# Patient Record
Sex: Female | Born: 1940 | Race: White | Hispanic: No | Smoking: Never smoker
Health system: Southern US, Community
[De-identification: ages and names within clinical notes are randomized; demographics above are authoritative.]

## PROBLEM LIST (undated history)

## (undated) DIAGNOSIS — H9192 Unspecified hearing loss, left ear: Secondary | ICD-10-CM

## (undated) DIAGNOSIS — I10 Essential (primary) hypertension: Secondary | ICD-10-CM

## (undated) DIAGNOSIS — Z87442 Personal history of urinary calculi: Secondary | ICD-10-CM

## (undated) DIAGNOSIS — D696 Thrombocytopenia, unspecified: Secondary | ICD-10-CM

## (undated) DIAGNOSIS — N9489 Other specified conditions associated with female genital organs and menstrual cycle: Secondary | ICD-10-CM

## (undated) DIAGNOSIS — M1990 Unspecified osteoarthritis, unspecified site: Secondary | ICD-10-CM

## (undated) DIAGNOSIS — Z853 Personal history of malignant neoplasm of breast: Secondary | ICD-10-CM

## (undated) DIAGNOSIS — C50919 Malignant neoplasm of unspecified site of unspecified female breast: Secondary | ICD-10-CM

## (undated) DIAGNOSIS — R42 Dizziness and giddiness: Secondary | ICD-10-CM

## (undated) DIAGNOSIS — C569 Malignant neoplasm of unspecified ovary: Secondary | ICD-10-CM

## (undated) DIAGNOSIS — R200 Anesthesia of skin: Secondary | ICD-10-CM

## (undated) DIAGNOSIS — C801 Malignant (primary) neoplasm, unspecified: Secondary | ICD-10-CM

## (undated) DIAGNOSIS — E785 Hyperlipidemia, unspecified: Secondary | ICD-10-CM

## (undated) DIAGNOSIS — C786 Secondary malignant neoplasm of retroperitoneum and peritoneum: Secondary | ICD-10-CM

## (undated) DIAGNOSIS — D649 Anemia, unspecified: Secondary | ICD-10-CM

## (undated) DIAGNOSIS — E079 Disorder of thyroid, unspecified: Secondary | ICD-10-CM

## (undated) DIAGNOSIS — IMO0002 Reserved for concepts with insufficient information to code with codable children: Secondary | ICD-10-CM

## (undated) DIAGNOSIS — I739 Peripheral vascular disease, unspecified: Secondary | ICD-10-CM

## (undated) HISTORY — DX: Reserved for concepts with insufficient information to code with codable children: IMO0002

## (undated) HISTORY — DX: Personal history of malignant neoplasm of breast: Z85.3

## (undated) HISTORY — PX: MASTECTOMY: SHX3

## (undated) HISTORY — PX: FOOT SURGERY: SHX648

## (undated) HISTORY — PX: VASCULAR SURGERY: SHX849

## (undated) HISTORY — PX: OTHER SURGICAL HISTORY: SHX169

## (undated) HISTORY — PX: VARICOSE VEIN SURGERY: SHX832

## (undated) HISTORY — PX: THYROID LOBECTOMY: SHX420

## (undated) HISTORY — PX: TONSILLECTOMY: SUR1361

---

## 1978-03-07 HISTORY — PX: ABDOMINAL HYSTERECTOMY: SHX81

## 2004-05-18 ENCOUNTER — Ambulatory Visit: Payer: Self-pay | Admitting: Occupational Therapy

## 2006-10-21 ENCOUNTER — Inpatient Hospital Stay (HOSPITAL_COMMUNITY): Admission: EM | Admit: 2006-10-21 | Discharge: 2006-10-22 | Payer: Self-pay | Admitting: Emergency Medicine

## 2007-10-23 ENCOUNTER — Ambulatory Visit (HOSPITAL_COMMUNITY): Admission: RE | Admit: 2007-10-23 | Discharge: 2007-10-23 | Payer: Self-pay | Admitting: Internal Medicine

## 2010-07-20 NOTE — H&P (Signed)
NAME:  Rita Humphrey, Rita Humphrey NO.:  [id number]   MEDICAL RECORD NO.:  [id number]          PATIENT TYPE:  INP   LOCATION:  A201                          FACILITY:  [facility]   PHYSICIAN:  Skeet Latch, DO    DATE OF BIRTH:  01/14/1941   DATE OF ADMISSION:  10/21/2006  DATE OF DISCHARGE:  LH                              HISTORY & PHYSICAL   PRIMARY CARE PHYSICIAN:  Dr. Ignacia Palma, [place], [place].   CHIEF COMPLAINT:  Chest pain.   HISTORY OF PRESENT ILLNESS:  This 70 year old occasion female presents  with the sudden onset of acute left sternal chest pain.  The patient  states that this morning while feeding her dog and giving him water, she  bent been down. After standing back up, she had acute onset of  substernal 10/10 chest pain.  The patient states she felt like the pain  radiated into her left shoulder blade.  The patient states that after  this episode, the patient went back in the house, sat down, and took 2  aspirin, her blood pressure medication as well as cholesterol  medications.  The patient states that the pain continued.  She called  her sister-in-law who came to her house.  She gave her 2 nitroglycerin  tablets and states that this helped relieve her pain.  Then EMS was  called.  The patient was brought to the emergency room for evaluation.  Upon my exam, the patient states the left sternal chest pain has  subsided but continues to have left-sided shoulder and scapular pain.  The patient does have a history of chronic back pain that she states has  been present for months has been treated with muscle relaxants.  The  patient's medical history consists of hypertension and hyperlipidemia  and breast cancer.  Patient is stable.  No acute distress at this time  and is lying in bed on my exam.   PAST MEDICAL HISTORY:  Includes:  1. Breast cancer with chemotherapy, last treatment was in 1994.  2. Hypertension.  3. Hyperlipidemia.   SURGICAL HISTORY:  1.  Hysterectomy.  2. Bilateral mastectomy.  3. Tonsillectomy.  4. Leg vein stripping.   SOCIAL HISTORY:  Denies any smoking tobacco, illicit drug use.   FAMILY HISTORY:  Positive for breast cancer, myocardial infarctions,  coronary artery disease, and CVA.   DRUG ALLERGIES:  MORPHINE, PENICILLIN.   HOME MEDICATIONS:  1. Lisinopril/hydrochlorothiazide 25/12.5 mg 1 tablet p.o. daily.  2. Simvastatin 40 mg 1 tablet p.o. nightly.   REVIEW OF SYSTEMS:  GENERAL:  Denies any weight loss, weakness,  decreased appetite, chills, fatigue.  HEENT AND NECK: Denies any eye  disorders, head, neck,  or ear complaints.  CARDIOVASCULAR:  Positive  for left-sided chest pain, no palpitations.  RESPIRATORY:  Denies any  cough, dyspnea, wheezing.  GI: No nausea, vomiting, abdominal pain,  diarrhea, black stools or rectal bleeding.  GENITOURINARY: No dysuria,  urgency, frequency.  MUSCULOSKELETAL:  Chronic back pain.  No neck pain.  SKIN:  Negative. Other systems are negative.   PHYSICAL EXAMINATION:  GENERAL:  Patient is well-developed, well-  nourished, well-hydrated, no acute distress, alert and oriented.  VITAL SIGNS: Temperature is 98.3, respiration rate 18, pulse 85, blood  pressure 144-69.  HEENT:  Head is atraumatic, normocephalic.  PERRLA.  EOMI.  NECK:  Supple.  Slight prominence of the left thyroid gland. No bruits  appreciated.  CARDIOVASCULAR:  Regular rate and rhythm.  No murmurs, gallops or rubs.  RESPIRATORY:  Lungs clear to auscultation bilaterally.  No rales,  rhonchi or wheezes.  ABDOMEN:  Soft, nontender, nondistended, obese, no masses, no rebound or  guarding.  Positive bowel sounds.  EXTREMITIES:  Full range of motion.  No clubbing, cyanosis or edema.  NEUROLOGIC:  Cranial nerves II-XII are grossly intact.  The patient is  alert and oriented x3.   LABORATORY DATA:  Sodium 140, potassium 3.6, chloride 108, CO2 29,  glucose 116, BUN 16, creatinine 0.86, troponin less than 0.05.   CK-MB is  less than 1. White count 8.5, hemoglobin 14.1, hematocrit 41.2,  platelets 334.   EKG:  Normal sinus rhythm, normal axis, no ST or T wave changes.   CT scan was negative for dissection, hematoma, or aneurysm.  Shows areas  of arthrosclerosis in the aorta.  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  Consider followup with ultrasound.  Also showed fatty liver.  A small  focus of ground glass in right lower lobe, probably atelectasis or could  be inflammation or infection.   ASSESSMENT:  This is a 66-year white female who presented with acute  onset of left sternal chest pain.  The patient states while being  outside she experienced left-sided chest pain that she states feels like  radiated to her left scapular region.  The patient states that the chest  pain has subsided, but she still complaints of left scapular type  discomfort.   PLAN:  1. For atypical chest pain, the patient will be placed on aspirin and      nitroglycerin as needed, oxygen and repeat EKG in the morning. The      patient will get a set of cardiac enzymes as well as cardiology      consult.  The patient be admitted to the telemetry unit for close      observation.  2. For hypertension, this seems to be stable.  Continue home      medications.  3. For hyperlipidemia, the patient is already on a statin.  This be      continued and will check a lipid panel.  4. For chronic back pain, the patient will be placed on p.o. pain      medications.  If severe, will be given IV pain medications.  5. For history of lightheadedness and dizziness, will get carotid      Doppler studies.      Skeet Latch, DO  Electronically Signed     SM/MEDQ  D:  10/21/2006  T:  10/21/2006  Job:  [id number]   cc:   Dr. Beckey Rutter, [place]

## 2010-07-20 NOTE — Discharge Summary (Signed)
NAME:  Rita Humphrey, Rita Humphrey                  ACCOUNT NO.:  [id number]   MEDICAL RECORD NO.:  [id number]          PATIENT TYPE:  INP   LOCATION:  A201                          FACILITY:  [facility]   PHYSICIAN:  Osvaldo Shipper, MD     DATE OF BIRTH:  October 25, 1940   DATE OF ADMISSION:  10/21/2006  DATE OF DISCHARGE:  08/17/2008LH                               DISCHARGE SUMMARY   PRIMARY CARE PHYSICIAN:  Carlota Raspberry, M.D.   DISCHARGE DIAGNOSES:  1. Chest pain, likely musculoskeletal.  2. History of hypertension, stable.  3. History of breast cancer, with bilateral mastectomies in the past.  4. History of dyslipidemia.   Please review  H&P dictated by Dr. Lewis Moccasin for details regarding the  patient's presenting illness.   BRIEF HOSPITAL COURSE:  Briefly, this is a 70 year old Caucasian female  who has a history of hypertension and dyslipidemia, who was at home  yesterday morning when she bent down to serve food to her dog, and when  she stood up from the bent position she experienced sudden, sharp chest  pain in the left side.  The patient has a chronic history of back pain,  and she felt back pain as well.  This prompted her to visit her urgent  care center, who referred her to the hospital.  She presented to the  hospital here, underwent a CT scan of the chest and abdomen, which did  not show any dissection or any other problems with her major arteries.  She had an EKG which did not show any acute changes.  Cardiac markers  were negative when she presented, and they were checked again this  morning and were again negative.  BN peptide was also negative.  The  rest of the labs were unremarkable.   Her pain actually resolved with 2 nitroglycerin, which she took from her  sister's supply.   So, essentially the etiology for her pain is not really clear.  She did  mention that she had a cardiac stress test about 8-9 months ago, which  was negative.  Considering that data, cardiac etiology is less  likely.  I would favor more musculoskeletal or acid reflux disease as being the  etiology.  In any case, her pain resolved.  She has ruled out for acute  coronary syndrome.  The remainder of the workup can be done by her PMD  at this point.   Incidentally, on the chest CT she was found to have a 3.5 x 2 cm  enlarged left thyroid gland.  A thyroid ultrasound was recommended and I  am going to write the prescription for the same.  She also was found to  have a 2-3 mm nodular in the right lower lobe, for which we would  recommend a 90-month to 1-year follow-up.  Otherwise, she has done well.  Her vital signs are stable.  She is tolerating p.o. intake.  She is very  keen on going home, and so she will be discharged.   DISCHARGE MEDICATIONS:  1. Enteric-coated aspirin 81 mg daily.  2. Lisinopril/HCTZ 20/12.5 mg one  tablet daily.  3. Simvastatin 40 mg daily.   FOLLOWUP:  With her PMD within one week, to see if she needs another  referral to a cardiologist.  She will be asked to schedule the  ultrasound of the thyroid for goiter; she will be written a prescription  for the same.  Instructions written for her to inform the PMD to set her  up for a CAT scan in 6 months to one year of the lungs.   DIET:  Heart healthy diet.   PHYSICAL ACTIVITY:  No restrictions.   TOTAL TIME SPENT ON DISCHARGE:  35 minutes.      Osvaldo Shipper, MD  Electronically Signed     GK/MEDQ  D:  10/22/2006  T:  10/22/2006  Job:  [id number]   cc:   Carlota Raspberry  Fax: [phone]

## 2010-12-17 LAB — CBC
HCT: 41.2
Hemoglobin: 13.1
MCHC: 33.3
MCV: 85.2
RBC: 4.84
RDW: 13.7
WBC: 8.5

## 2010-12-17 LAB — DIFFERENTIAL
Basophils Absolute: 0
Basophils Relative: 0
Eosinophils Absolute: 0.3
Eosinophils Absolute: 0.4
Eosinophils Relative: 3
Lymphs Abs: 3.1
Monocytes Absolute: 0.5
Monocytes Absolute: 0.8 — ABNORMAL HIGH
Monocytes Relative: 6
Neutro Abs: 4.1
Neutrophils Relative %: 47

## 2010-12-17 LAB — BASIC METABOLIC PANEL
BUN: 16
CO2: 25
Calcium: 9.4
Chloride: 108
Creatinine, Ser: 0.74
GFR calc Af Amer: >60
Glucose, Bld: 96
Potassium: 3.6

## 2010-12-17 LAB — LIPID PANEL
HDL: 46
LDL Cholesterol: 87
Triglycerides: 71

## 2010-12-17 LAB — CARDIAC PANEL(CRET KIN+CKTOT+MB+TROPI): Troponin I: 0.02

## 2010-12-17 LAB — B-NATRIURETIC PEPTIDE (CONVERTED LAB): Pro B Natriuretic peptide (BNP): 33.4

## 2010-12-17 LAB — PROTIME-INR: Prothrombin Time: 12.7

## 2010-12-17 LAB — POCT CARDIAC MARKERS: Troponin i, poc: <0.05

## 2011-03-08 DIAGNOSIS — I739 Peripheral vascular disease, unspecified: Secondary | ICD-10-CM

## 2011-03-08 HISTORY — DX: Peripheral vascular disease, unspecified: I73.9

## 2011-07-10 ENCOUNTER — Emergency Department (HOSPITAL_COMMUNITY)
Admission: EM | Admit: 2011-07-10 | Discharge: 2011-07-10 | Disposition: A | Discharge status: Home / Self Care | Payer: Medicare Other | Attending: Emergency Medicine | Admitting: Emergency Medicine

## 2011-07-10 ENCOUNTER — Encounter (HOSPITAL_COMMUNITY): Payer: Self-pay | Admitting: Emergency Medicine

## 2011-07-10 DIAGNOSIS — Z853 Personal history of malignant neoplasm of breast: Secondary | ICD-10-CM | POA: Insufficient documentation

## 2011-07-10 DIAGNOSIS — E785 Hyperlipidemia, unspecified: Secondary | ICD-10-CM | POA: Insufficient documentation

## 2011-07-10 DIAGNOSIS — E079 Disorder of thyroid, unspecified: Secondary | ICD-10-CM | POA: Insufficient documentation

## 2011-07-10 DIAGNOSIS — J45909 Unspecified asthma, uncomplicated: Secondary | ICD-10-CM | POA: Insufficient documentation

## 2011-07-10 DIAGNOSIS — M79661 Pain in right lower leg: Secondary | ICD-10-CM

## 2011-07-10 DIAGNOSIS — M7989 Other specified soft tissue disorders: Secondary | ICD-10-CM | POA: Insufficient documentation

## 2011-07-10 DIAGNOSIS — I509 Heart failure, unspecified: Secondary | ICD-10-CM | POA: Insufficient documentation

## 2011-07-10 DIAGNOSIS — I10 Essential (primary) hypertension: Secondary | ICD-10-CM | POA: Insufficient documentation

## 2011-07-10 HISTORY — DX: Malignant (primary) neoplasm, unspecified: C80.1

## 2011-07-10 HISTORY — DX: Disorder of thyroid, unspecified: E07.9

## 2011-07-10 HISTORY — DX: Hyperlipidemia, unspecified: E78.5

## 2011-07-10 HISTORY — DX: Essential (primary) hypertension: I10

## 2011-07-10 MED ORDER — ENOXAPARIN SODIUM 30 MG/0.3ML ~~LOC~~ SOLN
SUBCUTANEOUS | Status: AC
  Administered 2011-07-10: 30 mg via SUBCUTANEOUS
  Filled 2011-07-10: qty 0.3

## 2011-07-10 MED ORDER — ENOXAPARIN SODIUM 60 MG/0.6ML ~~LOC~~ SOLN
1.0000 mg/kg | Freq: Once | SUBCUTANEOUS | Status: AC
  Administered 2011-07-10: 90 mg via SUBCUTANEOUS

## 2011-07-10 MED ORDER — ENOXAPARIN SODIUM 60 MG/0.6ML ~~LOC~~ SOLN
SUBCUTANEOUS | Status: AC
  Filled 2011-07-10: qty 0.6

## 2011-07-10 NOTE — ED Provider Notes (Signed)
History   This chart was scribed for Laray Anger, DO by Brooks Sailors. The patient was seen in room APA17/APA17.   CSN: [id number]  Arrival date & time 07/10/11  2048   First MD Initiated Contact with Patient 07/10/11 2106      Chief Complaint  Patient presents with  . Leg Swelling     HPI Pt seen at 2115.  Per pt, c/o gradual onset and persistence of constant right lower leg "swelling" and "pain" since yesterday.  States the pain started in her lower posterior leg, and "moved" to her lower medial leg.  Describes the pain as "throbbing."  Pt states she has a hx of "blood clots in my leg," one approx 40 years ago and another approx 25-30 years ago. Patient states she was not treated with "blood thinners" either episode.  Denies fever, no rash, no abd pain, no back pain, no CP/SOB, no focal motor weakness, no tingling/numbness in extremities.     Past Medical History  Diagnosis Date  . Cancer     [place]  . Hypertension   . Hyperlipemia   . CHF (congestive heart failure)   . Thyroid disease   . Asthma     Past Surgical History  Procedure Date  . Abdominal hysterectomy   . Mastectomy   . Vascular surgery   . Thyroid lobectomy     History  Substance Use Topics  . Smoking status: Never Smoker   . Smokeless tobacco: Not on file  . Alcohol Use: No    Review of Systems ROS: Statement: All systems negative except as marked or noted in the HPI; Constitutional: Negative for fever and chills. ; ; Eyes: Negative for eye pain, redness and discharge. ; ; ENMT: Negative for ear pain, hoarseness, nasal congestion, sinus pressure and sore throat. ; ; Cardiovascular: Negative for chest pain, palpitations, diaphoresis, dyspnea and peripheral edema. ; ; Respiratory: Negative for cough, wheezing and stridor. ; ; Gastrointestinal: Negative for nausea, vomiting, diarrhea, abdominal pain, blood in stool, hematemesis, jaundice and rectal bleeding. . ; ; Genitourinary: Negative for dysuria,  flank pain and hematuria. ; ; Musculoskeletal: Negative for back pain and neck pain. Negative for trauma. +RLE pain ; Skin: Negative for pruritus, rash, abrasions, blisters, bruising and skin lesion.; ; Neuro: Negative for headache, lightheadedness and neck stiffness. Negative for weakness, altered level of consciousness , altered mental status, extremity weakness, paresthesias, involuntary movement, seizure and syncope.     Allergies  Penicillins and Morphine and related  Home Medications  No current outpatient prescriptions on file.  BP 155/78  Pulse 103  Temp(Src) 98.2 F (36.8 C) (Oral)  Resp 20  Ht 5\' 3"  (1.6 m)  Wt 205 lb (92.987 kg)  BMI 36.31 kg/m2  SpO2 95%  Physical Exam 2120: Physical examination:  Nursing notes reviewed; Vital signs and O2 SAT reviewed;  Constitutional: Well developed, Well nourished, Well hydrated, In no acute distress; Head:  Normocephalic, atraumatic; Eyes: EOMI, PERRL, No scleral icterus; ENMT: Mouth and pharynx normal, Mucous membranes moist; Neck: Supple, Full range of motion, No lymphadenopathy; Cardiovascular: Regular rate and rhythm, No murmur, rub, or gallop; Respiratory: Breath sounds clear & equal bilaterally, No rales, rhonchi, wheezes, or rub, Normal respiratory effort/excursion; Chest: Nontender, Movement normal; Abdomen: Soft, Nontender, Nondistended, Normal bowel sounds; Extremities: Pulses normal, No tenderness, 1+ pedal edema bilat without calf edema. +multiple superficial varicose veins RLE without overlying erythema or edema.; Neuro: AA&Ox3, Major CN grossly intact. Gait steady. No gross focal  motor or sensory deficits in extremities.; Skin: Color normal, Warm, Dry, no rash.    ED Course  Procedures    MDM  MDM Reviewed: nursing note, vitals and previous chart      2145:  Pt does not describe taking any type of blood thinner after her reportedly last "2 blood clots in my legs" in the 1970's and 1980's.  States she "had my veins  stripped."  Appears to be superficial varicosity she is pointing to today.  No overlying erythema, ecchymosis or open wounds.  Will dose lovenox and obtain Vasc US in the morning to r/o DVT.  Long d/w family regarding potential dx, interim treatment, and dx testing.  Questions answered.  Verb understanding, agreeable to d/c home with outpt f/u tomorrow morning.      I personally performed the services described in this documentation, which was scribed in my presence. The recorded information has been reviewed and considered. Tipton Ballow Allison Quarry, DO 07/12/11 1604

## 2011-07-10 NOTE — ED Notes (Signed)
Pt with right leg swelling since last night.  Pt has had DVT's previously.  Pt with swelling and states it feels hot on her lower calf

## 2011-07-10 NOTE — ED Notes (Signed)
Radiology Tech in to discuss appointment time with patient for ultrasound tomorrow.

## 2011-07-11 ENCOUNTER — Ambulatory Visit (HOSPITAL_COMMUNITY)
Admit: 2011-07-11 | Discharge: 2011-07-11 | Disposition: A | Discharge status: Home / Self Care | Payer: Medicare Other | Source: Ambulatory Visit | Attending: Emergency Medicine | Admitting: Emergency Medicine

## 2011-07-11 DIAGNOSIS — I82819 Embolism and thrombosis of superficial veins of unspecified lower extremities: Secondary | ICD-10-CM | POA: Insufficient documentation

## 2011-07-11 DIAGNOSIS — M79609 Pain in unspecified limb: Secondary | ICD-10-CM | POA: Insufficient documentation

## 2011-07-11 DIAGNOSIS — M7989 Other specified soft tissue disorders: Secondary | ICD-10-CM | POA: Insufficient documentation

## 2012-05-14 ENCOUNTER — Encounter (HOSPITAL_COMMUNITY): Payer: Self-pay

## 2012-05-14 ENCOUNTER — Inpatient Hospital Stay (HOSPITAL_COMMUNITY)
Admission: EM | Admit: 2012-05-14 | Discharge: 2012-05-17 | DRG: 375 | Disposition: A | Discharge status: Home / Self Care | Payer: Medicare Other | Attending: Internal Medicine | Admitting: Internal Medicine

## 2012-05-14 ENCOUNTER — Emergency Department (HOSPITAL_COMMUNITY): Payer: Medicare Other

## 2012-05-14 ENCOUNTER — Inpatient Hospital Stay (HOSPITAL_COMMUNITY): Payer: Medicare Other

## 2012-05-14 DIAGNOSIS — R109 Unspecified abdominal pain: Secondary | ICD-10-CM | POA: Diagnosis present

## 2012-05-14 DIAGNOSIS — M545 Low back pain, unspecified: Secondary | ICD-10-CM | POA: Diagnosis present

## 2012-05-14 DIAGNOSIS — Z880 Allergy status to penicillin: Secondary | ICD-10-CM

## 2012-05-14 DIAGNOSIS — E785 Hyperlipidemia, unspecified: Secondary | ICD-10-CM | POA: Diagnosis present

## 2012-05-14 DIAGNOSIS — D75839 Thrombocytosis, unspecified: Secondary | ICD-10-CM | POA: Diagnosis present

## 2012-05-14 DIAGNOSIS — C482 Malignant neoplasm of peritoneum, unspecified: Principal | ICD-10-CM | POA: Diagnosis present

## 2012-05-14 DIAGNOSIS — N838 Other noninflammatory disorders of ovary, fallopian tube and broad ligament: Secondary | ICD-10-CM

## 2012-05-14 DIAGNOSIS — N9489 Other specified conditions associated with female genital organs and menstrual cycle: Secondary | ICD-10-CM | POA: Diagnosis present

## 2012-05-14 DIAGNOSIS — Z9010 Acquired absence of unspecified breast and nipple: Secondary | ICD-10-CM

## 2012-05-14 DIAGNOSIS — D473 Essential (hemorrhagic) thrombocythemia: Secondary | ICD-10-CM | POA: Diagnosis present

## 2012-05-14 DIAGNOSIS — J45909 Unspecified asthma, uncomplicated: Secondary | ICD-10-CM | POA: Diagnosis present

## 2012-05-14 DIAGNOSIS — M542 Cervicalgia: Secondary | ICD-10-CM | POA: Diagnosis present

## 2012-05-14 DIAGNOSIS — C786 Secondary malignant neoplasm of retroperitoneum and peritoneum: Secondary | ICD-10-CM | POA: Diagnosis present

## 2012-05-14 DIAGNOSIS — C7960 Secondary malignant neoplasm of unspecified ovary: Secondary | ICD-10-CM | POA: Diagnosis present

## 2012-05-14 DIAGNOSIS — E669 Obesity, unspecified: Secondary | ICD-10-CM | POA: Diagnosis present

## 2012-05-14 DIAGNOSIS — I10 Essential (primary) hypertension: Secondary | ICD-10-CM | POA: Diagnosis present

## 2012-05-14 DIAGNOSIS — Z853 Personal history of malignant neoplasm of breast: Secondary | ICD-10-CM

## 2012-05-14 DIAGNOSIS — Z6832 Body mass index (BMI) 32.0-32.9, adult: Secondary | ICD-10-CM

## 2012-05-14 DIAGNOSIS — Z79899 Other long term (current) drug therapy: Secondary | ICD-10-CM

## 2012-05-14 DIAGNOSIS — N839 Noninflammatory disorder of ovary, fallopian tube and broad ligament, unspecified: Secondary | ICD-10-CM

## 2012-05-14 DIAGNOSIS — Z809 Family history of malignant neoplasm, unspecified: Secondary | ICD-10-CM

## 2012-05-14 DIAGNOSIS — K5900 Constipation, unspecified: Secondary | ICD-10-CM | POA: Diagnosis present

## 2012-05-14 DIAGNOSIS — Z8249 Family history of ischemic heart disease and other diseases of the circulatory system: Secondary | ICD-10-CM

## 2012-05-14 DIAGNOSIS — Z9221 Personal history of antineoplastic chemotherapy: Secondary | ICD-10-CM

## 2012-05-14 DIAGNOSIS — R188 Other ascites: Secondary | ICD-10-CM | POA: Diagnosis present

## 2012-05-14 DIAGNOSIS — Z823 Family history of stroke: Secondary | ICD-10-CM

## 2012-05-14 DIAGNOSIS — C569 Malignant neoplasm of unspecified ovary: Secondary | ICD-10-CM | POA: Diagnosis present

## 2012-05-14 DIAGNOSIS — I509 Heart failure, unspecified: Secondary | ICD-10-CM | POA: Diagnosis present

## 2012-05-14 DIAGNOSIS — Z885 Allergy status to narcotic agent status: Secondary | ICD-10-CM

## 2012-05-14 DIAGNOSIS — R180 Malignant ascites: Secondary | ICD-10-CM | POA: Diagnosis present

## 2012-05-14 DIAGNOSIS — E876 Hypokalemia: Secondary | ICD-10-CM | POA: Diagnosis present

## 2012-05-14 DIAGNOSIS — G893 Neoplasm related pain (acute) (chronic): Secondary | ICD-10-CM | POA: Diagnosis present

## 2012-05-14 HISTORY — DX: Malignant neoplasm of unspecified site of unspecified female breast: C50.919

## 2012-05-14 HISTORY — DX: Malignant neoplasm of unspecified ovary: C56.9

## 2012-05-14 HISTORY — DX: Secondary malignant neoplasm of retroperitoneum and peritoneum: C78.6

## 2012-05-14 HISTORY — DX: Other specified conditions associated with female genital organs and menstrual cycle: N94.89

## 2012-05-14 LAB — COMPREHENSIVE METABOLIC PANEL
ALT: 16 U/L (ref 0–35)
AST: 15 U/L (ref 0–37)
Calcium: 10 mg/dL (ref 8.4–10.5)
Creatinine, Ser: 0.75 mg/dL (ref 0.50–1.10)
GFR calc non Af Amer: 83 mL/min — ABNORMAL LOW (ref >90)
Sodium: 137 mEq/L (ref 135–145)
Total Protein: 7.8 g/dL (ref 6.0–8.3)

## 2012-05-14 LAB — URINALYSIS, ROUTINE W REFLEX MICROSCOPIC
Glucose, UA: NEGATIVE mg/dL
Hgb urine dipstick: NEGATIVE
Leukocytes, UA: NEGATIVE
Protein, ur: NEGATIVE mg/dL
Specific Gravity, Urine: 1.01 (ref 1.005–1.030)
Urobilinogen, UA: 0.2 mg/dL (ref 0.0–1.0)

## 2012-05-14 LAB — CBC WITH DIFFERENTIAL/PLATELET
Basophils Absolute: 0 10*3/uL (ref 0.0–0.1)
Basophils Relative: 0 % (ref 0–1)
Eosinophils Absolute: 0.1 10*3/uL (ref 0.0–0.7)
Eosinophils Relative: 2 % (ref 0–5)
HCT: 42.2 % (ref 36.0–46.0)
MCH: 26.5 pg (ref 26.0–34.0)
MCHC: 32 g/dL (ref 30.0–36.0)
MCV: 82.9 fL (ref 78.0–100.0)
Monocytes Absolute: 0.7 10*3/uL (ref 0.1–1.0)
Platelets: 490 10*3/uL — ABNORMAL HIGH (ref 150–400)
RDW: 13.8 % (ref 11.5–15.5)
WBC: 9.3 10*3/uL (ref 4.0–10.5)

## 2012-05-14 MED ORDER — SODIUM CHLORIDE 0.9 % IV SOLN
INTRAVENOUS | Status: DC
  Administered 2012-05-14: 22:00:00 via INTRAVENOUS
  Administered 2012-05-14: 50 mL/h via INTRAVENOUS
  Administered 2012-05-15 – 2012-05-16 (×2): via INTRAVENOUS

## 2012-05-14 MED ORDER — ONDANSETRON HCL 4 MG/2ML IJ SOLN
4.0000 mg | Freq: Once | INTRAMUSCULAR | Status: AC
  Administered 2012-05-14: 4 mg via INTRAVENOUS
  Filled 2012-05-14: qty 2

## 2012-05-14 MED ORDER — FENTANYL CITRATE 0.05 MG/ML IJ SOLN
50.0000 ug | Freq: Once | INTRAMUSCULAR | Status: AC
  Administered 2012-05-14: 50 ug via INTRAVENOUS
  Filled 2012-05-14: qty 2

## 2012-05-14 MED ORDER — HYDROMORPHONE HCL PF 1 MG/ML IJ SOLN
0.5000 mg | INTRAMUSCULAR | Status: DC | PRN
  Administered 2012-05-15 – 2012-05-16 (×2): 0.5 mg via INTRAVENOUS
  Filled 2012-05-14 (×2): qty 1

## 2012-05-14 MED ORDER — SIMVASTATIN 10 MG PO TABS
5.0000 mg | ORAL_TABLET | Freq: Every day | ORAL | Status: DC
  Administered 2012-05-14 – 2012-05-16 (×3): 5 mg via ORAL
  Filled 2012-05-14: qty 2
  Filled 2012-05-14 (×3): qty 1

## 2012-05-14 MED ORDER — SODIUM CHLORIDE 0.9 % IV SOLN
1000.0000 mL | Freq: Once | INTRAVENOUS | Status: AC
  Administered 2012-05-14: 1000 mL via INTRAVENOUS

## 2012-05-14 MED ORDER — ONDANSETRON HCL 4 MG/2ML IJ SOLN: 4.0000 mg | Freq: Four times a day (QID) | INTRAMUSCULAR | Status: AC | PRN

## 2012-05-14 MED ORDER — SODIUM CHLORIDE 0.9 % IV SOLN: 250.0000 mL | INTRAVENOUS | Status: DC | PRN

## 2012-05-14 MED ORDER — SODIUM CHLORIDE 0.9 % IV SOLN: 1000.0000 mL | INTRAVENOUS | Status: DC

## 2012-05-14 MED ORDER — IOHEXOL 300 MG/ML  SOLN
100.0000 mL | Freq: Once | INTRAMUSCULAR | Status: AC | PRN
  Administered 2012-05-14: 100 mL via INTRAVENOUS

## 2012-05-14 MED ORDER — IOHEXOL 300 MG/ML  SOLN
50.0000 mL | Freq: Once | INTRAMUSCULAR | Status: AC | PRN
  Administered 2012-05-14: 50 mL via ORAL

## 2012-05-14 MED ORDER — ACETAMINOPHEN 500 MG PO TABS
1000.0000 mg | ORAL_TABLET | Freq: Four times a day (QID) | ORAL | Status: DC | PRN
  Administered 2012-05-14 – 2012-05-16 (×2): 1000 mg via ORAL
  Filled 2012-05-14 (×2): qty 2

## 2012-05-14 MED ORDER — ENOXAPARIN SODIUM 40 MG/0.4ML ~~LOC~~ SOLN
40.0000 mg | SUBCUTANEOUS | Status: DC
  Administered 2012-05-14 – 2012-05-16 (×3): 40 mg via SUBCUTANEOUS
  Filled 2012-05-14 (×3): qty 0.4

## 2012-05-14 MED ORDER — HYDROCHLOROTHIAZIDE 12.5 MG PO CAPS
12.5000 mg | ORAL_CAPSULE | Freq: Every day | ORAL | Status: DC
  Filled 2012-05-14: qty 1

## 2012-05-14 MED ORDER — SODIUM CHLORIDE 0.9 % IJ SOLN: 3.0000 mL | INTRAMUSCULAR | Status: DC | PRN

## 2012-05-14 MED ORDER — FENTANYL CITRATE 0.05 MG/ML IJ SOLN: 50.0000 ug | INTRAMUSCULAR | Status: DC | PRN

## 2012-05-14 MED ORDER — SODIUM CHLORIDE 0.9 % IJ SOLN
3.0000 mL | Freq: Two times a day (BID) | INTRAMUSCULAR | Status: DC
  Administered 2012-05-15 – 2012-05-17 (×3): 3 mL via INTRAVENOUS
  Filled 2012-05-14: qty 3

## 2012-05-14 MED ORDER — AMLODIPINE BESYLATE 5 MG PO TABS
10.0000 mg | ORAL_TABLET | Freq: Every day | ORAL | Status: DC
  Administered 2012-05-15 – 2012-05-17 (×3): 10 mg via ORAL
  Filled 2012-05-14 (×3): qty 2

## 2012-05-14 NOTE — ED Notes (Signed)
Complain of pain in lower back and stomach for two months. Denies n/v/d or fever

## 2012-05-14 NOTE — ED Provider Notes (Signed)
History    This chart was scribed for Imad Shostak Givens, MD by Charolett Bumpers, ED Scribe. The patient was seen in room APA07/APA07. Patient's care was started at 0947.   CSN: [id number]  Arrival date & time 05/14/12  0865   First MD Initiated Contact with Patient 05/14/12 [phone]      Chief Complaint  Patient presents with  . Back Pain    The history is provided by the patient. No language interpreter was used.   Rita Humphrey is a 72 y.o. female who presents to the Emergency Department complaining of intermittent, lower abdominal pain that started around November 2013. She states the episodes last 30 minutes at a time and occur multiple times daily. She reports her abdomen is sore with palpation. Her pain started after painting a room and stepped off a stool and felt something pull. She moved the furniture to paint by herself. She reports the pain is a sharp, stinging pain and is aggravated with laying on either of her sides. She states that after trying to get in with her PCP over the past 4 months, she has been seen by his NP in Feb and only had an UA that was normal. She denies any nausea, vomiting, diarrhea, fevers, dysuria, increased urinary frequency, urinary incontinence. She denies any decreased appetite and denies any changes in her pain with eating. She reports losing 10 lbs since November 2013. She also complains of lower back pain.   She has a h/o bilateral breast CA (1993, 2003), has not been followed by oncologist for the past 10 years. States they were two different types of breast cancer, the first she had chemotherapy, the second just surgery.   PCP: Dr. Ignacia Palma in [place]  Past Medical History  Diagnosis Date  . Cancer     [place]  . Hypertension   . Hyperlipemia   . CHF (congestive heart failure)   . Thyroid disease   . Asthma     Past Surgical History  Procedure Laterality Date  . Abdominal hysterectomy    . Mastectomy    . Vascular surgery    . Thyroid  lobectomy     bladder tack  No family history on file.  History  Substance Use Topics  . Smoking status: Never Smoker   . Smokeless tobacco: Not on file  . Alcohol Use: No  She smoking. She denies alcohol use.  Lives at home Lives alone  OB History   Grav Para Term Preterm Abortions TAB SAB Ect Mult Living                  Review of Systems  Constitutional: Negative for fever.  Gastrointestinal: Positive for abdominal pain. Negative for nausea, vomiting and diarrhea.  Genitourinary: Negative for dysuria and frequency.  Musculoskeletal: Positive for back pain.  All other systems reviewed and are negative.    Allergies  Penicillins and Morphine and related  Home Medications   Current Outpatient Rx  Name  Route  Sig  Dispense  Refill  . acetaminophen (TYLENOL) 500 MG tablet   Oral   Take 1,000 mg by mouth every 6 (six) hours as needed for pain.         Marland Kitchen amLODipine (NORVASC) 10 MG tablet   Oral   Take 10 mg by mouth daily.         . Cyanocobalamin (VITAMIN B 12 PO)   Oral   Take 1 tablet by mouth daily.         Marland Kitchen  hydrochlorothiazide (HYDRODIURIL) 25 MG tablet   Oral   Take 12.5 mg by mouth daily.         . Multiple Vitamins-Minerals (CENTRUM SILVER ADULT 50+ PO)   Oral   Take 1 tablet by mouth daily.         . pravastatin (PRAVACHOL) 20 MG tablet   Oral   Take 20 mg by mouth at bedtime.           Triage Vitals: BP 138/84  Pulse 105  Temp(Src) 98 F (36.7 C) (Oral)  Resp 18  Ht 5\' 3"  (1.6 m)  Wt 185 lb (83.915 kg)  BMI 32.78 kg/m2  SpO2 99%  Vital signs normal except for tachycardia   Physical Exam  Constitutional: She is oriented to person, place, and time. She appears well-developed and well-nourished.  HENT:  Head: Normocephalic and atraumatic.  Right Ear: External ear normal.  Left Ear: External ear normal.  Nose: Nose normal.  Mouth/Throat: Oropharynx is clear and moist. No oropharyngeal exudate.  Tongue dry.   Eyes:  Conjunctivae and EOM are normal. Pupils are equal, round, and reactive to light.  Neck: Normal range of motion. Neck supple. No tracheal deviation present.  Cardiovascular: Normal rate, regular rhythm and normal heart sounds.  Exam reveals no gallop and no friction rub.   No murmur heard. Pulmonary/Chest: Effort normal and breath sounds normal. No respiratory distress. She has no wheezes. She has no rales.  Abdominal: Soft. Bowel sounds are normal. She exhibits no distension. There is tenderness. There is no rebound and no guarding.  Tenderness over LUQ, RUQ and right mid abdomen. Lower abdomen and suprapubic abdomen is non-tender.   Musculoskeletal: Normal range of motion. She exhibits tenderness.  Lower lumbar tenderness.   Neurological: She is alert and oriented to person, place, and time.  Skin: Skin is warm and dry.  Psychiatric: She has a normal mood and affect. Her behavior is normal.    ED Course  Procedures (including critical care time)   Medications  0.9 %  sodium chloride infusion (not administered)    Followed by  0.9 %  sodium chloride infusion (not administered)  ondansetron (ZOFRAN) injection 4 mg (not administered)  fentaNYL (SUBLIMAZE) injection 50 mcg (not administered)     DIAGNOSTIC STUDIES: Oxygen Saturation is 99% on room air, normal by my interpretation.    COORDINATION OF CARE:  10:08-Discussed planned course of treatment with the patient including IV fluids, nausea and pain medication, CT of abdomen, blood work and UA, who is agreeable at this time.   12:38 Dr Gery Pray, Radiologist called CT results  Discussed her CT results. Pt wants to stay here, wants her doctors to be at this hospital and not in [place]. Does not need any more pain medication at this time.  States she had a PAP smear done last year. States she has had a bloated feeling and her abdomen has been getting bigger.   13:37 Dr Kerry Hough, admit to med-surg, team 2  Results for orders placed  during the hospital encounter of 05/14/12  CBC WITH DIFFERENTIAL      Result Value Range   WBC 9.3  4.0 - 10.5 K/uL   RBC 5.09  3.87 - 5.11 MIL/uL   Hemoglobin 13.5  12.0 - 15.0 g/dL   HCT 08.6  57.8 - 46.9 %   MCV 82.9  78.0 - 100.0 fL   MCH 26.5  26.0 - 34.0 pg   MCHC 32.0  30.0 - 36.0 g/dL  RDW 13.8  11.5 - 15.5 %   Platelets 490 (*) 150 - 400 K/uL   Neutrophils Relative 67  43 - 77 %   Neutro Abs 6.2  1.7 - 7.7 K/uL   Lymphocytes Relative 24  12 - 46 %   Lymphs Abs 2.2  0.7 - 4.0 K/uL   Monocytes Relative 7  3 - 12 %   Monocytes Absolute 0.7  0.1 - 1.0 K/uL   Eosinophils Relative 2  0 - 5 %   Eosinophils Absolute 0.1  0.0 - 0.7 K/uL   Basophils Relative 0  0 - 1 %   Basophils Absolute 0.0  0.0 - 0.1 K/uL  COMPREHENSIVE METABOLIC PANEL      Result Value Range   Sodium 137  135 - 145 mEq/L   Potassium 3.4 (*) 3.5 - 5.1 mEq/L   Chloride 99  96 - 112 mEq/L   CO2 28  19 - 32 mEq/L   Glucose, Bld 111 (*) 70 - 99 mg/dL   BUN 11  6 - 23 mg/dL   Creatinine, Ser 8.65  0.50 - 1.10 mg/dL   Calcium 78.4  8.4 - 69.6 mg/dL   Total Protein 7.8  6.0 - 8.3 g/dL   Albumin 3.3 (*) 3.5 - 5.2 g/dL   AST 15  0 - 37 U/L   ALT 16  0 - 35 U/L   Alkaline Phosphatase 96  39 - 117 U/L   Total Bilirubin 0.4  0.3 - 1.2 mg/dL   GFR calc non Af Amer 83 (*) >90 mL/min   GFR calc Af Amer >90  >90 mL/min  LIPASE, BLOOD      Result Value Range   Lipase 19  11 - 59 U/L  URINALYSIS, ROUTINE W REFLEX MICROSCOPIC      Result Value Range   Color, Urine YELLOW  YELLOW   APPearance CLEAR  CLEAR   Specific Gravity, Urine 1.010  1.005 - 1.030   pH 7.0  5.0 - 8.0   Glucose, UA NEGATIVE  NEGATIVE mg/dL   Hgb urine dipstick NEGATIVE  NEGATIVE   Bilirubin Urine NEGATIVE  NEGATIVE   Ketones, ur NEGATIVE  NEGATIVE mg/dL   Protein, ur NEGATIVE  NEGATIVE mg/dL   Urobilinogen, UA 0.2  0.0 - 1.0 mg/dL   Nitrite NEGATIVE  NEGATIVE   Leukocytes, UA NEGATIVE  NEGATIVE   Laboratory interpretation all normal  except mild hypokalemia   Ct Abdomen Pelvis W Contrast  05/14/2012  *RADIOLOGY REPORT*  Clinical Data: Lower abdomen pain, history right breast carcinoma many years ago  CT ABDOMEN AND PELVIS WITH CONTRAST  Technique:  Multidetector CT imaging of the abdomen and pelvis was performed following the standard protocol during bolus administration of intravenous contrast.  Contrast: 50mL OMNIPAQUE IOHEXOL 300 MG/ML  SOLN, OMNIPAQUE IOHEXOL 300 MG/ML  SOLN  Comparison: CT abdomen pelvis of 10/21/2006  Findings: There is left lower lobe opacity possibly due to atelectasis with small left pleural effusion present.  The liver enhances with no focal abnormality and no ductal dilatation is seen.  However, there is a moderate amount of ascites throughout the peritoneal cavity.  Also there are multiple soft tissue lesions noted throughout the peritoneal cavity consistent with peritoneal implants of tumor with omental caking present as well.  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  No calcified gallstones are seen.  The pancreas is normal in size and the pancreatic duct is not dilated.  The adrenal  glands and spleen are unremarkable.  The stomach is moderately distended with oral contrast.  The kidneys enhance with parapelvic cysts bilaterally.  No hydronephrosis or renal mass is seen. On delayed images the pelvocaliceal systems are unremarkable with some displacement by the parapelvic cyst, left larger than right.  The proximal ureters are normal in caliber.  The abdominal aorta is normal in caliber.  No adenopathy is seen.  Ascites is noted throughout the pelvis as well with multiple peritoneal implants of tumor.  There are prominent adnexal soft tissue masses bilaterally.  On the left the soft tissue mass in the left adnexa measures 7.0 x 4.0 cm with the right adnexal soft tissue mass measuring 3.8 x 2.4 cm.  These may represent drop metastases, versus  ovarian malignancy.   The urinary bladder is partially decompressed but no gross abnormality is seen.  Multiple rectosigmoid colonic diverticula present.  The uterus has been previously resected.  A sclerotic focus is noted in the posterior right sacrum is most likely benign in origin.  The lumbar vertebrae are in normal alignment.  IMPRESSION:  1.  Moderate amount of ascites throughout the peritoneal cavity with diffuse peritoneal implants of tumor. Omental caking is present as well. 2.  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  Multiple rectosigmoid colonic diverticula. 4.  Left lower lobe atelectasis and/or pneumonia with small left pleural effusion.   Original Report Authenticated By: Dwyane Dee, M.D.      1. Abdominal pain   2. Malignant ascites   3. Omental metastasis   4. Ovarian mass     Plan admission   Devoria Albe, MD, FACEP   MDM    I personally performed the services described in this documentation, which was scribed in my presence. The recorded information has been reviewed and considered.  Devoria Albe, MD, Armando Gang       Zaevion Parke Givens, MD 05/14/12 1352

## 2012-05-14 NOTE — H&P (Signed)
[facility] History and Physical  Rita PATERNOSTRO ION:[id number] DOB: [birth year] DOA: 05/14/2012  Referring physician:  PCP: Carlota Raspberry, MD  Specialists:   Chief Complaint:   HPI: Rita Humphrey is a very pleasant 72 y.o. female with past medical hx of breast cancer s/p bilateral mastectomy, chemotherapy 1993 and 2003, HTN, hyperlipidemia, asthma, thyroid disease who presents to [facility] ED with CC abdominal pain. Information obtained from patient. She states about 4 months ago she developed abdominal pain after moving furniture. She reports at that time it "felt like i pulled something". Pain located lower quadrants of abdomen, described as stinging and intermittent. She reports that this pain has persisted intermittently for 4 months. She reports trying to see PCP but saw NP who stated she did not have a UTI. Pt has appointment with PCP 05/26/12 for regular check up. Pt states pain is 10/10 at its worst. She states lying on her side worsens the pain. She has taken tylenol with only fair relief. She reports the pain worsened today to the point "i was doubled over" so she came to the ED. She denies vomiting but has intermittent nausea. Denies dysuria, hematuria melena. States she does suffer from occasional constipation and recently observed red blood on tissue after BM. Denies CP, palpitation,dizziness, HA. She has lost 10lbs in last 4 weeks unintentionally and believes she has less energy. Symptoms came on suddenly, have persisted and worsened. Work up in ED included CT abdomen that yielded Moderate amount of ascites throughout the peritoneal cavity with diffuse peritoneal implants of tumor. Omental caking is present as well.  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 Multiple rectosigmoid colonic diverticula. Left lower lobe atelectasis and/or pneumonia with small left pleural effusion. [facility] are asked to  admit.    Review of Systems: The patient denies anorexia, fever, vision loss, decreased hearing, hoarseness, chest pain, syncope, dyspnea on exertion, peripheral edema, balance deficits, hemoptysis, melena,  severe indigestion/heartburn, hematuria, incontinence, genital sores, muscle weakness, suspicious skin lesions, transient blindness, difficulty walking, depression,  enlarged lymph nodes, angioedema.    Past Medical History  Diagnosis Date  . Cancer     [place]rest  . Hypertension   . Hyperlipemia   . CHF (congestive heart failure)   . Thyroid disease   . Asthma   . Breast cancer     s/p bilateral mastectomy 2440,1027   Past Surgical History  Procedure Laterality Date  . Abdominal hysterectomy    . Mastectomy    . Vascular surgery    . Thyroid lobectomy     Social History:  reports that she has never smoked. She reports that she does not drink alcohol or use illicit drugs. Pt is retired Administrator, sports at [employer], lives alone and is independent with ADL's. Daughter lives next door.   Allergies  Allergen Reactions  . Penicillins Anaphylaxis and Nausea And Vomiting  . Morphine And Related Nausea And Vomiting and Rash    No family history on file. Mother deceased at 10 throat cancer. Father deceased at 53 MI. 4 sibling who collective medical hx positive for CAD/MI x2, HTN, stroke.  Prior to Admission medications   Medication Sig Start Date End Date Taking? Authorizing Provider  acetaminophen (TYLENOL) 500 MG tablet Take 1,000 mg by mouth every 6 (six) hours as needed for pain.   Yes Historical Provider, MD  amLODipine (NORVASC) 10 MG tablet Take 10 mg by mouth daily.   Yes Historical  Provider, MD  Cyanocobalamin (VITAMIN B 12 PO) Take 1 tablet by mouth daily.   Yes Historical Provider, MD  hydrochlorothiazide (HYDRODIURIL) 25 MG tablet Take 12.5 mg by mouth daily.   Yes Historical Provider, MD  Multiple Vitamins-Minerals (CENTRUM SILVER ADULT 50+ PO) Take 1 tablet by mouth daily.    Yes Historical Provider, MD  pravastatin (PRAVACHOL) 20 MG tablet Take 20 mg by mouth at bedtime.   Yes Historical Provider, MD   Physical Exam: Filed Vitals:   05/14/12 0941  BP: 138/84  Pulse: 105  Temp: 98 F (36.7 C)  TempSrc: Oral  Resp: 18  Height: 5\' 3"  (1.6 m)  Weight: 83.915 kg (185 lb)  SpO2: 99%     General:  Pleasant obese NAD  Eyes: PERRL EOMI no scleral icterus  ENT: ears clear, nose without drainage,mucus membranes mouth pink slightly dry  Neck: supple no JVD no lymphadenopathy full rom  Cardiovascular: RRR No MGR no LEE PPP  Respiratory: normal effort BSCAB no wheeze/rhonchi  Abdomen: obese, soft +BS mild tenderness diffusely to palpation no guarding, no rebounding.   Skin: warm/dry, no rash lesions  Musculoskeletal: MAE, no joint swelling/erythema,  no clubbing cyanosis  Psychiatric: somewhat teary, appropriate, cooperative, calm  Neurologic: cranial nerve II-XII intact. Speech clear facial symmetry  Labs on Admission:  Basic Metabolic Panel:  Recent Labs Lab 05/14/12 1022  NA 137  K 3.4*  CL 99  CO2 28  GLUCOSE 111*  BUN 11  CREATININE 0.75  CALCIUM 10.0   Liver Function Tests:  Recent Labs Lab 05/14/12 1022  AST 15  ALT 16  ALKPHOS 96  BILITOT 0.4  PROT 7.8  ALBUMIN 3.3*    Recent Labs Lab 05/14/12 1022  LIPASE 19   No results found for this basename: AMMONIA,  in the last 168 hours CBC:  Recent Labs Lab 05/14/12 1022  WBC 9.3  NEUTROABS 6.2  HGB 13.5  HCT 42.2  MCV 82.9  PLT 490*   Cardiac Enzymes: No results found for this basename: CKTOTAL, CKMB, CKMBINDEX, TROPONINI,  in the last 168 hours  BNP (last 3 results) No results found for this basename: PROBNP,  in the last 8760 hours CBG: No results found for this basename: GLUCAP,  in the last 168 hours  Radiological Exams on Admission: Ct Abdomen Pelvis W Contrast  05/14/2012  *RADIOLOGY REPORT*  Clinical Data: Lower abdomen pain, history right breast  carcinoma many years ago  CT ABDOMEN AND PELVIS WITH CONTRAST  Technique:  Multidetector CT imaging of the abdomen and pelvis was performed following the standard protocol during bolus administration of intravenous contrast.  Contrast: 50mL OMNIPAQUE IOHEXOL 300 MG/ML  SOLN, OMNIPAQUE IOHEXOL 300 MG/ML  SOLN  Comparison: CT abdomen pelvis of 10/21/2006  Findings: There is left lower lobe opacity possibly due to atelectasis with small left pleural effusion present.  The liver enhances with no focal abnormality and no ductal dilatation is seen.  However, there is a moderate amount of ascites throughout the peritoneal cavity.  Also there are multiple soft tissue lesions noted throughout the peritoneal cavity consistent with peritoneal implants of tumor with omental caking present as well.  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  No calcified gallstones are seen.  The pancreas is normal in size and the pancreatic duct is not dilated.  The adrenal glands and spleen are unremarkable.  The stomach is moderately distended with oral contrast.  The kidneys enhance with parapelvic cysts bilaterally.  No hydronephrosis or renal mass is seen. On delayed images the pelvocaliceal systems are unremarkable with some displacement by the parapelvic cyst, left larger than right.  The proximal ureters are normal in caliber.  The abdominal aorta is normal in caliber.  No adenopathy is seen.  Ascites is noted throughout the pelvis as well with multiple peritoneal implants of tumor.  There are prominent adnexal soft tissue masses bilaterally.  On the left the soft tissue mass in the left adnexa measures 7.0 x 4.0 cm with the right adnexal soft tissue mass measuring 3.8 x 2.4 cm.  These may represent drop metastases, versus  ovarian malignancy.  The urinary bladder is partially decompressed but no gross abnormality is seen.  Multiple rectosigmoid colonic diverticula present.  The uterus has  been previously resected.  A sclerotic focus is noted in the posterior right sacrum is most likely benign in origin.  The lumbar vertebrae are in normal alignment.  IMPRESSION:  1.  Moderate amount of ascites throughout the peritoneal cavity with diffuse peritoneal implants of tumor. Omental caking is present as well. 2.  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  Multiple rectosigmoid colonic diverticula. 4.  Left lower lobe atelectasis and/or pneumonia with small left pleural effusion.   Original Report Authenticated By: Dwyane Dee, M.D.     EKG: Independently reviewed.   Assessment/Plan Principal Problem:   Abdominal pain: likely related to adnexal mass and peritoneal ascites per CT in pt with hx breast cancer. Will admit to medsurg. Will provide pain medicine as needed. Will request onc and gyn consult. Pt oncologist in past at [facility]. Has not seen in more than 10 years. Active Problems:   Hypokalemia: mild. Likely related to decreased po intake. Will replete and recheck.     HTN (hypertension): controlled. Will continue home meds.     Hyperlipemia: will check lipid panel. Continue statin     Code Status: full code at this time Family Communication: daughter lisa at bedside Disposition Plan: home when ready   Time spent: 60 minutes  [facility]   If 7PM-7AM, please contact night-coverage [email or web address] Password [facility] 05/14/2012, 2:28 PM  Attending note:  Patient seen and examined. Agree with note as above.  Oncology and GYN have been consulted.  Will order ultrasound guided paracentesis to see if fluid can be drawn for cytology.  She is having back pain, so will order xray of lumbar spine.

## 2012-05-15 ENCOUNTER — Inpatient Hospital Stay (HOSPITAL_COMMUNITY): Payer: Medicare Other

## 2012-05-15 DIAGNOSIS — C786 Secondary malignant neoplasm of retroperitoneum and peritoneum: Secondary | ICD-10-CM

## 2012-05-15 DIAGNOSIS — G893 Neoplasm related pain (acute) (chronic): Secondary | ICD-10-CM

## 2012-05-15 DIAGNOSIS — D696 Thrombocytopenia, unspecified: Secondary | ICD-10-CM

## 2012-05-15 DIAGNOSIS — M542 Cervicalgia: Secondary | ICD-10-CM | POA: Diagnosis present

## 2012-05-15 DIAGNOSIS — M545 Low back pain: Secondary | ICD-10-CM | POA: Diagnosis present

## 2012-05-15 LAB — BASIC METABOLIC PANEL
BUN: 7 mg/dL (ref 6–23)
CO2: 27 mEq/L (ref 19–32)
Calcium: 9 mg/dL (ref 8.4–10.5)
Chloride: 104 mEq/L (ref 96–112)
Creatinine, Ser: 0.63 mg/dL (ref 0.50–1.10)
GFR calc Af Amer: >90 mL/min (ref >90)

## 2012-05-15 MED ORDER — POTASSIUM CHLORIDE CRYS ER 20 MEQ PO TBCR
40.0000 meq | EXTENDED_RELEASE_TABLET | ORAL | Status: DC
  Filled 2012-05-15: qty 2

## 2012-05-15 MED ORDER — TRAZODONE HCL 50 MG PO TABS
50.0000 mg | ORAL_TABLET | Freq: Every evening | ORAL | Status: DC | PRN
  Filled 2012-05-15: qty 1

## 2012-05-15 MED ORDER — POTASSIUM CHLORIDE CRYS ER 20 MEQ PO TBCR
40.0000 meq | EXTENDED_RELEASE_TABLET | ORAL | Status: AC
  Administered 2012-05-15 (×2): 40 meq via ORAL
  Filled 2012-05-15: qty 2

## 2012-05-15 NOTE — Consult Note (Signed)
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  She reports that her abdominal pain started 4 months ago after moving furniture.  She reports "it felt like I pulled something."  She describes the pain as localized in the lower abdomen, stinging in nature, and intermittent.  She was seen by the Nurse Practitioner at her PCP office for this discomfort and her urine was tested.  The patient reports tat she tested negative for a UTI and released from the clinic. She reports that the pain is a 10 out of 10 on a pain scale.   The history of the patient's breast cancer follows:  1. In 1993 the patient was diagnosed with right-sided breast cancer.  She was treated with mastectomy on right, chemotherapy including the "red devil"- Doxorubicin, and no radiation.  This was treated in [place].  She reports that she did not require an anti-endocrine medication following the chemotherapy.  She also reports that she had limited follow-up following her treatment.  She reports 6-12 months worth of follow-up. 2. In 2003, she has a left breast cancer that was early stage.  It was treated with a left mastectomy.  She did not receive chemotherapy or radiation therapy.  This was treated in [place], [place].  She denies being tested for genetic for BRCA1 or 2.    I personally  reviewed and went over radiographic studies with the patient.  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    The patient reports that she had a hysterectomy consisting of 1 oophorectomy and uterus.  One ovary was left behind for hormonal reasons.   US paracentesis was performed today and fluid was collected and sent for cytologic analysis.  Hopefully this will provide a diagnosis.  She reports that her abdominal pain is much improved on current pain regimen and she is comfortable.    She denies any nausea or vomiting.  She does admit to a 10 lb weight loss x 4 months.  She admits to increasing abdominal size and tightness.    She otherwise denies headaches, dizziness, double vision, fevers, chills, night sweats, nausea, vomiting, diarrhea, constipation, chest pain, heart palpations, SOB, dyspnea, urinary pain/burning/frequency, hematuria   PAST MEDICAL HISTORY:   Past Medical History  Diagnosis Date  . Hypertension   . Hyperlipemia   . CHF (congestive heart failure)   . Thyroid disease   . Asthma   . Cancer     [place]  . Breast cancer     s/p bilateral mastectomy [id number]    ALLERGIES: Allergies  Allergen Reactions  . Penicillins Anaphylaxis and Nausea And Vomiting  . Morphine And Related Nausea And Vomiting and Rash      MEDICATIONS: I have reviewed the patient's current medications.     PAST SURGICAL  HISTORY Past Surgical History  Procedure Laterality Date  . Abdominal hysterectomy    . Mastectomy    . Vascular surgery    . Thyroid lobectomy      FAMILY HISTORY: Grandparents deceased before the was born and she does not know how they passed.  Father deceased at age 31 from MI Mother deceased at age 68 from throat cancer Brother deceased at 27 heart disease Sister deceased at 31 from MI Brother deceased in 23's from Stroke Sister deceased at age 58 from gangrene following a D+C after a  miscarriage Brother deceased at age 51 from heart disease and DM Brother deceased age 19 from emphysema and CHF Sister deceased age 91 from infection and renal failure Brother deceased at age 22 from Heart disease, DM. No Aunts or uncles with malignancy.  SOCIAL HISTORY: Patient lives in [place], [place] and is originally from [place].  She completed 8th grade of school.  She worked at the [employer] and retired as a Administrator, sports.  She is widowed since 2002 when her husband passed from CHF.  She lives by herself at home.  She denies EtOH, tobacco, and illicit drug abuse.   PERFORMANCE STATUS: The patient's performance status is 1 - Symptomatic but completely ambulatory  PHYSICAL EXAM: Most Recent Vital Signs: Blood pressure 156/93, pulse 81, temperature 98 F (36.7 C), temperature source Oral, resp. rate 20, height 5\' 3"  (1.6 m), weight 185 lb (83.915 kg), SpO2 94.00%. General appearance: alert, cooperative, appears stated age and no distress Head: Normocephalic, without obvious abnormality, atraumatic Eyes: negative findings: lids and lashes normal and conjunctivae and sclerae normal Ears: difficulty hearing in left ear Neck: no adenopathy and supple, symmetrical, trachea midline Back: symmetric, no curvature. ROM normal. No CVA tenderness. Lungs: clear to auscultation bilaterally Heart: regular rate and rhythm, S1, S2 normal, no murmur, click, rub or gallop Abdomen: abnormal findings:  ascites, distended and mild tenderness in the lower abdomen, and abdominal thickness Extremities: edema 1+ nonpitting B/L LE and no redness or tenderness in the calves or thighs Skin: Skin color, texture, turgor normal. No rashes or lesions Lymph nodes: Cervical, supraclavicular, and axillary nodes normal. Neurologic: Grossly normal  LABORATORY DATA:  Results for orders placed during the hospital encounter of 05/14/12 (from the past 48 hour(s))  CBC WITH DIFFERENTIAL     Status: Abnormal   Collection  Time    05/14/12 10:22 AM      Result Value Range   WBC 9.3  4.0 - 10.5 K/uL   RBC 5.09  3.87 - 5.11 MIL/uL   Hemoglobin 13.5  12.0 - 15.0 g/dL   HCT 16.1  09.6 - 04.5 %   MCV 82.9  78.0 - 100.0 fL   MCH 26.5  26.0 - 34.0 pg   MCHC 32.0  30.0 - 36.0 g/dL   RDW 40.9  81.1 - 91.4 %   Platelets 490 (*) 150 - 400 K/uL   Neutrophils Relative 67  43 - 77 %   Neutro Abs 6.2  1.7 - 7.7 K/uL   Lymphocytes Relative 24  12 - 46 %   Lymphs Abs 2.2  0.7 - 4.0 K/uL   Monocytes Relative 7  3 - 12 %   Monocytes Absolute 0.7  0.1 - 1.0 K/uL   Eosinophils Relative 2  0 - 5 %   Eosinophils Absolute 0.1  0.0 - 0.7 K/uL   Basophils Relative 0  0 - 1 %   Basophils Absolute 0.0  0.0 -  0.1 K/uL  COMPREHENSIVE METABOLIC PANEL     Status: Abnormal   Collection Time    05/14/12 10:22 AM      Result Value Range   Sodium 137  135 - 145 mEq/L   Potassium 3.4 (*) 3.5 - 5.1 mEq/L   Chloride 99  96 - 112 mEq/L   CO2 28  19 - 32 mEq/L   Glucose, Bld 111 (*) 70 - 99 mg/dL   BUN 11  6 - 23 mg/dL   Creatinine, Ser 1.61  0.50 - 1.10 mg/dL   Calcium 09.6  8.4 - 04.5 mg/dL   Total Protein 7.8  6.0 - 8.3 g/dL   Albumin 3.3 (*) 3.5 - 5.2 g/dL   AST 15  0 - 37 U/L   ALT 16  0 - 35 U/L   Alkaline Phosphatase 96  39 - 117 U/L   Total Bilirubin 0.4  0.3 - 1.2 mg/dL   GFR calc non Af Amer 83 (*) >90 mL/min   GFR calc Af Amer >90  >90 mL/min   Comment:            The eGFR has been calculated     using the CKD EPI equation.     This calculation has not been     validated in all clinical     situations.     eGFR's persistently     <90 mL/min signify     possible Chronic Kidney Disease.  LIPASE, BLOOD     Status: None   Collection Time    05/14/12 10:22 AM      Result Value Range   Lipase 19  11 - 59 U/L  URINALYSIS, ROUTINE W REFLEX MICROSCOPIC     Status: None   Collection Time    05/14/12 10:56 AM      Result Value Range   Color, Urine YELLOW  YELLOW   APPearance CLEAR  CLEAR   Specific Gravity, Urine  1.010  1.005 - 1.030   pH 7.0  5.0 - 8.0   Glucose, UA NEGATIVE  NEGATIVE mg/dL   Hgb urine dipstick NEGATIVE  NEGATIVE   Bilirubin Urine NEGATIVE  NEGATIVE   Ketones, ur NEGATIVE  NEGATIVE mg/dL   Protein, ur NEGATIVE  NEGATIVE mg/dL   Urobilinogen, UA 0.2  0.0 - 1.0 mg/dL   Nitrite NEGATIVE  NEGATIVE   Leukocytes, UA NEGATIVE  NEGATIVE   Comment: MICROSCOPIC NOT DONE ON URINES WITH NEGATIVE PROTEIN, BLOOD, LEUKOCYTES, NITRITE, OR GLUCOSE <1000 mg/dL.  BASIC METABOLIC PANEL     Status: Abnormal   Collection Time    05/15/12  5:45 AM      Result Value Range   Sodium 141  135 - 145 mEq/L   Potassium 3.3 (*) 3.5 - 5.1 mEq/L   Chloride 104  96 - 112 mEq/L   CO2 27  19 - 32 mEq/L   Glucose, Bld 99  70 - 99 mg/dL   BUN 7  6 - 23 mg/dL   Creatinine, Ser 4.09  0.50 - 1.10 mg/dL   Calcium 9.0  8.4 - 81.1 mg/dL   GFR calc non Af Amer 87 (*) >90 mL/min   GFR calc Af Amer >90  >90 mL/min   Comment:            The eGFR has been calculated     using the CKD EPI equation.     This calculation has not been     validated in all clinical  situations.     eGFR's persistently     <90 mL/min signify     possible Chronic Kidney Disease.  MAGNESIUM     Status: None   Collection Time    05/15/12  5:45 AM      Result Value Range   Magnesium 1.9  1.5 - 2.5 mg/dL      RADIOGRAPHY: Dg Cervical Spine 2 Or 3 Views  05/15/2012  *RADIOLOGY REPORT*  Clinical Data: Right-sided neck pain.  No known injury.  CERVICAL SPINE - 2-3 VIEW  Comparison: None  Findings: Normal alignment.  Prevertebral soft tissues are normal. Disc spaces are maintained.  No fracture.  IMPRESSION: No acute or significant bony abnormality.   Original Report Authenticated By: Charlett Nose, M.D.    Dg Lumbar Spine 2-3 Views  05/15/2012  *RADIOLOGY REPORT*  Clinical Data: Low back pain 3 years, worse last night, history of bilateral breast cancer  LUMBAR SPINE - 2-3 VIEW  Comparison: None Correlation:  CT abdomen pelvis 05/14/2012   Findings: Retained contrast within colon from recent CT. This partially obscures the lumbar spine on the AP view. On lateral view, vertebral body heights appear maintained without fracture or subluxation. No gross evidence of bone destruction or spondylolysis. SI joints symmetric.  IMPRESSION: Osseous demineralization. No definite acute bony findings.   Original Report Authenticated By: Ulyses Southward, M.D.    Ct Abdomen Pelvis W Contrast  05/14/2012  *RADIOLOGY REPORT*  Clinical Data: Lower abdomen pain, history right breast carcinoma many years ago  CT ABDOMEN AND PELVIS WITH CONTRAST  Technique:  Multidetector CT imaging of the abdomen and pelvis was performed following the standard protocol during bolus administration of intravenous contrast.  Contrast: 50mL OMNIPAQUE IOHEXOL 300 MG/ML  SOLN, OMNIPAQUE IOHEXOL 300 MG/ML  SOLN  Comparison: CT abdomen pelvis of 10/21/2006  Findings: There is left lower lobe opacity possibly due to atelectasis with small left pleural effusion present.  The liver enhances with no focal abnormality and no ductal dilatation is seen.  However, there is a moderate amount of ascites throughout the peritoneal cavity.  Also there are multiple soft tissue lesions noted throughout the peritoneal cavity consistent with peritoneal implants of tumor with omental caking present as well.  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  No calcified gallstones are seen.  The pancreas is normal in size and the pancreatic duct is not dilated.  The adrenal glands and spleen are unremarkable.  The stomach is moderately distended with oral contrast.  The kidneys enhance with parapelvic cysts bilaterally.  No hydronephrosis or renal mass is seen. On delayed images the pelvocaliceal systems are unremarkable with some displacement by the parapelvic cyst, left larger than right.  The proximal ureters are normal in caliber.  The abdominal aorta is normal in caliber.   No adenopathy is seen.  Ascites is noted throughout the pelvis as well with multiple peritoneal implants of tumor.  There are prominent adnexal soft tissue masses bilaterally.  On the left the soft tissue mass in the left adnexa measures 7.0 x 4.0 cm with the right adnexal soft tissue mass measuring 3.8 x 2.4 cm.  These may represent drop metastases, versus  ovarian malignancy.  The urinary bladder is partially decompressed but no gross abnormality is seen.  Multiple rectosigmoid colonic diverticula present.  The uterus has been previously resected.  A sclerotic focus is noted in the posterior right sacrum is most likely benign in origin.  The lumbar vertebrae are  in normal alignment.  IMPRESSION:  1.  Moderate amount of ascites throughout the peritoneal cavity with diffuse peritoneal implants of tumor. Omental caking is present as well. 2.  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  Multiple rectosigmoid colonic diverticula. 4.  Left lower lobe atelectasis and/or pneumonia with small left pleural effusion.   Original Report Authenticated By: Dwyane Dee, M.D.    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  In need of fluid for cytology.  PARACENTESIS WITH ULTRASOUND GUIDANCE  Comparison:  No priors.  Procedure: Prior to beginning the procedure, the procedure was discussed with the patient and the risks, benefits and alternatives were addressed.  The patient voiced understanding of the discussion, and gave written and verbal consent to proceed.  A time- out procedure was then performed.  Ultrasound was utilized to identify a suitable entry site overlying the largest pocket of ascites in the left lower quadrant of the abdomen.  A small mark was placed on the patient's skin.  The patient was prepped and draped in the usual sterile fashion.   A small amount of 1% lidocaine was  infiltrated to provide local anesthesia.  Subsequently, a small incision was made in the patient's skin, and using direct ultrasound guidance a 7 [place] Yueh centesis needle was introduced into the peritoneal space. The needle was withdrawn leaving the centesis catheter in position. Three 60 ml syringes were filled with a straw colored ascites.  The centesis catheter was then withdrawn from the patient and a small bandage was placed on the patient's skin.  All fluid samples were sent to the laboratory for testing analysis per orders of the primary medical team.  IMPRESSION: 1.  Successful ultrasound-guided paracentesis without complications, as above.   Original Report Authenticated By: Trudie Reed, M.D.        PATHOLOGY:  Pending from Paracentesis   ASSESSMENT:  1. 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 Will await cytology results from US Paracentesis on 3/11 2. Will continue to follow as an inpatient 3. Continue pain management   All questions were answered. The patient knows to call the clinic with any problems, questions or concerns. We can certainly see the patient much sooner if necessary.  The patient and plan discussed with [name], MD and he is in agreement with the aforementioned.  KEFALAS,THOMAS

## 2012-05-15 NOTE — Clinical Social Work Psychosocial (Signed)
Clinical Social Work Department BRIEF PSYCHOSOCIAL ASSESSMENT 05/15/2012  Patient:  Rita Humphrey, Rita Humphrey     Account Number:  [id number]     Admit date:  05/14/2012  Clinical Social Worker:  Nancie Neas  Date/Time:  05/15/2012 12:30 PM  Referred by:  Physician  Date Referred:  05/15/2012 Referred for  Advanced Directives   Other Referral:   Interview type:  Patient Other interview type:   and daughter- Rita Humphrey    PSYCHOSOCIAL DATA Living Status:  ALONE Admitted from facility:   Level of care:   Primary support name:  Rita Humphrey Primary support relationship to patient:  CHILD, ADULT Degree of support available:   supportive    CURRENT CONCERNS Current Concerns  None Noted   Other Concerns:    SOCIAL WORK ASSESSMENT / PLAN CSW met with pt at bedside following referral from MD for advance directives. Pt alert and oriented. She lives alone and has good family support, particularly from her daughter Rita Humphrey who lives next door. Pt is completely independent and enjoys doing lawn care. She reports she has had cancer and has been told she has a pelvic mass and is awaiting further testing. CSW provided support. Pt requesting to complete advance directives. CSW explained living will and HCPOA and pt completed both. She was given original and a copy and a copy was placed on chart.   Assessment/plan status:  No Further Intervention Required Other assessment/ plan:   Information/referral to community resources:   Possible Chaplain?    PATIENT'S/FAMILY'S RESPONSE TO PLAN OF CARE: Pt is waiting on results from consults today. She is concerned about these right now and may be open to speaking to chaplain after. CSW signing off as no further needs reported.        Derenda Fennel, [place] [phone]

## 2012-05-15 NOTE — Progress Notes (Signed)
INITIAL NUTRITION ASSESSMENT  DOCUMENTATION CODES Per approved criteria  -Obesity Class II   INTERVENTION: RD will continue to follow for nutrition care needs  NUTRITION DIAGNOSIS: None at this time  Goal: Pt to meet >/= 90% of their estimated nutrition needs  Monitor:  Meals: percentage intake , I/O's, labs and wt trends  Reason for Assessment: Malnutrition Screen  72 y.o. female  Admitting Dx: Abdominal pain  ASSESSMENT: Pt reports recent decreased po intake related to abdominal pain. Prior to that her po intake was good. She has experienced 20# wt loss 10% since May 2013 which is not clinically significant. Ascites most likely masking additional wt decrease. Radiology notes today 180 ml abdominal fluid removed for lab evaluation. Will follow for wt changes and po intake. Pt has hx of edema bilateral lower extremities which may also skew actual wt changes.  Pt does not meet criteria for malnutrition at this time.   Height: Ht Readings from Last 1 Encounters:  05/14/12 5\' 3"  (1.6 m)    Weight: Wt Readings from Last 1 Encounters:  05/14/12 185 lb (83.915 kg)    Ideal Body Weight: 115# (52.2kg)  % Ideal Body Weight: 161%  Wt Readings from Last 10 Encounters:  05/14/12 185 lb (83.915 kg)  07/10/11 205 lb (92.987 kg)    Usual Body Weight: 205#  % Usual Body Weight: 90%  BMI:  Body mass index is 32.78 kg/(m^2).Obesity Class II  Estimated Nutritional Needs: Kcal: 1300-1560 Protein: 78-94 gr Fluid: > 2000 ml/day  Skin: no issues noted  Diet Order: General  EDUCATION NEEDS: -No education needs identified at this time   Intake/Output Summary (Last 24 hours) at 05/15/12 1118 Last data filed at 05/14/12 1800  Gross per 24 hour  Intake    240 ml  Output      0 ml  Net    240 ml    Last BM: PTA  Labs:   Recent Labs Lab 05/14/12 1022 05/15/12 0545  NA 137 141  K 3.4* 3.3*  CL 99 104  CO2 28 27  BUN 11 7  CREATININE 0.75 0.63  CALCIUM 10.0  9.0  MG  --  1.9  GLUCOSE 111* 99    CBG (last 3)  No results found for this basename: GLUCAP,  in the last 72 hours  Scheduled Meds: . amLODipine  10 mg Oral Daily  . enoxaparin (LOVENOX) injection  40 mg Subcutaneous Q24H  . potassium chloride  40 mEq Oral Q4H  . simvastatin  5 mg Oral q1800  . sodium chloride  3 mL Intravenous Q12H    Continuous Infusions: . sodium chloride 50 mL/hr at 05/14/12 2216    Past Medical History  Diagnosis Date  . Hypertension   . Hyperlipemia   . CHF (congestive heart failure)   . Thyroid disease   . Asthma   . Cancer     [place]  . Breast cancer     s/p bilateral mastectomy [id number]    Past Surgical History  Procedure Laterality Date  . Abdominal hysterectomy    . Mastectomy    . Vascular surgery    . Thyroid lobectomy      Royann Shivers MS,RD,LDN,CSG Office: #[phone] Pager: [phone]

## 2012-05-15 NOTE — Progress Notes (Signed)
Lidocaine 1%          3mL injected                        abdominal fluid removed for lab evaluation

## 2012-05-15 NOTE — Progress Notes (Signed)
[facility] PROGRESS NOTE  Rita Humphrey RUE:[id number] DOB: 1941/01/10 DOA: 05/14/2012 PCP: Carlota Raspberry, MD  Assessment/Plan: Abdominal pain: likely related to adnexal mass and peritoneal ascites per CT in pt with hx breast cancer. Tylenol adequately managing pain. Await US guided paracentesis for cytology. Await onc and gyn consult.    Active Problems:  Low back pain. Pt reports relationship between low back pain and abdominal pain in terms of course. Lumbar xray yields no bony abnormalities.   Neck pain: reports developed 1 week ago. No numbness tingling of upper extremities. No weakness. Mildly tender. Will get cervical xray  Hypokalemia: mild. Likely related to decreased po intake and HCTZ. Continues to be at low end of range. Will hold HCTZ for now, replete and recheck. Will check mag level.    HTN (hypertension): controlled. Holding HCTZ for now due to #4. Continue norvasc   Hyperlipemia:  Continue statin     Code Status: full Family Communication: daughters at bedside Disposition Plan: home when ready.   Consultants:  OB-GYN  Oncology  Procedures:  05/15/12 Parcentesis  Antibiotics:  none  HPI/Subjective: Sitting on side of bed visiting with family  Objective: Filed Vitals:   05/14/12 1447 05/14/12 1643 05/14/12 2116 05/15/12 0513  BP: 136/83 147/84 137/73 104/64  Pulse: 98 112 95 81  Temp:  97.6 F (36.4 C) 98.3 F (36.8 C) 98 F (36.7 C)  TempSrc:  Oral    Resp: 20 20 20 20   Height:  5\' 3"  (1.6 m)    Weight:  83.915 kg (185 lb)    SpO2: 95% 92% 94% 94%    Intake/Output Summary (Last 24 hours) at 05/15/12 1009 Last data filed at 05/14/12 1800  Gross per 24 hour  Intake    240 ml  Output      0 ml  Net    240 ml   Filed Weights   05/14/12 0941 05/14/12 1643  Weight: 83.915 kg (185 lb) 83.915 kg (185 lb)    Exam:   General:  Alert obese NAD  Cardiovascular: RRR No MGR No LEE no clubbing/cyanosis  Respiratory: normal effort  BSCTAB . No wheeze, no rhonchi  Abdomen: obese +BS non-tender to palpation  Musculoskeletal: MAE. Neck with full ROM, mild tenderness to palpation. Joints without tenderness/erythema   Data Reviewed: Basic Metabolic Panel:  Recent Labs Lab 05/14/12 1022 05/15/12 0545  NA 137 141  K 3.4* 3.3*  CL 99 104  CO2 28 27  GLUCOSE 111* 99  BUN 11 7  CREATININE 0.75 0.63  CALCIUM 10.0 9.0   Liver Function Tests:  Recent Labs Lab 05/14/12 1022  AST 15  ALT 16  ALKPHOS 96  BILITOT 0.4  PROT 7.8  ALBUMIN 3.3*    Recent Labs Lab 05/14/12 1022  LIPASE 19   No results found for this basename: AMMONIA,  in the last 168 hours CBC:  Recent Labs Lab 05/14/12 1022  WBC 9.3  NEUTROABS 6.2  HGB 13.5  HCT 42.2  MCV 82.9  PLT 490*   Cardiac Enzymes: No results found for this basename: CKTOTAL, CKMB, CKMBINDEX, TROPONINI,  in the last 168 hours BNP (last 3 results) No results found for this basename: PROBNP,  in the last 8760 hours CBG: No results found for this basename: GLUCAP,  in the last 168 hours  No results found for this or any previous visit (from the past 240 hour(s)).   Studies: Dg Lumbar Spine 2-3 Views  05/15/2012  *RADIOLOGY REPORT*  Clinical  Data: Low back pain 3 years, worse last night, history of bilateral breast cancer  LUMBAR SPINE - 2-3 VIEW  Comparison: None Correlation:  CT abdomen pelvis 05/14/2012  Findings: Retained contrast within colon from recent CT. This partially obscures the lumbar spine on the AP view. On lateral view, vertebral body heights appear maintained without fracture or subluxation. No gross evidence of bone destruction or spondylolysis. SI joints symmetric.  IMPRESSION: Osseous demineralization. No definite acute bony findings.   Original Report Authenticated By: Ulyses Southward, M.D.    Ct Abdomen Pelvis W Contrast  05/14/2012  *RADIOLOGY REPORT*  Clinical Data: Lower abdomen pain, history right breast carcinoma many years ago  CT ABDOMEN  AND PELVIS WITH CONTRAST  Technique:  Multidetector CT imaging of the abdomen and pelvis was performed following the standard protocol during bolus administration of intravenous contrast.  Contrast: 50mL OMNIPAQUE IOHEXOL 300 MG/ML  SOLN, OMNIPAQUE IOHEXOL 300 MG/ML  SOLN  Comparison: CT abdomen pelvis of 10/21/2006  Findings: There is left lower lobe opacity possibly due to atelectasis with small left pleural effusion present.  The liver enhances with no focal abnormality and no ductal dilatation is seen.  However, there is a moderate amount of ascites throughout the peritoneal cavity.  Also there are multiple soft tissue lesions noted throughout the peritoneal cavity consistent with peritoneal implants of tumor with omental caking present as well.  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  No calcified gallstones are seen.  The pancreas is normal in size and the pancreatic duct is not dilated.  The adrenal glands and spleen are unremarkable.  The stomach is moderately distended with oral contrast.  The kidneys enhance with parapelvic cysts bilaterally.  No hydronephrosis or renal mass is seen. On delayed images the pelvocaliceal systems are unremarkable with some displacement by the parapelvic cyst, left larger than right.  The proximal ureters are normal in caliber.  The abdominal aorta is normal in caliber.  No adenopathy is seen.  Ascites is noted throughout the pelvis as well with multiple peritoneal implants of tumor.  There are prominent adnexal soft tissue masses bilaterally.  On the left the soft tissue mass in the left adnexa measures 7.0 x 4.0 cm with the right adnexal soft tissue mass measuring 3.8 x 2.4 cm.  These may represent drop metastases, versus  ovarian malignancy.  The urinary bladder is partially decompressed but no gross abnormality is seen.  Multiple rectosigmoid colonic diverticula present.  The uterus has been previously resected.  A  sclerotic focus is noted in the posterior right sacrum is most likely benign in origin.  The lumbar vertebrae are in normal alignment.  IMPRESSION:  1.  Moderate amount of ascites throughout the peritoneal cavity with diffuse peritoneal implants of tumor. Omental caking is present as well. 2.  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  Multiple rectosigmoid colonic diverticula. 4.  Left lower lobe atelectasis and/or pneumonia with small left pleural effusion.   Original Report Authenticated By: Dwyane Dee, M.D.     Scheduled Meds: . amLODipine  10 mg Oral Daily  . enoxaparin (LOVENOX) injection  40 mg Subcutaneous Q24H  . hydrochlorothiazide  12.5 mg Oral Daily  . potassium chloride  40 mEq Oral Q4H  . simvastatin  5 mg Oral q1800  . sodium chloride  3 mL Intravenous Q12H   Continuous Infusions: . sodium chloride 50 mL/hr at 05/14/12 2216  Principal Problem:   Abdominal pain Active Problems:   Hypokalemia   HTN (hypertension)   Hyperlipemia    Time spent: 30 minutes    [facility] M  [facility]  If 7PM-7AM, please contact night-coverage at [email or web address], password [facility] 05/15/2012, 10:09 AM  LOS: 1 day   Attending note:  Patient seen and examined.  Agree with note as above.  Cytology from paracentesis is pending.  Await input from oncology and GYN consults.

## 2012-05-16 DIAGNOSIS — R188 Other ascites: Secondary | ICD-10-CM | POA: Diagnosis present

## 2012-05-16 DIAGNOSIS — N9489 Other specified conditions associated with female genital organs and menstrual cycle: Secondary | ICD-10-CM

## 2012-05-16 DIAGNOSIS — K5900 Constipation, unspecified: Secondary | ICD-10-CM | POA: Diagnosis present

## 2012-05-16 HISTORY — DX: Other specified conditions associated with female genital organs and menstrual cycle: N94.89

## 2012-05-16 LAB — GLUCOSE, CAPILLARY: Glucose-Capillary: 98 mg/dL (ref 70–99)

## 2012-05-16 LAB — BASIC METABOLIC PANEL
BUN: 9 mg/dL (ref 6–23)
CO2: 25 mEq/L (ref 19–32)
Chloride: 108 mEq/L (ref 96–112)
Creatinine, Ser: 0.59 mg/dL (ref 0.50–1.10)

## 2012-05-16 MED ORDER — SENNOSIDES-DOCUSATE SODIUM 8.6-50 MG PO TABS
1.0000 | ORAL_TABLET | Freq: Two times a day (BID) | ORAL | Status: DC
  Administered 2012-05-16 (×2): 1 via ORAL
  Filled 2012-05-16 (×2): qty 1

## 2012-05-16 MED ORDER — MAGNESIUM HYDROXIDE 400 MG/5ML PO SUSP: 30.0000 mL | Freq: Every day | ORAL | Status: DC | PRN

## 2012-05-16 NOTE — Care Management Note (Signed)
    Page 1 of 1   05/17/2012     12:24:24 PM   CARE MANAGEMENT NOTE 05/17/2012  Patient:  Rita Humphrey, Rita Humphrey   Account Number:  [id number]  Date Initiated:  05/16/2012  Documentation initiated by:  Rosemary Holms  Subjective/Objective Assessment:   Pt admitted with abdominal/back pain. Spoke to pt and her daughters were at the bedside. Plans to return home. No HH needs anticipated or identified.     Action/Plan:   Anticipated DC Date:  05/17/2012   Anticipated DC Plan:  HOME/SELF CARE      DC Planning Services  CM consult      Choice offered to / List presented to:             Status of service:  Completed, signed off Medicare Important Message given?  YES (If response is "NO", the following Medicare IM given date fields will be blank) Date Medicare IM given:  05/17/2012 Date Additional Medicare IM given:    Discharge Disposition:  HOME/SELF CARE  Per UR Regulation:    If discussed at Long Length of Stay Meetings, dates discussed:    Comments:  05/16/12 Rosemary Holms RN BSN CM

## 2012-05-16 NOTE — Progress Notes (Signed)
[facility] PROGRESS NOTE  CHARDONAY SCRITCHFIELD WUJ:[id number] DOB: [birth year] DOA: 05/14/2012 PCP: Carlota Raspberry, MD  Assessment/Plan: Abdominal pain: likely related to adnexal mass and peritoneal ascites per CT in pt with hx breast cancer. Tylenol continues to adequately manage pain. Await cytology from  US guided paracentesis on 05/15/12. Appreciate oncology assistance. Also spoke with Dr. Emelda Fear from Gyn who indicated he has spoken to oncology regarding patient and oncology will contact him and/or coordinate referrals. Confirmed with Tom PA for oncology.   Active Problems:  Adnexal mass: per CT. Oncology coordinating care with GYN.   Peritoneal ascites: s/p paracentesis. removed 05/15/12. Await cytology. See #1. Appreciate oncology assistance.   Low back pain. Pt reports relationship between low back pain and abdominal pain in terms of course. Lumbar xray yields no bony abnormalities. Improved somewhat. Continue with tylenol  Neck pain: reports developed 1 week ago. No numbness tingling of upper extremities. No weakness. Mildly tender. Cervical xray neg for significant bony abnormality.   Hypokalemia: mild. Likely related to decreased po intake and HCTZ. Resolved this am. Will continue to hold HCTZ for now. Monitor BP. Mag level 1.9. Bmet in am.   HTN (hypertension): controlled. Holding HCTZ for now due to #4. Continue norvasc   Constipation: pt reports no BM 3 days. States chronically constipated. Will provide stool softener and MOM prn.   Hyperlipemia: Continue statin    Code Status: full Family Communication: daughter at bedside Disposition Plan: home when ready    Consultants:  Oncology  Procedures:  Paracentesis 05/15/12>>>  Antibiotics:  none  HPI/Subjective: Lying in bed visiting with family. Reports slight worsening of pain and wonders if related to constipation.    Objective: Filed Vitals:   05/15/12 0513 05/15/12 1030 05/15/12 2210 05/16/12 0526  BP:  104/64 156/93 130/65 123/70  Pulse: 81  94 87  Temp: 98 F (36.7 C)  98 F (36.7 C) 98 F (36.7 C)  TempSrc:   Oral   Resp: 20  20 18   Height:      Weight:      SpO2: 94%  95% 97%    Intake/Output Summary (Last 24 hours) at 05/16/12 1315 Last data filed at 05/16/12 0900  Gross per 24 hour  Intake    480 ml  Output      0 ml  Net    480 ml   Filed Weights   05/14/12 0941 05/14/12 1643  Weight: 83.915 kg (185 lb) 83.915 kg (185 lb)    Exam:   General:  Obese 72 year old Caucasian woman laying in bed, in no acute distress.   Cardiovascular: S1, S2, with a 1-2/6 systolic murmur.  Respiratory: normal effort CTA bilaterally. No rhonchi, no wheeze  Abdomen: obese, positive bowel sounds, soft, mild tenderness to palpation in lower quadrants.   Musculoskeletal: moves all extremities, no joint swelling/erythema   Data Reviewed: Basic Metabolic Panel:  Recent Labs Lab 05/14/12 1022 05/15/12 0545 05/16/12 0554  NA 137 141 140  K 3.4* 3.3* 3.7  CL 99 104 108  CO2 28 27 25   GLUCOSE 111* 99 99  BUN 11 7 9   CREATININE 0.75 0.63 0.59  CALCIUM 10.0 9.0 8.8  MG  --  1.9  --    Liver Function Tests:  Recent Labs Lab 05/14/12 1022  AST 15  ALT 16  ALKPHOS 96  BILITOT 0.4  PROT 7.8  ALBUMIN 3.3*    Recent Labs Lab 05/14/12 1022  LIPASE 19   No results found for  this basename: AMMONIA,  in the last 168 hours CBC:  Recent Labs Lab 05/14/12 1022  WBC 9.3  NEUTROABS 6.2  HGB 13.5  HCT 42.2  MCV 82.9  PLT 490*   Cardiac Enzymes: No results found for this basename: CKTOTAL, CKMB, CKMBINDEX, TROPONINI,  in the last 168 hours BNP (last 3 results) No results found for this basename: PROBNP,  in the last 8760 hours CBG:  Recent Labs Lab 05/16/12 0800 05/16/12 1149  GLUCAP 102* 98    No results found for this or any previous visit (from the past 240 hour(s)).   Studies: Dg Cervical Spine 2 Or 3 Views  05/15/2012  *RADIOLOGY REPORT*  Clinical  Data: Right-sided neck pain.  No known injury.  CERVICAL SPINE - 2-3 VIEW  Comparison: None  Findings: Normal alignment.  Prevertebral soft tissues are normal. Disc spaces are maintained.  No fracture.  IMPRESSION: No acute or significant bony abnormality.   Original Report Authenticated By: Charlett Nose, M.D.    Dg Lumbar Spine 2-3 Views  05/15/2012  *RADIOLOGY REPORT*  Clinical Data: Low back pain 3 years, worse last night, history of bilateral breast cancer  LUMBAR SPINE - 2-3 VIEW  Comparison: None Correlation:  CT abdomen pelvis 05/14/2012  Findings: Retained contrast within colon from recent CT. This partially obscures the lumbar spine on the AP view. On lateral view, vertebral body heights appear maintained without fracture or subluxation. No gross evidence of bone destruction or spondylolysis. SI joints symmetric.  IMPRESSION: Osseous demineralization. No definite acute bony findings.   Original Report Authenticated By: Ulyses Southward, M.D.    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  In need of fluid for cytology.  PARACENTESIS WITH ULTRASOUND GUIDANCE  Comparison:  No priors.  Procedure: Prior to beginning the procedure, the procedure was discussed with the patient and the risks, benefits and alternatives were addressed.  The patient voiced understanding of the discussion, and gave written and verbal consent to proceed.  A time- out procedure was then performed.  Ultrasound was utilized to identify a suitable entry site overlying the largest pocket of ascites in the left lower quadrant of the abdomen.  A small mark was placed on the patient's skin.  The patient was prepped and draped in the usual sterile fashion.   A small amount of 1% lidocaine was infiltrated to provide local anesthesia.  Subsequently, a small incision was made in the patient's skin, and using direct ultrasound guidance a 7 [place] Yueh centesis needle was  introduced into the peritoneal space. The needle was withdrawn leaving the centesis catheter in position. Three 60 ml syringes were filled with a straw colored ascites.  The centesis catheter was then withdrawn from the patient and a small bandage was placed on the patient's skin.  All fluid samples were sent to the laboratory for testing analysis per orders of the primary medical team.  IMPRESSION: 1.  Successful ultrasound-guided paracentesis without complications, as above.   Original Report Authenticated By: Trudie Reed, M.D.     Scheduled Meds: . amLODipine  10 mg Oral Daily  . enoxaparin (LOVENOX) injection  40 mg Subcutaneous Q24H  . senna-docusate  1 tablet Oral BID  . simvastatin  5 mg Oral q1800  . sodium chloride  3 mL Intravenous Q12H   Continuous Infusions: . sodium chloride 50 mL/hr at 05/16/12 1126    Principal Problem:   Abdominal pain Active Problems:   Hypokalemia  HTN (hypertension)   Hyperlipemia   Low back pain   Neck pain   Ascites   Adnexal mass    Time spent: 35 minutes    [facility] M  [facility]  If 7PM-7AM, please contact night-coverage at [email or web address], password [facility] 05/16/2012, 1:15 PM  LOS: 2 days      Attending: The patient was seen and examined. She was discussed with NP, Ms. Vedia Coffer. The above note has been annotated accordingly. Agree with findings and assessment. Will await the results of the pathology report. We'll follow oncology and GYN's recommendations. If the results are not available in the next 24-48 hours, we'll consider discharging the patient to home and inform her of the results following discharge. We will order an increase in activity and ambulation. Of note, the patient says that she is "tired of laying in bed". We'll saline lock her IV.      Elliot Cousin, M.D.

## 2012-05-16 NOTE — Progress Notes (Signed)
Subjective: Patient seen and examined by Dr. Mariel Sleet and myself.  HPI reviewed with patient  Chart is reviewed  CT scans reviewed with radiologist.  Patient reports that abdomen started hurting in Thanksgiving.  She was seen by her GI physician who performed a colonoscopy because her screening test was due.  It was negative.  I personally reviewed and went over radiographic studies with the patient and family.   Objective: Vital signs in last 24 hours: Temp:  [98 F (36.7 C)-98.5 F (36.9 C)] 98.5 F (36.9 C) (03/12 1438) Pulse Rate:  [87-100] 100 (03/12 1438) Resp:  [18-20] 20 (03/12 1438) BP: (123-136)/(65-78) 136/78 mmHg (03/12 1438) SpO2:  [93 %-97 %] 93 % (03/12 1438)  Intake/Output from previous day: 03/11 0800 - 03/12 0759 In: 1083.3 [P.O.:240; I.V.:843.3] Out: -  Intake/Output this shift: Total I/O In: 480 [P.O.:480] Out: -   GI: abnormal findings:  distended, sense of fluid, thickened Neck: Trachea midline, no lymphadenopathy  Lab Results:   Recent Labs  05/14/12 1022  WBC 9.3  HGB 13.5  HCT 42.2  PLT 490*   BMET  Recent Labs  05/15/12 0545 05/16/12 0554  NA 141 140  K 3.3* 3.7  CL 104 108  CO2 27 25  GLUCOSE 99 99  BUN 7 9  CREATININE 0.63 0.59  CALCIUM 9.0 8.8    Studies/Results: Dg Cervical Spine 2 Or 3 Views  05/15/2012  *RADIOLOGY REPORT*  Clinical Data: Right-sided neck pain.  No known injury.  CERVICAL SPINE - 2-3 VIEW  Comparison: None  Findings: Normal alignment.  Prevertebral soft tissues are normal. Disc spaces are maintained.  No fracture.  IMPRESSION: No acute or significant bony abnormality.   Original Report Authenticated By: Charlett Nose, M.D.    Dg Lumbar Spine 2-3 Views  05/15/2012  *RADIOLOGY REPORT*  Clinical Data: Low back pain 3 years, worse last night, history of bilateral breast cancer  LUMBAR SPINE - 2-3 VIEW  Comparison: None Correlation:  CT abdomen pelvis 05/14/2012  Findings: Retained contrast within colon from  recent CT. This partially obscures the lumbar spine on the AP view. On lateral view, vertebral body heights appear maintained without fracture or subluxation. No gross evidence of bone destruction or spondylolysis. SI joints symmetric.  IMPRESSION: Osseous demineralization. No definite acute bony findings.   Original Report Authenticated By: Ulyses Southward, M.D.    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  In need of fluid for cytology.  PARACENTESIS WITH ULTRASOUND GUIDANCE  Comparison:  No priors.  Procedure: Prior to beginning the procedure, the procedure was discussed with the patient and the risks, benefits and alternatives were addressed.  The patient voiced understanding of the discussion, and gave written and verbal consent to proceed.  A time- out procedure was then performed.  Ultrasound was utilized to identify a suitable entry site overlying the largest pocket of ascites in the left lower quadrant of the abdomen.  A small mark was placed on the patient's skin.  The patient was prepped and draped in the usual sterile fashion.   A small amount of 1% lidocaine was infiltrated to provide local anesthesia.  Subsequently, a small incision was made in the patient's skin, and using direct ultrasound guidance a 7 [place] Yueh centesis needle was introduced into the peritoneal space. The needle was withdrawn leaving the centesis catheter in position. Three 60 ml syringes were filled with a straw colored ascites.  The centesis catheter was  then withdrawn from the patient and a small bandage was placed on the patient's skin.  All fluid samples were sent to the laboratory for testing analysis per orders of the primary medical team.  IMPRESSION: 1.  Successful ultrasound-guided paracentesis without complications, as above.   Original Report Authenticated By: Trudie Reed, M.D.     Medications: I have reviewed the patient's current  medications.  Assessment/Plan: 1. 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 Abdominal pain, well controlled with current pain regimen 3. Thrombocytosis, reactive  4. Hypokalemia, repleted  5. History of breast cancer in 1993 and 2003  6. HTN  7. Hyperlipidemia  8. Asthma  9. Thyroid disease  Patient and plan discussed with Dr. [name] and he is in agreement with the aforementioned.     LOS: 2 days    Rita Humphrey 05/16/2012

## 2012-05-16 NOTE — Progress Notes (Signed)
UR Chart Review Completed  

## 2012-05-17 ENCOUNTER — Encounter (HOSPITAL_COMMUNITY): Payer: Self-pay | Admitting: Internal Medicine

## 2012-05-17 DIAGNOSIS — Z853 Personal history of malignant neoplasm of breast: Secondary | ICD-10-CM

## 2012-05-17 DIAGNOSIS — C786 Secondary malignant neoplasm of retroperitoneum and peritoneum: Secondary | ICD-10-CM | POA: Diagnosis present

## 2012-05-17 DIAGNOSIS — C569 Malignant neoplasm of unspecified ovary: Secondary | ICD-10-CM | POA: Diagnosis present

## 2012-05-17 HISTORY — DX: Secondary malignant neoplasm of retroperitoneum and peritoneum: C78.6

## 2012-05-17 HISTORY — DX: Malignant neoplasm of unspecified ovary: C56.9

## 2012-05-17 LAB — BASIC METABOLIC PANEL
GFR calc Af Amer: >90 mL/min (ref >90)
GFR calc non Af Amer: >90 mL/min (ref >90)
Potassium: 3.9 mEq/L (ref 3.5–5.1)
Sodium: 139 mEq/L (ref 135–145)

## 2012-05-17 MED ORDER — ACETAMINOPHEN 500 MG PO TABS: 500.0000 mg | ORAL_TABLET | Freq: Four times a day (QID) | ORAL | Status: DC | PRN

## 2012-05-17 MED ORDER — HYDROCODONE-ACETAMINOPHEN 5-325 MG PO TABS: 1.0000 | ORAL_TABLET | Freq: Four times a day (QID) | ORAL | Status: DC | PRN

## 2012-05-17 MED ORDER — SENNOSIDES-DOCUSATE SODIUM 8.6-50 MG PO TABS: 1.0000 | ORAL_TABLET | Freq: Two times a day (BID) | ORAL | Status: DC

## 2012-05-17 MED ORDER — MAGNESIUM HYDROXIDE 400 MG/5ML PO SUSP: 30.0000 mL | Freq: Every day | ORAL | Status: DC | PRN

## 2012-05-17 NOTE — Progress Notes (Signed)
Subjective: Spoke with Dr. [name] (pathologist).  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  He is unsure of the primary at this time.  I informed him of her past history of breast cancer and he will perform some more immunohistochemistry testing.  Those results should be reported tomorrow AM.  With her cytology demonstrating adenocarcinoma, her clinical presentation, and CT images, we should presume that this is ovarian carcinoma versus primary peritoneal, keeping in mind the potential for breast cancer.   With this information, I have contacted [facility] in [place].  I presented the case to Telford Nab.  She has made the patient an appointment to be seen by Dr. [name] on 3/20 at 9 AM with an 8:30 AM arrival.  I printed this appointment schedule, placed one copy in the chart, gave one copy to the patient, and 2 extra for family members.   The patient wishes to go home today.  She will require some outpatient pain medication.  I will order a CA 125 today.   Objective: Vital signs in last 24 hours: Temp:  [98.1 F (36.7 C)-98.8 F (37.1 C)] 98.8 F (37.1 C) (03/13 0500) Pulse Rate:  [90-100] 96 (03/13 0500) Resp:  [16-20] 16 (03/13 0500) BP: (127-143)/(78-82) 143/82 mmHg (03/13 0500) SpO2:  [93 %-97 %] 97 % (03/13 0500)  Intake/Output from previous day: 03/12 0800 - 03/13 0759 In: 720 [P.O.:720] Out: -  Intake/Output this shift: Total I/O In: 240 [P.O.:240] Out: 1 [Stool:1]  General appearance: alert, cooperative, no distress and in bed with step daughter at the bed side GI: abnormal findings:  distended, sense of ascites, thickened  Lab Results:  No results found for this basename: WBC, HGB, HCT, PLT,  in the last 72 hours BMET  Recent Labs  05/16/12 0554 05/17/12 0528  NA 140 139  K 3.7 3.9  CL 108 106  CO2 25 24  GLUCOSE 99 104*  BUN 9 10  CREATININE 0.59 0.54  CALCIUM 8.8 8.8    Studies/Results: US  Paracentesis  05/15/2012  *RADIOLOGY REPORT*  Clinical Data: Probable malignant ascites from peritoneal metastases.  In need of fluid for cytology.  PARACENTESIS WITH ULTRASOUND GUIDANCE  Comparison:  No priors.  Procedure: Prior to beginning the procedure, the procedure was discussed with the patient and the risks, benefits and alternatives were addressed.  The patient voiced understanding of the discussion, and gave written and verbal consent to proceed.  A time- out procedure was then performed.  Ultrasound was utilized to identify a suitable entry site overlying the largest pocket of ascites in the left lower quadrant of the abdomen.  A small mark was placed on the patient's skin.  The patient was prepped and draped in the usual sterile fashion.   A small amount of 1% lidocaine was infiltrated to provide local anesthesia.  Subsequently, a small incision was made in the patient's skin, and using direct ultrasound guidance a 7 [place] Yueh centesis needle was introduced into the peritoneal space. The needle was withdrawn leaving the centesis catheter in position. Three 60 ml syringes were filled with a straw colored ascites.  The centesis catheter was then withdrawn from the patient and a small bandage was placed on the patient's skin.  All fluid samples were sent to the laboratory for testing analysis per orders of the primary medical team.  IMPRESSION: 1.  Successful ultrasound-guided paracentesis without complications, as above.   Original Report Authenticated By: Trudie Reed, M.D.  Medications: I have reviewed the patient's current medications.  Assessment/Plan: 1. 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  History of breast cancer in 193 and 2003.  Details follow:  A. In 1993 the patient was diagnosed with right-sided breast cancer. She was treated with mastectomy on right,  chemotherapy including the "red devil"- Doxorubicin, and no radiation. This was treated in [place]. She reports that she did not require an anti-endocrine medication following the chemotherapy. She also reports that she had limited follow-up following her treatment. She reports 6-12 months worth of follow-up.   B. In 2003, she has a left breast cancer that was early stage. It was treated with a left mastectomy. She did not receive chemotherapy or radiation therapy. This was treated in [place], [place]. Will order CA 125 today.  Discussed the case with Dr. [name] (Pathology) and he informs me that the patient has an adenocarcinoma malignancy, unknown origin at this time.  However, with presentation, clinical picture, and CT scan results we should presume at this time ovarian cancer versus primary peritoneal carcinomatosis.  Dr. [name] will perform further immunohistochemistry staining and those results will be out tomorrow AM. 2. Abdominal pain, well controlled with current pain regimen.  Patient will need outpatient pain medication.  3. Thrombocytosis, reactive  4. Hypokalemia, repleted  5. History of breast cancer in 1993 and 2003.  Details in consult note dated 05/15/2012 6. HTN  7. Hyperlipidemia  8. Asthma  9. Thyroid disease 10. Patient has appointment on 05/24/2012 at 9 AM to see Dr. [name] for consultation and consideration for surgical intervention.  She is to arrive at 8:30 AM.  This scheduled appointment was printed and hand delivered to the patient and family, a copy is in paper chart as well.     LOS: 3 days    KEFALAS,THOMAS 05/17/2012 This patient most likely has a third primary with either ovarian ca or Primary Peritoneal carcinomatosis vs fallopian tube cancer.  She will need genetic testing at some point but more importantly needs to see GYN oncology for definitive surgical therapy. We will see after surgery consultation and I suspect after surgery for chemotherapy.

## 2012-05-17 NOTE — Progress Notes (Signed)
Saline lock removed. Discharge instructions given. Prescriptions given. Pt verbalized understanding of instructions. Left floor via wheelchair with nursing staff and family members.

## 2012-05-18 LAB — CA 125
CA 125: 1097.2 U/mL — ABNORMAL HIGH (ref 0.0–30.2)
CA 125: 1001.1 U/mL — ABNORMAL HIGH (ref 0.0–30.2)

## 2012-05-18 NOTE — Discharge Summary (Signed)
Physician Discharge Summary  Rita Humphrey:[id number] DOB: October 18, 1940 DOA: 05/14/2012  PCP: Carlota Raspberry, MD  Admit date: 05/14/2012 Discharge date: 05/17/2012  Time spent: Greater than 30 minutes  Recommendations for Outpatient Follow-up:  1. The patient will followup with Gyn-Onc, Dr. [name] for consultation and consideration for surgical intervention.  Discharge Diagnoses:   1. Abdominal pain secondary to peritoneal implants, omental caking, and drop metastasis to ovary/ovaries per CT scan of the abdomen. Ascites fluid pathology revealed adenocarcinoma, but the primary was unknown at the time of discharge. Further imaging to immunohistochemistry testing is pending. The diagnosis is presumed to be ovarian cancer versus primary peritoneal carcinomatosis. CEA 125; 1097. 2. Malignant ascites. 3. History of right-sided breast cancer in 1993, status post mastectomy and chemotherapy. History of left-sided breast cancer in 2003, status post mastectomy. (The patient did not receive chemotherapy or radiation therapy). 4. History of hysterectomy and unilateral oophorectomy (one ovary was left for hormonal reasons). 5. Thrombocytosis, reactive. 6. Hypokalemia. Repleted. 7. Hypertension. Stable. 8. Low back pain. 9. Hyperlipidemia. Stable. 10. Constipation.  Discharge Condition: Improved.  Diet recommendation: Heart healthy.  Filed Weights   05/14/12 0941 05/14/12 1643  Weight: 83.915 kg (185 lb) 83.915 kg (185 lb)    History of present illness:  The patient is a 72 year old woman with a history significant for a lateral breast cancer, status post bilateral mastectomy, chemotherapy in 1993, hypertension, and hyperlipidemia, who presented to the emergency department on 05/14/2012 with a chief complaint of abdominal pain. In the emergency department, she was afebrile and mildly tachycardic, but overall hemodynamically stable. Her lab data were significant for a serum sodium of 3.4, normal  liver transaminases, normal WBC, and platelet count of 490. CT of the abdomen and pelvis revealed moderate amount of ascites throughout the peritoneal cavity with diffuse peritoneal implants of tumor, omental caking, soft tissue masses in the adnexa consistent with drop metastasis to the ovaries versus ovarian malignancy, multiple rectal sigmoid colonic diverticula, and left lower lobe atelectasis. She was admitted for further evaluation and management.  Hospital Course:  The patient was started on supportive treatment with as needed IV analgesics and as needed IV antiemetics. Gentle IV fluids were started. Her potassium was repleted orally and in the IV fluids. Hydrochlorothiazide was withheld as it was thought to be the cause of hypokalemia. She was maintained on amlodipine for treatment of chronic hypertension. Most of not all of her other chronic medications were continued. For further evaluation, interventional radiology was consulted for an ultrasound-guided paracentesis for cytology. 180 cc of abdominal fluid was removed for evaluation. Oncology was consulted while the pathology results were pending. Oncology PA Mr. [name] evaluated the patient initially in consultation with oncologist, Dr. Mariel Sleet. They agreed with the management and diagnostic workup. We all waited for the pathology results. In the meantime, the patient was treated supportively for pain and given laxatives for symptomatic constipation.  On the day of discharge, Mr. [name] discussed the pathology results with pathologist, Dr. [name]. He reported that the cytology from the paracentesis revealed an adenocarcinoma, but he was unsure of the primary site at that time. Mr. [name] informed Dr. [name] that the patient had a past medical history significant for bilateral cancer. Therefore, Dr. [name] would perform more immunohistochemistry testing. Mr. [name] ordered a CA 125. It was elevated at 1097.  Although the primary site of the  patient's adenocarcinoma was not 100% certain, it was presumed to be ovarian cancer versus primary peritoneal carcinomatosis with peritoneal implants, omental  caking, and drop metastasis. Arrangements were made for the patient to followup with Dr. [name] on 05/24/2012 for consultation and consideration for surgical intervention as an outpatient. The patient and her family were informed.  At the time of discharge, the patient's pain was controlled. She was discharged on Vicodin as she stated that she needed nothing stronger than it for pain.  Procedures:  Ultrasound-guided paracentesis, per interventional radiology.  Consultations:  Oncology team: [name] M.D. and [name], [place]  Curbside discussion with gynecologist [name], M.D.  Discharge Exam: Filed Vitals:   05/16/12 0526 05/16/12 1438 05/16/12 2100 05/17/12 0500  BP: 123/70 136/78 127/79 143/82  Pulse: 87 100 90 96  Temp: 98 F (36.7 C) 98.5 F (36.9 C) 98.1 F (36.7 C) 98.8 F (37.1 C)  TempSrc:  Oral Oral Oral  Resp: 18 20 16 16   Height:      Weight:      SpO2: 97% 93% 95% 97%    General: Pleasant alert 72 year old woman sitting up in a chair, in no acute distress. Cardiovascular: S1, S2, with a soft systolic murmur. Respiratory: Decreased breath sounds in the bases, otherwise clear. Abdomen: Mildly obese, trace ascites, minimal tenderness over the lower abdomen, no distention, no rigidity or guarding.  Discharge Instructions  Discharge Orders   Future Appointments Provider Department Dept Phone   05/24/2012 9:00 AM [name], MD [facility] [phone]   Future Orders Complete By Expires     Diet - low sodium heart healthy  As directed     Increase activity slowly  As directed         Medication List    STOP taking these medications       hydrochlorothiazide 25 MG tablet  Commonly known as:  HYDRODIURIL      TAKE these medications        acetaminophen 500 MG tablet  Commonly known as:  TYLENOL  Take 1 tablet (500 mg total) by mouth every 6 (six) hours as needed for pain.     amLODipine 10 MG tablet  Commonly known as:  NORVASC  Take 10 mg by mouth daily.     CENTRUM SILVER ADULT 50+ PO  Take 1 tablet by mouth daily.     HYDROcodone-acetaminophen 5-325 MG per tablet  Commonly known as:  NORCO/VICODIN  Take 1 tablet by mouth every 6 (six) hours as needed for pain.     magnesium hydroxide 400 MG/5ML suspension  Commonly known as:  MILK OF MAGNESIA  Take 30 mLs by mouth daily as needed for constipation.     pravastatin 20 MG tablet  Commonly known as:  PRAVACHOL  Take 20 mg by mouth at bedtime.     senna-docusate 8.6-50 MG per tablet  Commonly known as:  Senokot-S  Take 1 tablet by mouth 2 (two) times daily.     VITAMIN B 12 PO  Take 1 tablet by mouth daily.           Follow-up Information   Follow up with [facility] MD On 05/24/2012. (Arrive at 8:30 AM for a 9 AM appt time)    Contact information:   [address] [place] [address] [phone]        The results of significant diagnostics from this hospitalization (including imaging, microbiology, ancillary and laboratory) are listed below for reference.    Significant Diagnostic Studies: Dg Cervical Spine 2 Or 3 Views  05/15/2012  *RADIOLOGY REPORT*  Clinical Data: Right-sided neck pain.  No known injury.  CERVICAL SPINE - 2-3 VIEW  Comparison: None  Findings: Normal alignment.  Prevertebral soft tissues are normal. Disc spaces are maintained.  No fracture.  IMPRESSION: No acute or significant bony abnormality.   Original Report Authenticated By: Charlett Nose, M.D.    Dg Lumbar Spine 2-3 Views  05/15/2012  *RADIOLOGY REPORT*  Clinical Data: Low back pain 3 years, worse last night, history of bilateral breast cancer  LUMBAR SPINE - 2-3 VIEW  Comparison: None Correlation:  CT abdomen pelvis 05/14/2012  Findings: Retained contrast within colon  from recent CT. This partially obscures the lumbar spine on the AP view. On lateral view, vertebral body heights appear maintained without fracture or subluxation. No gross evidence of bone destruction or spondylolysis. SI joints symmetric.  IMPRESSION: Osseous demineralization. No definite acute bony findings.   Original Report Authenticated By: Ulyses Southward, M.D.    Ct Abdomen Pelvis W Contrast  05/14/2012  *RADIOLOGY REPORT*  Clinical Data: Lower abdomen pain, history right breast carcinoma many years ago  CT ABDOMEN AND PELVIS WITH CONTRAST  Technique:  Multidetector CT imaging of the abdomen and pelvis was performed following the standard protocol during bolus administration of intravenous contrast.  Contrast: 50mL OMNIPAQUE IOHEXOL 300 MG/ML  SOLN, OMNIPAQUE IOHEXOL 300 MG/ML  SOLN  Comparison: CT abdomen pelvis of 10/21/2006  Findings: There is left lower lobe opacity possibly due to atelectasis with small left pleural effusion present.  The liver enhances with no focal abnormality and no ductal dilatation is seen.  However, there is a moderate amount of ascites throughout the peritoneal cavity.  Also there are multiple soft tissue lesions noted throughout the peritoneal cavity consistent with peritoneal implants of tumor with omental caking present as well.  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  No calcified gallstones are seen.  The pancreas is normal in size and the pancreatic duct is not dilated.  The adrenal glands and spleen are unremarkable.  The stomach is moderately distended with oral contrast.  The kidneys enhance with parapelvic cysts bilaterally.  No hydronephrosis or renal mass is seen. On delayed images the pelvocaliceal systems are unremarkable with some displacement by the parapelvic cyst, left larger than right.  The proximal ureters are normal in caliber.  The abdominal aorta is normal in caliber.  No adenopathy is seen.  Ascites is  noted throughout the pelvis as well with multiple peritoneal implants of tumor.  There are prominent adnexal soft tissue masses bilaterally.  On the left the soft tissue mass in the left adnexa measures 7.0 x 4.0 cm with the right adnexal soft tissue mass measuring 3.8 x 2.4 cm.  These may represent drop metastases, versus  ovarian malignancy.  The urinary bladder is partially decompressed but no gross abnormality is seen.  Multiple rectosigmoid colonic diverticula present.  The uterus has been previously resected.  A sclerotic focus is noted in the posterior right sacrum is most likely benign in origin.  The lumbar vertebrae are in normal alignment.  IMPRESSION:  1.  Moderate amount of ascites throughout the peritoneal cavity with diffuse peritoneal implants of tumor. Omental caking is present as well. 2.  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  Multiple rectosigmoid colonic diverticula. 4.  Left lower lobe atelectasis and/or pneumonia with small left pleural effusion.   Original Report Authenticated By: Dwyane Dee, M.D.    US  Paracentesis  05/15/2012  *RADIOLOGY REPORT*  Clinical Data: Probable malignant ascites from peritoneal metastases.  In need of fluid for cytology.  PARACENTESIS WITH ULTRASOUND GUIDANCE  Comparison:  No priors.  Procedure: Prior to beginning the procedure, the procedure was discussed with the patient and the risks, benefits and alternatives were addressed.  The patient voiced understanding of the discussion, and gave written and verbal consent to proceed.  A time- out procedure was then performed.  Ultrasound was utilized to identify a suitable entry site overlying the largest pocket of ascites in the left lower quadrant of the abdomen.  A small mark was placed on the patient's skin.  The patient was prepped and draped in the usual sterile fashion.   A small amount of 1% lidocaine was infiltrated to provide local  anesthesia.  Subsequently, a small incision was made in the patient's skin, and using direct ultrasound guidance a 7 [place] Yueh centesis needle was introduced into the peritoneal space. The needle was withdrawn leaving the centesis catheter in position. Three 60 ml syringes were filled with a straw colored ascites.  The centesis catheter was then withdrawn from the patient and a small bandage was placed on the patient's skin.  All fluid samples were sent to the laboratory for testing analysis per orders of the primary medical team.  IMPRESSION: 1.  Successful ultrasound-guided paracentesis without complications, as above.   Original Report Authenticated By: Trudie Reed, M.D.     Microbiology: No results found for this or any previous visit (from the past 240 hour(s)).   Labs: Basic Metabolic Panel:  Recent Labs Lab 05/14/12 1022 05/15/12 0545 05/16/12 0554 05/17/12 0528  NA 137 141 140 139  K 3.4* 3.3* 3.7 3.9  CL 99 104 108 106  CO2 28 27 25 24   GLUCOSE 111* 99 99 104*  BUN 11 7 9 10   CREATININE 0.75 0.63 0.59 0.54  CALCIUM 10.0 9.0 8.8 8.8  MG  --  1.9  --   --    Liver Function Tests:  Recent Labs Lab 05/14/12 1022  AST 15  ALT 16  ALKPHOS 96  BILITOT 0.4  PROT 7.8  ALBUMIN 3.3*    Recent Labs Lab 05/14/12 1022  LIPASE 19   No results found for this basename: AMMONIA,  in the last 168 hours CBC:  Recent Labs Lab 05/14/12 1022  WBC 9.3  NEUTROABS 6.2  HGB 13.5  HCT 42.2  MCV 82.9  PLT 490*   Cardiac Enzymes: No results found for this basename: CKTOTAL, CKMB, CKMBINDEX, TROPONINI,  in the last 168 hours BNP: BNP (last 3 results) No results found for this basename: PROBNP,  in the last 8760 hours CBG:  Recent Labs Lab 05/16/12 0800 05/16/12 1149  GLUCAP 102* 98       Signed:  FISHER,DENISE  [facility] 05/18/2012, 7:04 AM

## 2012-05-24 ENCOUNTER — Ambulatory Visit: Payer: Medicare Other | Attending: Gynecologic Oncology | Admitting: Gynecologic Oncology

## 2012-05-24 ENCOUNTER — Encounter: Payer: Self-pay | Admitting: Gynecologic Oncology

## 2012-05-24 VITALS — BP 128/68 | HR 80 | Temp 98.5°F | Resp 16 | Ht 63.0 in | Wt 196.4 lb

## 2012-05-24 DIAGNOSIS — C569 Malignant neoplasm of unspecified ovary: Secondary | ICD-10-CM | POA: Insufficient documentation

## 2012-05-24 DIAGNOSIS — Z853 Personal history of malignant neoplasm of breast: Secondary | ICD-10-CM | POA: Insufficient documentation

## 2012-05-24 NOTE — Patient Instructions (Signed)
Ovarian Cancer Ovarian cancer is cancer in the ovaries. The ovaries produce eggs in females. Most ovarian cancer is seen in women between the ages of 28 to 2. It happens more often in white women. Most patients do not have problems early in the disease. If this type of cancer is found in its later stages, survival is not as good.  CAUSES  There are no known causes of ovarian cancer. However, below are risk factors for ovarian cancer:   Aging.  [place] or [place] descent.  Personal or family history of endometrial, colon, or breast cancer and a family history of ovarian cancer.  Women with the BRCA 1 and BRCA 2 genes are at a very high risk of getting ovarian cancer.  The use of fertility medications may increase the risk of getting ovarian cancer.  Late menopause (after age 76).  Women who became pregnant for the first time at age 46 or older. Having these risk factors does not mean you will get ovarian cancer. But you should know about them and report any that you have to your caregiver. Also, a woman with none of the risk factors can still get ovarian cancer. SYMPTOMS  There may be no symptoms in some cases. Early ovarian cancer symptoms usually are minor and resemble other health problems. The following are symptoms that may be important to diagnosing cancer of the ovary:  Unexplained weight loss.  Increase abdominal size.  Pain in the abdomen.  Pain and pressure in the back and pelvis.  Indigestion, increased gas and bloating.  Abnormal vaginal bleeding.  Loss of appetite.  Frequent urination.  Painful sexual intercourse.  Tiredness. DIAGNOSIS   During an exam, an abnormal pelvis mass is found.  An ultrasound may be done.  X-ray, CT scan or MRI may be done.  Blood tests are taken. TREATMENT  All women with ovarian cancer should have careful and precise staging performed before treatment for the best outcome. In Stage I ovarian cancer, the  option of keeping the uterus and the normal ovary should be given. All treatment options should be discussed with the patient before any treatment is started. Types of treatment include:  Surgery to remove one or both ovaries. The surgery should be done by a doctor who is an expert in ovarian cancer. He/she should be well trained and have experience with gynecology oncology (cancer surgery).  Removal of both ovaries, both fallopian tubes and uterus. This can be done with or without removing the surrounding lymph nodes.  Chemotherapy. This therapy should be discussed with specialists in those fields.  Radiation therapy. This therapy should be discussed with specialists in those fields.  A combination of surgery, chemotherapy and radiation therapy may be recommended.  After treatment, your caregivers may want to do a "second look" operation. This is to see if the cancer is coming back and if extra treatment is needed. PREVENTION & SCREENING RECOMMENDATIONS  There is no way of preventing ovarian cancer. Regular exams do not improve early diagnosis and treatment success.  Ovary removal and tubal sterilization reduce the risk of ovarian cancer. But even removal of both ovaries may not be completely effective in preventing it.  Other protective factors include:  Having more than one full-term pregnancy.  Taking birth control pills.  Breastfeeding.  Having a tubal ligation. All of these factors reduce continual ovulation.  BRCA 1 and BRCA 2 blood test screening may be helpful for women with a strong family history for breast, ovary  and colon cancer.  Caregivers should take a thorough family history regarding breast, colon, ovarian and other cancers. Women at high risk should be counseled about the benefits and risks of ovarian cancer screening. Rectovaginal pelvic examinations should be done every year.  No data has shown that screening high-risk women reduces their mortality from ovarian  cancer. Still, the following are recommended in these women until childbearing is completed, or at least by age 51:  Yearly rectovaginal pelvic examinations.  CA-125 determinations (not reliable if normal and can be elevated with other noncancerous conditions such as fibroid tumors, endometriosis, pelvic infections, pregnancy and even during a menstrual period).  Transvaginal ultrasonography. Then preventative removal of both ovaries (prophylactic bilateral oophorectomy) is recommended. A small risk of developing peritoneal carcinomatosis remains following prophylactic oophorectomy.  A Pap test does not help diagnose ovarian cancer. But it is helpful in detecting cervical cancer.  New screening tests are being studied to try to help detect ovarian cancer in its early stages. HOME CARE INSTRUCTIONS   Inform your caregiver if you have had or a family member has had cancer.  Follow the advice of the specialists treating you.  Get a yearly physical and gynecology exams. This includes a rectovaginal pelvic exam, especially for women 60 years old and older even after you had treatment. SEEK MEDICAL CARE IF:   You have any of the symptoms listed above that have not gone away after treating them for a week.  You have back or pelvic pressure or pain.  You have pain during sexual intercourse.  You are losing weight for no known reason.  Your belly (abdomen) is getting bigger.  You have abnormal vaginal bleeding. Document Released: 01/12/2004 Document Revised: 05/16/2011 Document Reviewed: 09/20/2007 [facility] Patient Information 2013 [place], [place].

## 2012-05-24 NOTE — Progress Notes (Signed)
Consult Note: Gyn-Onc  Rita Humphrey 72 y.o. female  CC:  Chief Complaint  Patient presents with  . Ovarian Cancer    New Consult    HPI: Patient is seen today in consultation at the request of Dr. Mariel Sleet. Patient is a 72 year old gravida 3 para 3 who has been experiencing abdominal pain since November of 2013. Since December she has lost approximately 20 pounds and during that time is also noted early satiety and not be able to wear her close. She did not seek medical attention until February of this year. She describes her pain is staining pain it has increased since it started in November. She underwent a CT scan of the abdomen and pelvis on March 10. It reveals a moderate amount of ascites or peritoneal cavity. There are multiple soft tissue lesions noted throughout the peritoneal cavity consistent with peritoneal implants of tumor with omental caking as well. The abdominal structures are unremarkable. The pelvis she has ascites as well as multiple peritoneal implants of tumor. The prominent adnexal soft tissue masses bilaterally. On the left a soft tissue mass in the left adnexa measures 7 x 4 cm with a right adnexal soft tissue mass measuring 3.8 x 2.4 cm. She underwent a paracentesis on March 11. Pathology revealed malignant cells consistent with carcinoma. The malignant cells are positive for cytokeratin 7 and show a morphologic and immunophenotypic finding consistent with a gynecologic primary. CA 125 has been drawn that was over 1000.  The patient has a personal history of breast cancer. In 1993 she was diagnosed with a right-sided breast cancer. She was treated with mastectomy on the right and chemotherapy including doxorubicin. She does not require any radiation treatment. She reports she has limited followup. In 2003 show left-sided breast cancer that was early stage. She was treated with a left mastectomy and did not receive any adjuvant chemotherapy or radiation.  She never had any  genetic testing.   Review of Systems: Her review of systems is somewhat as above. She's not able to button her pants his December 2013 as early satiety with no nausea or vomiting. She's had a 20 pound unintentional weight loss. She has chills she has not checked her temperature but is not feels that she has had any fevers. She has a long-standing history of constipation for which she uses milk of magnesia with results. She does have some soreness of breath with activity that is also been new since these diagnoses. There is no associated chest pain or diaphoresis. She can walk around okay has increased work of breathing. She stay she's not had the occasion to go up a flight of stairs he doesn't know if she could. Her by mouth intake is decreased but there's been no nausea. She does complain of thirst and a dry mouth. She had a rash on her tongue consistent with thrush and took some of her family members medications and that has improved. Review of systems 410 systems is otherwise negative.  Current Meds:  Outpatient Encounter Prescriptions as of 05/24/2012  Medication Sig Dispense Refill  . acetaminophen (TYLENOL) 500 MG tablet Take 1 tablet (500 mg total) by mouth every 6 (six) hours as needed for pain.      Marland Kitchen amLODipine (NORVASC) 10 MG tablet Take 10 mg by mouth daily.      . Cyanocobalamin (VITAMIN B 12 PO) Take 1 tablet by mouth daily.      Marland Kitchen HYDROcodone-acetaminophen (NORCO/VICODIN) 5-325 MG per tablet Take 1 tablet by  mouth every 6 (six) hours as needed for pain.  30 tablet  1  . magnesium hydroxide (MILK OF MAGNESIA) 400 MG/5ML suspension Take 30 mLs by mouth daily as needed for constipation.      . Multiple Vitamins-Minerals (CENTRUM SILVER ADULT 50+ PO) Take 1 tablet by mouth daily.      . pravastatin (PRAVACHOL) 20 MG tablet Take 20 mg by mouth at bedtime.      . senna-docusate (SENOKOT-S) 8.6-50 MG per tablet Take 1 tablet by mouth 2 (two) times daily.       No facility-administered  encounter medications on file as of 05/24/2012.    Allergy:  Allergies  Allergen Reactions  . Penicillins Anaphylaxis and Nausea And Vomiting  . Morphine And Related Nausea And Vomiting    Social Hx:   History   Social History  . Marital Status: Widowed    Spouse Name: N/A    Number of Children: N/A  . Years of Education: N/A   Occupational History  . Not on file.   Social History Main Topics  . Smoking status: Never Smoker   . Smokeless tobacco: Not on file  . Alcohol Use: No  . Drug Use: No  . Sexually Active: No   Other Topics Concern  . Not on file   Social History Narrative  . No narrative on file    Past Surgical Hx: Breast cancer surgery in 1993 and 2003. Bladder tacking in 1982 Past Surgical History  Procedure Laterality Date  . Abdominal hysterectomy    . Mastectomy    . Vascular surgery    . Thyroid lobectomy    . Foot surgery      2 spurs taken out of foot    Past Medical Hx:  Past Medical History  Diagnosis Date  . Hypertension   . Hyperlipemia   . CHF (congestive heart failure)   . Thyroid disease   . Asthma   . Cancer     [place]  . Breast cancer     s/p bilateral mastectomy [id number]  . Ovarian cancer 05/17/2012  . 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 Adnexal mass 05/16/2012    Family Hx: She has a niece with breast cancer diagnosed at the age of 43. She never underwent any genetic testing that she is aware of. Family History  Problem Relation Age of Onset  . Cancer Mother     throat cancer  . Hypertension Father   . Heart attack Brother     5 brothers  . Hypertension Sister     Vitals:  Blood pressure 128/68, pulse 80, temperature 98.5 F (36.9 C), resp. rate 16, height 5\' 3"  (1.6 m), weight 196 lb 6.4 oz (89.086 kg).  Physical Exam: Well-nourished well-developed female in no acute distress.  Neck: Supple, no lymphadenopathy, no thyromegaly.  Cardiovascular regular rate and rhythm.  Lungs: Clear to auscultation  bilaterally.  Abdomen obese soft positive fluid wave. There is a sense of an abdominal pelvic mass in the left lower quadrant. There is tenderness in the left lower quadrant. There is no rebound or guarding.  Groins: No lymphadenopathy.  Extremities: A 2+ nonpitting edema. Varicose veins.  Pelvic: Normal external female genitalia. Bimanual examination reveals an 8 cm pelvic mass adherent to the vaginal cuff. It is smooth. Just tender. It is nonmobile. On rectovaginal exam there is no nodularity.  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 In the  setting of her having breast cancer twice, I feel that this may be related to a BRCA mutation. The patient will require postoperative genetic testing as she has daughters.  I met with the patient and her daughter and her stepdaughter today. She'll need to undergo primary debulking surgery. This will include exploratory laparotomy BSO and attempt at optimal cytoreduction. Risks of surgery include but are not limited to bleeding infection injury to surrounding organs colostomy and venous thromboembolic disease. Understand that she'll need additional treatment in the form of chemotherapy postoperatively.  They had several questions regarding whether or not performing surgery would cause spread of cancer. I discussed with them that her cancer has already spread. It appears that it is stage III and that operating on cancer does not make the cancer spread. They had several questions regarding ovarian cancer and statistics. I discussed with them that the majority of our patients present with stage 3-4 ovarian cancer. And despite this, we were able to get approximately 80% of our patients into remission. I cannot predict for them at this time that if she gets into remission how long that remission may last as the majority of our patients will ultimately  suffer a recurrence. The patient states she had phlebitis and needed vein stripping for that she also had phlebitis during her pregnancy. Does not sound like she had any deep venous thromboses. However, she will receive prophylactic Lovenox perioperatively as well as lives in the postoperative setting per protocol. We discussed the risk of colostomy. She is amenable to her colostomy if it results in optimal cytoreduction.  Their questions were elicited in answer to their satisfaction.  Maisyn Nouri A., MD 05/24/2012, 9:14 AM

## 2012-05-25 ENCOUNTER — Other Ambulatory Visit (HOSPITAL_COMMUNITY): Payer: Self-pay | Admitting: Oncology

## 2012-05-25 NOTE — Progress Notes (Signed)
We did discuss with her but we did not set that up

## 2012-05-28 ENCOUNTER — Telehealth: Payer: Self-pay | Admitting: Genetic Counselor

## 2012-05-28 NOTE — Telephone Encounter (Signed)
CALLED PT TO SCHEDULED GENETIC APPT PER PATIENT SHE STATES SHE CANNOT AFFORD IT RIGHT NOW. CALLED MAZZIE TO INFORM.

## 2012-05-30 ENCOUNTER — Encounter (HOSPITAL_COMMUNITY): Payer: Self-pay | Admitting: Pharmacy Technician

## 2012-05-31 NOTE — Patient Instructions (Signed)
Rita Humphrey  05/31/2012   Your procedure is scheduled on: 06/05/12    Report to [facility] at   0500  AM.  Call this number if you have problems the morning of surgery: [phone]   Remember:   Do not eat food or drink liquids after midnight.   Take these medicines the morning of surgery with A SIP OF WATER:    Do not wear jewelry, make-up or nail polish.  Do not wear lotions, powders, or perfumes.   Do not shave 48 hours prior to surgery.   Do not bring valuables to the hospital.  Contacts, dentures or bridgework may not be worn into surgery.  Leave suitcase in the car. After surgery it may be brought to your room.  For patients admitted to the hospital, checkout time is 11:00 AM the day of  discharge.   SEE CHG INSTRUCTION SHEET    Please read over the following fact sheets that you were given: MRSA Information, coughing and deep breathing exercises, leg exercises, Blood transfusion Fact sheet, Incentive Spirometry fact sheet                Failure to comply with these instructions may result in cancellation of your surgery.                Patient Signature ____________________________              Nurse Signature _____________________________

## 2012-06-01 ENCOUNTER — Encounter (HOSPITAL_COMMUNITY): Payer: Self-pay

## 2012-06-01 ENCOUNTER — Other Ambulatory Visit: Payer: Self-pay

## 2012-06-01 ENCOUNTER — Encounter (HOSPITAL_COMMUNITY)
Admission: RE | Admit: 2012-06-01 | Discharge: 2012-06-01 | Disposition: A | Discharge status: Home / Self Care | Payer: Medicare Other | Source: Ambulatory Visit | Attending: Gynecologic Oncology | Admitting: Gynecologic Oncology

## 2012-06-01 ENCOUNTER — Ambulatory Visit (HOSPITAL_COMMUNITY)
Admission: RE | Admit: 2012-06-01 | Discharge: 2012-06-01 | Disposition: A | Discharge status: Home / Self Care | Payer: Medicare Other | Source: Ambulatory Visit | Attending: Gynecologic Oncology | Admitting: Gynecologic Oncology

## 2012-06-01 DIAGNOSIS — C569 Malignant neoplasm of unspecified ovary: Secondary | ICD-10-CM | POA: Insufficient documentation

## 2012-06-01 DIAGNOSIS — Z01818 Encounter for other preprocedural examination: Secondary | ICD-10-CM | POA: Insufficient documentation

## 2012-06-01 DIAGNOSIS — I498 Other specified cardiac arrhythmias: Secondary | ICD-10-CM | POA: Insufficient documentation

## 2012-06-01 DIAGNOSIS — Z01812 Encounter for preprocedural laboratory examination: Secondary | ICD-10-CM | POA: Insufficient documentation

## 2012-06-01 DIAGNOSIS — R9431 Abnormal electrocardiogram [ECG] [EKG]: Secondary | ICD-10-CM | POA: Insufficient documentation

## 2012-06-01 DIAGNOSIS — Z01810 Encounter for preprocedural cardiovascular examination: Secondary | ICD-10-CM | POA: Insufficient documentation

## 2012-06-01 DIAGNOSIS — J90 Pleural effusion, not elsewhere classified: Secondary | ICD-10-CM | POA: Insufficient documentation

## 2012-06-01 HISTORY — DX: Unspecified osteoarthritis, unspecified site: M19.90

## 2012-06-01 HISTORY — DX: Peripheral vascular disease, unspecified: I73.9

## 2012-06-01 LAB — CBC WITH DIFFERENTIAL/PLATELET
Basophils Absolute: 0 10*3/uL (ref 0.0–0.1)
Lymphocytes Relative: 25 % (ref 12–46)
Neutro Abs: 5.2 10*3/uL (ref 1.7–7.7)
Platelets: 686 10*3/uL — ABNORMAL HIGH (ref 150–400)
RDW: 14.2 % (ref 11.5–15.5)
WBC: 8.3 10*3/uL (ref 4.0–10.5)

## 2012-06-01 LAB — COMPREHENSIVE METABOLIC PANEL
ALT: 18 U/L (ref 0–35)
AST: 21 U/L (ref 0–37)
CO2: 26 mEq/L (ref 19–32)
Calcium: 9.9 mg/dL (ref 8.4–10.5)
Chloride: 98 mEq/L (ref 96–112)
GFR calc non Af Amer: 82 mL/min — ABNORMAL LOW (ref >90)
Sodium: 137 mEq/L (ref 135–145)
Total Bilirubin: 0.2 mg/dL — ABNORMAL LOW (ref 0.3–1.2)

## 2012-06-01 LAB — SURGICAL PCR SCREEN: Staphylococcus aureus: NEGATIVE

## 2012-06-01 LAB — ABO/RH: ABO/RH(D): O POS

## 2012-06-01 NOTE — Progress Notes (Signed)
Your patient has screened at an elevated risk for Obstructive Sleep Apnea using the STOP-Bang Tool during a Presurgical Visit.  A score of 4 or greater is considered an elevated risk.

## 2012-06-01 NOTE — Progress Notes (Signed)
Patient in today for preop appointment.  Initial pulse was 112 on dinemapp.  Manual pulse was 112 and regular.  Sat 95% B/P 148/88.  Temp 96.8.  Preop appointment completed. EKG done- Rate 105 Sinus Tach on EKG.  Patient states she has had productive white cough since 05/30/12.  Patient has cough at preop appointment .  Patient denies any fever.  Labs of CBC/Diff and CMP completed along with type and Screen.  Patient states she has been taking over the counter [employer] Sudafed along with Benadryl and Delsym.  Patient reports improvement in symptoms since 05/30/12.  Contacted office of PCP , Dr Carlota Raspberry , at 1330pm.  Spoke with Zella Ball, nurse of Dr Carlota Raspberry and she stated office was closing for the day.  Informed nurse of vital signs along with above and told nurse patient for surgery on 06/05/12.  She again stated office was closing for the day and Dr Carlota Raspberry was not in office on 06/01/12.  I informed her that any abnormal labs, ekg or CXR reports would be sent to surgeon of Dr Duard Brady.  Patient was notified along with daughter that I had attempted to call office of Dr Carlota Raspberry.  Instructed patient and daughter to call office of Dr Carlota Raspberry and to notify the MD on call of symptoms.  Patient and daughter aware that any abnormalities would be sent to Dr Duard Brady.  Patient and daughter wanted to know what medications patient could take.  Instructed patient and daughter that I could not instruct patient as to what medications patient should take.  I instructed her to notify her PCP  And receive instructions from them and if patient develops more symptoms or becomes worse to notify PCP.

## 2012-06-01 NOTE — Progress Notes (Signed)
Faxed to Dr Duard Brady at [phone] ekg, labs, cxr and note written in [facility] regarding today's preop visit.  Received confirmation .  Spoke with Malva Limes, nurse of office of Dr Duard Brady in [place]  And made her aware of patient status and that information would be faxed.  Called Shameekia back and she stated she would inform and give information to Dr Duard Brady.

## 2012-06-04 NOTE — Anesthesia Preprocedure Evaluation (Addendum)
Anesthesia Evaluation  Patient identified by MRN, date of birth, ID band Patient awake    Reviewed: Allergy & Precautions, H&P , NPO status , Patient's Chart, lab work & pertinent test results  Airway Mallampati: II TM Distance: >3 FB Neck ROM: Full    Dental  (+) Dental Advisory Given, Partial Upper and Partial Lower   Pulmonary shortness of breath, asthma ,  breath sounds clear to auscultation        Cardiovascular hypertension, Pt. on medications + Peripheral Vascular Disease Rhythm:Regular Rate:Normal     Neuro/Psych negative neurological ROS  negative psych ROS   GI/Hepatic negative GI ROS, Neg liver ROS,   Endo/Other  negative endocrine ROS  Renal/GU negative Renal ROS     Musculoskeletal negative musculoskeletal ROS (+)   Abdominal   Peds  Hematology negative hematology ROS (+)   Anesthesia Other Findings   Reproductive/Obstetrics                         Anesthesia Physical Anesthesia Plan  ASA: II  Anesthesia Plan: General   Post-op Pain Management:    Induction: Intravenous  Airway Management Planned: Oral ETT  Additional Equipment:   Intra-op Plan:   Post-operative Plan: Extubation in OR  Informed Consent: I have reviewed the patients History and Physical, chart, labs and discussed the procedure including the risks, benefits and alternatives for the proposed anesthesia with the patient or authorized representative who has indicated his/her understanding and acceptance.   Dental advisory given  Plan Discussed with: CRNA  Anesthesia Plan Comments:         Anesthesia Quick Evaluation

## 2012-06-05 ENCOUNTER — Encounter (HOSPITAL_COMMUNITY): Payer: Self-pay | Admitting: *Deleted

## 2012-06-05 ENCOUNTER — Inpatient Hospital Stay (HOSPITAL_COMMUNITY): Payer: Medicare Other | Admitting: Anesthesiology

## 2012-06-05 ENCOUNTER — Inpatient Hospital Stay (HOSPITAL_COMMUNITY)
Admission: RE | Admit: 2012-06-05 | Discharge: 2012-06-08 | DRG: 749 | Disposition: A | Discharge status: Home / Self Care | Payer: Medicare Other | Source: Ambulatory Visit | Attending: Obstetrics & Gynecology | Admitting: Obstetrics & Gynecology

## 2012-06-05 ENCOUNTER — Encounter (HOSPITAL_COMMUNITY): Payer: Self-pay | Admitting: Anesthesiology

## 2012-06-05 ENCOUNTER — Encounter (HOSPITAL_COMMUNITY)
Admission: RE | Disposition: A | Discharge status: Home / Self Care | Payer: Self-pay | Source: Ambulatory Visit | Attending: Obstetrics & Gynecology

## 2012-06-05 DIAGNOSIS — I10 Essential (primary) hypertension: Secondary | ICD-10-CM | POA: Diagnosis present

## 2012-06-05 DIAGNOSIS — N9489 Other specified conditions associated with female genital organs and menstrual cycle: Secondary | ICD-10-CM

## 2012-06-05 DIAGNOSIS — E669 Obesity, unspecified: Secondary | ICD-10-CM | POA: Diagnosis present

## 2012-06-05 DIAGNOSIS — E785 Hyperlipidemia, unspecified: Secondary | ICD-10-CM | POA: Diagnosis present

## 2012-06-05 DIAGNOSIS — D696 Thrombocytopenia, unspecified: Secondary | ICD-10-CM

## 2012-06-05 DIAGNOSIS — Z79899 Other long term (current) drug therapy: Secondary | ICD-10-CM

## 2012-06-05 DIAGNOSIS — C801 Malignant (primary) neoplasm, unspecified: Secondary | ICD-10-CM | POA: Diagnosis present

## 2012-06-05 DIAGNOSIS — R630 Anorexia: Secondary | ICD-10-CM | POA: Diagnosis present

## 2012-06-05 DIAGNOSIS — R180 Malignant ascites: Secondary | ICD-10-CM | POA: Diagnosis present

## 2012-06-05 DIAGNOSIS — I739 Peripheral vascular disease, unspecified: Secondary | ICD-10-CM | POA: Diagnosis present

## 2012-06-05 DIAGNOSIS — C569 Malignant neoplasm of unspecified ovary: Principal | ICD-10-CM | POA: Diagnosis present

## 2012-06-05 DIAGNOSIS — Z853 Personal history of malignant neoplasm of breast: Secondary | ICD-10-CM

## 2012-06-05 DIAGNOSIS — J45909 Unspecified asthma, uncomplicated: Secondary | ICD-10-CM | POA: Diagnosis present

## 2012-06-05 DIAGNOSIS — M545 Low back pain, unspecified: Secondary | ICD-10-CM | POA: Diagnosis present

## 2012-06-05 DIAGNOSIS — C786 Secondary malignant neoplasm of retroperitoneum and peritoneum: Secondary | ICD-10-CM

## 2012-06-05 DIAGNOSIS — Z6834 Body mass index (BMI) 34.0-34.9, adult: Secondary | ICD-10-CM

## 2012-06-05 HISTORY — PX: LAPAROTOMY: SHX154

## 2012-06-05 HISTORY — DX: Thrombocytopenia, unspecified: D69.6

## 2012-06-05 LAB — TYPE AND SCREEN

## 2012-06-05 SURGERY — LAPAROTOMY, EXPLORATORY
Anesthesia: General | Site: Abdomen | Laterality: Bilateral | Wound class: Clean

## 2012-06-05 MED ORDER — CISATRACURIUM BESYLATE (PF) 10 MG/5ML IV SOLN
INTRAVENOUS | Status: DC | PRN
  Administered 2012-06-05: 8 mg via INTRAVENOUS

## 2012-06-05 MED ORDER — ACETAMINOPHEN 10 MG/ML IV SOLN
1000.0000 mg | Freq: Four times a day (QID) | INTRAVENOUS | Status: AC
  Administered 2012-06-05 – 2012-06-06 (×3): 1000 mg via INTRAVENOUS
  Filled 2012-06-05 (×5): qty 100

## 2012-06-05 MED ORDER — OXYCODONE-ACETAMINOPHEN 5-325 MG PO TABS
1.0000 | ORAL_TABLET | ORAL | Status: DC | PRN
  Administered 2012-06-06 – 2012-06-08 (×4): 2 via ORAL
  Filled 2012-06-05 (×4): qty 2

## 2012-06-05 MED ORDER — ONDANSETRON HCL 4 MG/2ML IJ SOLN
4.0000 mg | Freq: Four times a day (QID) | INTRAMUSCULAR | Status: DC | PRN
  Administered 2012-06-05: 4 mg via INTRAVENOUS
  Filled 2012-06-05: qty 2

## 2012-06-05 MED ORDER — ACETAMINOPHEN 10 MG/ML IV SOLN: 1000.0000 mg | Freq: Once | INTRAVENOUS | Status: DC | PRN

## 2012-06-05 MED ORDER — PROMETHAZINE HCL 25 MG/ML IJ SOLN: 6.2500 mg | INTRAMUSCULAR | Status: DC | PRN

## 2012-06-05 MED ORDER — MEPERIDINE HCL 50 MG/ML IJ SOLN: 6.2500 mg | INTRAMUSCULAR | Status: DC | PRN

## 2012-06-05 MED ORDER — METRONIDAZOLE IN NACL 5-0.79 MG/ML-% IV SOLN
500.0000 mg | INTRAVENOUS | Status: AC
  Administered 2012-06-05: .5 g via INTRAVENOUS

## 2012-06-05 MED ORDER — LIDOCAINE HCL (CARDIAC) 20 MG/ML IV SOLN
INTRAVENOUS | Status: DC | PRN
  Administered 2012-06-05: 75 mg via INTRAVENOUS

## 2012-06-05 MED ORDER — FENTANYL CITRATE 0.05 MG/ML IJ SOLN
INTRAMUSCULAR | Status: DC | PRN
  Administered 2012-06-05 (×2): 50 ug via INTRAVENOUS

## 2012-06-05 MED ORDER — ENOXAPARIN SODIUM 40 MG/0.4ML ~~LOC~~ SOLN
40.0000 mg | Freq: Every day | SUBCUTANEOUS | Status: DC
  Administered 2012-06-06 – 2012-06-08 (×3): 40 mg via SUBCUTANEOUS
  Filled 2012-06-05 (×4): qty 0.4

## 2012-06-05 MED ORDER — SUFENTANIL CITRATE 50 MCG/ML IV SOLN
INTRAVENOUS | Status: DC | PRN
  Administered 2012-06-05 (×2): 5 ug via INTRAVENOUS

## 2012-06-05 MED ORDER — ONDANSETRON HCL 4 MG PO TABS: 4.0000 mg | ORAL_TABLET | Freq: Four times a day (QID) | ORAL | Status: DC | PRN

## 2012-06-05 MED ORDER — HYDROMORPHONE HCL PF 1 MG/ML IJ SOLN
0.2500 mg | INTRAMUSCULAR | Status: DC | PRN
  Administered 2012-06-05 (×4): 0.5 mg via INTRAVENOUS

## 2012-06-05 MED ORDER — SIMVASTATIN 10 MG PO TABS
10.0000 mg | ORAL_TABLET | Freq: Every day | ORAL | Status: DC
  Administered 2012-06-05 – 2012-06-07 (×3): 10 mg via ORAL
  Filled 2012-06-05 (×5): qty 1

## 2012-06-05 MED ORDER — DIPHENHYDRAMINE HCL 12.5 MG/5ML PO ELIX: 12.5000 mg | ORAL_SOLUTION | Freq: Four times a day (QID) | ORAL | Status: DC | PRN

## 2012-06-05 MED ORDER — SUCCINYLCHOLINE CHLORIDE 20 MG/ML IJ SOLN
INTRAMUSCULAR | Status: DC | PRN
  Administered 2012-06-05: 100 mg via INTRAVENOUS

## 2012-06-05 MED ORDER — MAGNESIUM HYDROXIDE 400 MG/5ML PO SUSP
30.0000 mL | Freq: Three times a day (TID) | ORAL | Status: AC
  Administered 2012-06-06: 30 mL via ORAL
  Filled 2012-06-05 (×3): qty 30

## 2012-06-05 MED ORDER — ENOXAPARIN SODIUM 40 MG/0.4ML ~~LOC~~ SOLN
40.0000 mg | SUBCUTANEOUS | Status: AC
  Administered 2012-06-05: 40 mg via SUBCUTANEOUS
  Filled 2012-06-05: qty 0.4

## 2012-06-05 MED ORDER — SODIUM CHLORIDE 0.9 % IJ SOLN: 9.0000 mL | INTRAMUSCULAR | Status: DC | PRN

## 2012-06-05 MED ORDER — NALOXONE HCL 0.4 MG/ML IJ SOLN: 0.4000 mg | INTRAMUSCULAR | Status: DC | PRN

## 2012-06-05 MED ORDER — ZOLPIDEM TARTRATE 5 MG PO TABS
5.0000 mg | ORAL_TABLET | Freq: Every evening | ORAL | Status: DC | PRN
  Administered 2012-06-07: 5 mg via ORAL
  Filled 2012-06-05: qty 1

## 2012-06-05 MED ORDER — FAMOTIDINE IN NACL 20-0.9 MG/50ML-% IV SOLN
20.0000 mg | INTRAVENOUS | Status: AC
  Administered 2012-06-05: 20 mg via INTRAVENOUS
  Filled 2012-06-05: qty 50

## 2012-06-05 MED ORDER — KCL IN DEXTROSE-NACL 20-5-0.45 MEQ/L-%-% IV SOLN
INTRAVENOUS | Status: DC
  Administered 2012-06-05 – 2012-06-06 (×3): via INTRAVENOUS
  Filled 2012-06-05 (×4): qty 1000

## 2012-06-05 MED ORDER — BUPIVACAINE LIPOSOME 1.3 % IJ SUSP
20.0000 mL | Freq: Once | INTRAMUSCULAR | Status: DC
  Filled 2012-06-05: qty 20

## 2012-06-05 MED ORDER — PROPOFOL 10 MG/ML IV EMUL
INTRAVENOUS | Status: DC | PRN
  Administered 2012-06-05: 175 mg via INTRAVENOUS

## 2012-06-05 MED ORDER — AMLODIPINE BESYLATE 10 MG PO TABS
10.0000 mg | ORAL_TABLET | Freq: Every day | ORAL | Status: DC
  Administered 2012-06-06 – 2012-06-08 (×3): 10 mg via ORAL
  Filled 2012-06-05 (×3): qty 1

## 2012-06-05 MED ORDER — LACTATED RINGERS IV SOLN
INTRAVENOUS | Status: DC | PRN
  Administered 2012-06-05 (×2): via INTRAVENOUS

## 2012-06-05 MED ORDER — SODIUM CHLORIDE 0.9 % IJ SOLN
INTRAMUSCULAR | Status: DC | PRN
  Administered 2012-06-05: 08:00:00

## 2012-06-05 MED ORDER — 0.9 % SODIUM CHLORIDE (POUR BTL) OPTIME
TOPICAL | Status: DC | PRN
  Administered 2012-06-05 (×2): 1000 mL

## 2012-06-05 MED ORDER — NEOSTIGMINE METHYLSULFATE 1 MG/ML IJ SOLN
INTRAMUSCULAR | Status: DC | PRN
  Administered 2012-06-05: 3 mg via INTRAVENOUS

## 2012-06-05 MED ORDER — MIDAZOLAM HCL 5 MG/5ML IJ SOLN
INTRAMUSCULAR | Status: DC | PRN
  Administered 2012-06-05: 1 mg via INTRAVENOUS

## 2012-06-05 MED ORDER — ACETAMINOPHEN 10 MG/ML IV SOLN
INTRAVENOUS | Status: DC | PRN
  Administered 2012-06-05: 1000 mg via INTRAVENOUS

## 2012-06-05 MED ORDER — GLYCOPYRROLATE 0.2 MG/ML IJ SOLN
INTRAMUSCULAR | Status: DC | PRN
  Administered 2012-06-05: .4 mg via INTRAVENOUS

## 2012-06-05 MED ORDER — CIPROFLOXACIN IN D5W 400 MG/200ML IV SOLN
400.0000 mg | INTRAVENOUS | Status: AC
  Administered 2012-06-05: 400 mg via INTRAVENOUS

## 2012-06-05 MED ORDER — DIPHENHYDRAMINE HCL 50 MG/ML IJ SOLN: 12.5000 mg | Freq: Four times a day (QID) | INTRAMUSCULAR | Status: DC | PRN

## 2012-06-05 MED ORDER — METOCLOPRAMIDE HCL 5 MG/ML IJ SOLN
INTRAMUSCULAR | Status: DC | PRN
  Administered 2012-06-05: 5 mg via INTRAVENOUS

## 2012-06-05 MED ORDER — DEXAMETHASONE SODIUM PHOSPHATE 10 MG/ML IJ SOLN
INTRAMUSCULAR | Status: DC | PRN
  Administered 2012-06-05: 8 mg via INTRAVENOUS

## 2012-06-05 MED ORDER — HYDROMORPHONE 0.3 MG/ML IV SOLN
INTRAVENOUS | Status: DC
  Administered 2012-06-05: 0.999 mg via INTRAVENOUS
  Administered 2012-06-05: 0.3 mg via INTRAVENOUS
  Administered 2012-06-06: 1.59 mg via INTRAVENOUS
  Administered 2012-06-06 (×2): 1.79 mg via INTRAVENOUS

## 2012-06-05 MED ORDER — CITRIC ACID-SODIUM CITRATE 334-500 MG/5ML PO SOLN
ORAL | Status: DC | PRN
  Administered 2012-06-05: 10 mL via ORAL

## 2012-06-05 MED ORDER — ONDANSETRON HCL 4 MG/2ML IJ SOLN
INTRAMUSCULAR | Status: DC | PRN
  Administered 2012-06-05 (×2): 2 mg via INTRAVENOUS

## 2012-06-05 SURGICAL SUPPLY — 35 items
ATTRACTOMAT 16X20 MAGNETIC DRP (DRAPES) ×2 IMPLANT
BAG URINE DRAINAGE (UROLOGICAL SUPPLIES) ×2 IMPLANT
BLADE EXTENDED COATED 6.5IN (ELECTRODE) ×2 IMPLANT
CANISTER SUCTION 2500CC (MISCELLANEOUS) ×2 IMPLANT
CATH FOLEY 2WAY SLVR  5CC 16FR (CATHETERS) ×1
CATH FOLEY 2WAY SLVR 5CC 16FR (CATHETERS) ×1 IMPLANT
CLIP TI MEDIUM LARGE 6 (CLIP) ×4 IMPLANT
CLOTH BEACON ORANGE TIMEOUT ST (SAFETY) ×2 IMPLANT
DRAPE TABLE BACK 44X90 PK DISP (DRAPES) ×2 IMPLANT
DRAPE WARM FLUID 44X44 (DRAPE) ×2 IMPLANT
DRSG TELFA 4X14 ISLAND ADH (GAUZE/BANDAGES/DRESSINGS) ×2 IMPLANT
ELECT REM PT RETURN 9FT ADLT (ELECTROSURGICAL) ×2
ELECTRODE REM PT RTRN 9FT ADLT (ELECTROSURGICAL) ×1 IMPLANT
GAUZE SPONGE 4X4 16PLY XRAY LF (GAUZE/BANDAGES/DRESSINGS) IMPLANT
GLOVE BIO SURGEON STRL SZ 6.5 (GLOVE) ×2 IMPLANT
GLOVE BIO SURGEON STRL SZ7.5 (GLOVE) ×4 IMPLANT
GLOVE BIOGEL PI IND STRL 7.0 (GLOVE) ×1 IMPLANT
GLOVE BIOGEL PI INDICATOR 7.0 (GLOVE) ×1
HOLDER FOLEY CATH W/STRAP (MISCELLANEOUS) ×2 IMPLANT
NEEDLE HYPO 25X1 1.5 SAFETY (NEEDLE) ×2 IMPLANT
NS IRRIG 1000ML POUR BTL (IV SOLUTION) ×8 IMPLANT
PACK ABDOMINAL WL (CUSTOM PROCEDURE TRAY) ×2 IMPLANT
SHEET LAVH (DRAPES) ×2 IMPLANT
SPONGE GAUZE 4X4 12PLY (GAUZE/BANDAGES/DRESSINGS) ×2 IMPLANT
SPONGE LAP 18X18 X RAY DECT (DISPOSABLE) ×6 IMPLANT
STAPLER VISISTAT 35W (STAPLE) ×2 IMPLANT
SUT PDS AB 1 CTXB1 36 (SUTURE) ×4 IMPLANT
SUT VIC AB 0 CT1 36 (SUTURE) ×16 IMPLANT
SUT VIC AB 2-0 CT2 27 (SUTURE) IMPLANT
SUT VICRYL 2 0 18  UND BR (SUTURE) ×1
SUT VICRYL 2 0 18 UND BR (SUTURE) ×1 IMPLANT
SYR 20CC LL (SYRINGE) ×2 IMPLANT
SYR CONTROL 10ML LL (SYRINGE) ×2 IMPLANT
TAPE CLOTH SURG 6X10 WHT LF (GAUZE/BANDAGES/DRESSINGS) ×2 IMPLANT
TOWEL OR 17X26 10 PK STRL BLUE (TOWEL DISPOSABLE) ×2 IMPLANT

## 2012-06-05 NOTE — Addendum Note (Signed)
Addendum created 06/05/12 1448 by Trellis Paganini, CRNA   Modules edited: Anesthesia Medication Administration

## 2012-06-05 NOTE — Progress Notes (Signed)
Discussed with patient that she is full code and everything will be done for life saving measures states this is what she prefers while in hospital, but has a living will.

## 2012-06-05 NOTE — Transfer of Care (Signed)
Immediate Anesthesia Transfer of Care Note  Patient: Rita Humphrey  Procedure(s) Performed: Procedure(s): EXPLORATORY LAPAROTOMY WITH PELVIC TUMOR BIOPSY (Bilateral)  Patient Location: PACU  Anesthesia Type:General  Level of Consciousness: awake, alert , patient cooperative and responds to stimulation  Airway & Oxygen Therapy: Patient Spontanous Breathing and Patient connected to face mask oxygen  Post-op Assessment: Report given to PACU RN, Post -op Vital signs reviewed and stable and Patient moving all extremities  Post vital signs: Reviewed and stable  Complications: No apparent anesthesia complications

## 2012-06-05 NOTE — H&P (View-Only) (Signed)
Consult Note: Gyn-Onc  Rita Humphrey 72 y.o. female  CC:  Chief Complaint  Patient presents with  . Ovarian Cancer    New Consult    HPI: Patient is seen today in consultation at the request of Dr. Neijstrom. Patient is a 72-year-old gravida 3 para 3 who has been experiencing abdominal pain since November of 2013. Since December she has lost approximately 20 pounds and during that time is also noted early satiety and not be able to wear her close. She did not seek medical attention until February of this year. She describes her pain is staining pain it has increased since it started in November. She underwent a CT scan of the abdomen and pelvis on March 10. It reveals a moderate amount of ascites or peritoneal cavity. There are multiple soft tissue lesions noted throughout the peritoneal cavity consistent with peritoneal implants of tumor with omental caking as well. The abdominal structures are unremarkable. The pelvis she has ascites as well as multiple peritoneal implants of tumor. The prominent adnexal soft tissue masses bilaterally. On the left a soft tissue mass in the left adnexa measures 7 x 4 cm with a right adnexal soft tissue mass measuring 3.8 x 2.4 cm. She underwent a paracentesis on March 11. Pathology revealed malignant cells consistent with carcinoma. The malignant cells are positive for cytokeratin 7 and show a morphologic and immunophenotypic finding consistent with a gynecologic primary. CA 125 has been drawn that was over 1000.  The patient has a personal history of breast cancer. In 1993 she was diagnosed with a right-sided breast cancer. She was treated with mastectomy on the right and chemotherapy including doxorubicin. She does not require any radiation treatment. She reports she has limited followup. In 2003 show left-sided breast cancer that was early stage. She was treated with a left mastectomy and did not receive any adjuvant chemotherapy or radiation.  She never had any  genetic testing.   Review of Systems: Her review of systems is somewhat as above. She's not able to button her pants his December 2013 as early satiety with no nausea or vomiting. She's had a 20 pound unintentional weight loss. She has chills she has not checked her temperature but is not feels that she has had any fevers. She has a long-standing history of constipation for which she uses milk of magnesia with results. She does have some soreness of breath with activity that is also been new since these diagnoses. There is no associated chest pain or diaphoresis. She can walk around okay has increased work of breathing. She stay she's not had the occasion to go up a flight of stairs he doesn't know if she could. Her by mouth intake is decreased but there's been no nausea. She does complain of thirst and a dry mouth. She had a rash on her tongue consistent with thrush and took some of her family members medications and that has improved. Review of systems 410 systems is otherwise negative.  Current Meds:  Outpatient Encounter Prescriptions as of 05/24/2012  Medication Sig Dispense Refill  . acetaminophen (TYLENOL) 500 MG tablet Take 1 tablet (500 mg total) by mouth every 6 (six) hours as needed for pain.      . amLODipine (NORVASC) 10 MG tablet Take 10 mg by mouth daily.      . Cyanocobalamin (VITAMIN B 12 PO) Take 1 tablet by mouth daily.      . HYDROcodone-acetaminophen (NORCO/VICODIN) 5-325 MG per tablet Take 1 tablet by   mouth every 6 (six) hours as needed for pain.  30 tablet  1  . magnesium hydroxide (MILK OF MAGNESIA) 400 MG/5ML suspension Take 30 mLs by mouth daily as needed for constipation.      . Multiple Vitamins-Minerals (CENTRUM SILVER ADULT 50+ PO) Take 1 tablet by mouth daily.      . pravastatin (PRAVACHOL) 20 MG tablet Take 20 mg by mouth at bedtime.      . senna-docusate (SENOKOT-S) 8.6-50 MG per tablet Take 1 tablet by mouth 2 (two) times daily.       No facility-administered  encounter medications on file as of 05/24/2012.    Allergy:  Allergies  Allergen Reactions  . Penicillins Anaphylaxis and Nausea And Vomiting  . Morphine And Related Nausea And Vomiting    Social Hx:   History   Social History  . Marital Status: Widowed    Spouse Name: N/A    Number of Children: N/A  . Years of Education: N/A   Occupational History  . Not on file.   Social History Main Topics  . Smoking status: Never Smoker   . Smokeless tobacco: Not on file  . Alcohol Use: No  . Drug Use: No  . Sexually Active: No   Other Topics Concern  . Not on file   Social History Narrative  . No narrative on file    Past Surgical Hx: Breast cancer surgery in 1993 and 2003. Bladder tacking in 1982 Past Surgical History  Procedure Laterality Date  . Abdominal hysterectomy    . Mastectomy    . Vascular surgery    . Thyroid lobectomy    . Foot surgery      2 spurs taken out of foot    Past Medical Hx:  Past Medical History  Diagnosis Date  . Hypertension   . Hyperlipemia   . CHF (congestive heart failure)   . Thyroid disease   . Asthma   . Cancer     [place]  . Breast cancer     s/p bilateral mastectomy 1993,2003  . Ovarian cancer 05/17/2012  . 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 Adnexal mass 05/16/2012    Family Hx: She has a niece with breast cancer diagnosed at the age of 72. She never underwent any genetic testing that she is aware of. Family History  Problem Relation Age of Onset  . Cancer Mother     throat cancer  . Hypertension Father   . Heart attack Brother     5 brothers  . Hypertension Sister     Vitals:  Blood pressure 128/68, pulse 80, temperature 98.5 F (36.9 C), resp. rate 16, height 5' 3" (1.6 m), weight 196 lb 6.4 oz (89.086 kg).  Physical Exam: Well-nourished well-developed female in no acute distress.  Neck: Supple, no lymphadenopathy, no thyromegaly.  Cardiovascular regular rate and rhythm.  Lungs: Clear to auscultation  bilaterally.  Abdomen obese soft positive fluid wave. There is a sense of an abdominal pelvic mass in the left lower quadrant. There is tenderness in the left lower quadrant. There is no rebound or guarding.  Groins: No lymphadenopathy.  Extremities: A 2+ nonpitting edema. Varicose veins.  Pelvic: Normal external female genitalia. Bimanual examination reveals an 8 cm pelvic mass adherent to the vaginal cuff. It is smooth. Just tender. It is nonmobile. On rectovaginal exam there is no nodularity.  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 In the   setting of her having breast cancer twice, I feel that this may be related to a BRCA mutation. The patient will require postoperative genetic testing as she has daughters.  I met with the patient and her daughter and her stepdaughter today. She'll need to undergo primary debulking surgery. This will include exploratory laparotomy BSO and attempt at optimal cytoreduction. Risks of surgery include but are not limited to bleeding infection injury to surrounding organs colostomy and venous thromboembolic disease. Understand that she'll need additional treatment in the form of chemotherapy postoperatively.  They had several questions regarding whether or not performing surgery would cause spread of cancer. I discussed with them that her cancer has already spread. It appears that it is stage III and that operating on cancer does not make the cancer spread. They had several questions regarding ovarian cancer and statistics. I discussed with them that the majority of our patients present with stage 3-4 ovarian cancer. And despite this, we were able to get approximately 80% of our patients into remission. I cannot predict for them at this time that if she gets into remission how long that remission may last as the majority of our patients will ultimately  suffer a recurrence. The patient states she had phlebitis and needed vein stripping for that she also had phlebitis during her pregnancy. Does not sound like she had any deep venous thromboses. However, she will receive prophylactic Lovenox perioperatively as well as lives in the postoperative setting per protocol. We discussed the risk of colostomy. She is amenable to her colostomy if it results in optimal cytoreduction.  Their questions were elicited in answer to their satisfaction.  Gurleen Larrivee A., MD 05/24/2012, 9:14 AM  

## 2012-06-05 NOTE — Interval H&P Note (Signed)
History and Physical Interval Note:  06/05/2012 7:14 AM  Rita Humphrey  has presented today for surgery, with the diagnosis of OVARIAN CANCER  The various methods of treatment have been discussed with the patient and family. After consideration of risks, benefits and other options for treatment, the patient has consented to  Procedure(s): EXPLORATORY LAPAROTOMY BILATERAL SALPINGO-OOPHORECTOMY OOMENTECTOMY TUMOR DEBULKING  (Bilateral) as a surgical intervention .  The patient's history has been reviewed, patient examined, no change in status, stable for surgery.  I have reviewed the patient's chart and labs.  Questions were answered to the patient's satisfaction.     Rita Humphrey A.

## 2012-06-05 NOTE — Op Note (Signed)
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   Surgery: Exploratory laparotomy, peritoneal biospy  Surgeons:  Rejeana Brock A. Duard Brady, MD; Antionette Char, MD   Assistant: Telford Nab   Anesthesia: General   Estimated blood loss: 25 ml   IVF: 2000 ml   Urine output: 75 ml   Ascites: 3700 ml  Complications: None   Pathology: Peritoneal biopsies  Operative findings: 3.7 L of ascites. Diffuse carcinomatosis involving the small bowel mesentery with agglutination of the small bowel mesentery. Complete coverage of the right hemidiaphragm with tumor. The transverse colon with omentum was diffusely replaced with tumor that was not resectable. The left upper quadrant agglutination of tumor with bowel that has the appearance of a mass although there's no large abdominal pelvic mass in the left upper quadrant. Diffuse subcentimeter disease coding almost the entire surface of the small bowel mesentery. The right ovary was agglutinated and attached to the rectosigmoid colon with a thick rind of tumor connecting them. The patient did not have resectable disease.  Procedure: The patient was identified in the preoperative holding area. Informed consent was signed on the chart. Patient was seen history was reviewed and exam was performed.   The patient was then taken to the operating room and placed in the supine position with SCD hose on. General anesthesia was then induced without difficulty. She was then placed in the dorsolithotomy position. The perineum and vagina were prepped in the usual fashion. A Foley catheter was inserted into the bladder under sterile conditions. The abdomen was prepped with chlor prep sponge. After waiting 3 minutes for the prep to dry the patient was then draped. Timeout was performed to confirm the patient, procedure, antibiotic, and allergy status. A vertical midline  incision was made with a knife and carried down to the underlying fascia with Bovie cautery. The fascia was scored in the fascial incision was extended superiorly and inferiorly. The rectus bellies were dissected off the lateral edge of the fashion. The peritoneum was entered. 3.7 L of ascites was removed from the abdomen. The patient was then placed in Trendelenburg position. The Buchwalter self-retaining retractor was placed on the patient. This point all points during the case was no significant pressure of the lateral blades on the psoas bellies.  The findings as above were noted. It was deemed at this point the patient disease was not resectable. Peritoneal biopsies were performed in the pelvis to confirm the diagnosis.  The Buchwalter was removed. All laparotomy sponges were removed. The fascia was closed using running #1 PDS. Subcutaneous tissues were irrigated and made hemostatic. Exparel was used for postoperative pain control. The skin was closed using staples.  All instrument,  Needle, laparotomy sponge and Ray-Tec counts were correct x2. The patient tolerated the procedure well and was taken to the recovery room in stable condition. This is Rita Humphrey dictating an operative note on patient Rita Humphrey.

## 2012-06-05 NOTE — Preoperative (Signed)
Beta Blockers   Reason not to administer Beta Blockers:Not Applicable 

## 2012-06-05 NOTE — Anesthesia Postprocedure Evaluation (Signed)
Anesthesia Post Note  Patient: Rita Humphrey  Procedure(s) Performed: Procedure(s) (LRB): EXPLORATORY LAPAROTOMY WITH PELVIC TUMOR BIOPSY (Bilateral)  Anesthesia type: General  Patient location: PACU  Post pain: Pain level controlled  Post assessment: Post-op Vital signs reviewed  Last Vitals: BP 106/69  Pulse 101  Temp(Src) 36.6 C (Oral)  Resp 12  SpO2 95%  Post vital signs: Reviewed  Level of consciousness: sedated  Complications: No apparent anesthesia complications

## 2012-06-05 NOTE — Progress Notes (Signed)
Utilization review completed.  

## 2012-06-06 ENCOUNTER — Encounter (HOSPITAL_COMMUNITY): Payer: Self-pay | Admitting: Gynecologic Oncology

## 2012-06-06 DIAGNOSIS — C569 Malignant neoplasm of unspecified ovary: Principal | ICD-10-CM

## 2012-06-06 DIAGNOSIS — Z5111 Encounter for antineoplastic chemotherapy: Secondary | ICD-10-CM

## 2012-06-06 LAB — CBC
MCH: 26.1 pg (ref 26.0–34.0)
MCHC: 32.3 g/dL (ref 30.0–36.0)
Platelets: 580 10*3/uL — ABNORMAL HIGH (ref 150–400)
RDW: 14.2 % (ref 11.5–15.5)

## 2012-06-06 LAB — BASIC METABOLIC PANEL
BUN: 5 mg/dL — ABNORMAL LOW (ref 6–23)
Calcium: 8.5 mg/dL (ref 8.4–10.5)
GFR calc non Af Amer: 89 mL/min — ABNORMAL LOW (ref >90)
Glucose, Bld: 170 mg/dL — ABNORMAL HIGH (ref 70–99)
Sodium: 133 mEq/L — ABNORMAL LOW (ref 135–145)

## 2012-06-06 MED ORDER — DEXAMETHASONE SODIUM PHOSPHATE 4 MG/ML IJ SOLN
8.0000 mg | Freq: Once | INTRAMUSCULAR | Status: AC
  Administered 2012-06-06: 8 mg via INTRAVENOUS
  Filled 2012-06-06 (×2): qty 2

## 2012-06-06 MED ORDER — ALUM & MAG HYDROXIDE-SIMETH 200-200-20 MG/5ML PO SUSP
30.0000 mL | Freq: Four times a day (QID) | ORAL | Status: DC | PRN
  Administered 2012-06-06: 30 mL via ORAL
  Filled 2012-06-06 (×3): qty 30

## 2012-06-06 MED ORDER — DIPHENHYDRAMINE HCL 50 MG/ML IJ SOLN
12.5000 mg | Freq: Once | INTRAMUSCULAR | Status: AC
  Administered 2012-06-06: 12.5 mg via INTRAVENOUS
  Filled 2012-06-06: qty 1

## 2012-06-06 NOTE — Progress Notes (Signed)
1 Day Post-Op Procedure(s) (LRB): EXPLORATORY LAPAROTOMY WITH PELVIC TUMOR BIOPSY (Bilateral)  Subjective: Patient reports mild soreness.  Tolerating diet with no nausea or emesis.  Adequate pain relief reported.  Denies passing flatus or having a bowel movement.  Objective: Vital signs in last 24 hours: Temp:  [97.6 F (36.4 C)-98.3 F (36.8 C)] 97.9 F (36.6 C) (04/02 0545) Pulse Rate:  [87-100] 89 (04/02 0545) Resp:  [8-22] 20 (04/02 0757) BP: (100-141)/(63-75) 119/74 mmHg (04/02 0545) SpO2:  [90 %-98 %] 90 % (04/02 0757) FiO2 (%):  [31 %-43 %] 31 % (04/02 0334) Weight:  [196 lb (88.905 kg)] 196 lb (88.905 kg) (04/01 1139) Last BM Date: 06/04/12  Intake/Output from previous day: 04/01 0701 - 04/02 0700 In: 6981.3 [P.O.:2400; I.V.:4381.3; IV Piggyback:200] Out: 7000 [Urine:3425; Blood:25]  Physical Examination: General: alert, cooperative and no distress Resp: clear to auscultation bilaterally Cardio: regular rate and rhythm, S1, S2 normal, no murmur, click, rub or gallop GI: soft, non-tender; bowel sounds normal; no masses,  no organomegaly and incision: midline incision with staples, dressing removed, incision clean, dry, and intact Extremities: extremities normal, atraumatic, no cyanosis or edema  Labs: WBC/Hgb/Hct/Plts:  18.4/12.2/37.8/580 (04/02 0415) BUN/Cr/glu/ALT/AST/amyl/lip:  5/0.59/--/--/--/--/-- (04/02 0415)  Assessment: 72 y.o. s/p Procedure(s): EXPLORATORY LAPAROTOMY WITH PELVIC TUMOR BIOPSY: stable Pain:  Pain is well-controlled on PCA.  Heme:  Stable post-operatively.  CV:  BP and HR stable post-operatively.  Norvasc ordered.  GI:  Tolerating po: Yes.     FEN:  Stable post-operatively.  Prophylaxis: pharmacologic prophylaxis (with any of the following: enoxaparin (Lovenox) 40mg  SQ 2 hours prior to surgery then every day) and intermittent pneumatic compression boots.  Plan: Discontinue PCA Saline lock IV Encourage deep breathing, coughing, and IS  use Encourage ambulation with assist Continue post-operative plan of care   LOS: 1 day    CROSS, [name] 06/06/2012, 9:39 AM

## 2012-06-06 NOTE — Progress Notes (Signed)
Progress Note:  Subjective: Medical oncology followup visit for this 72 year old woman with previous history of metachronous primary bilateral breast cancers in 1993 on the right side, and in 2003 on the left side. She recently presented with a 5 month history of progressive abdominal pain, anorexia, weight loss and CT scan of the abdomen done March 10 showed multiple peritoneal soft tissue masses,  omental caking, and ascites. Bilateral adnexal masses. Pathology on a paracentesis specimen taken March 11 was diagnostic for malignancy. Cells stained positive for cytokeratin 7. CA 125 tumor marker 1001 units. She was referred for debulking surgery. She was taken to the operating room on April 1. 3.7 L of ascites was drained. Findings included diffuse carcinomatosis involving the small bowel mesentery, right hemidiaphragm, omentum, additional tumor in the left upper quadrant. Diffuse subcentimeter disease coating the entire surface of the small bowel mesentery. The right ovary was adhesed to the rectosigmoid colon. She was felt to be surgically unresectable at this time.  Her primary oncologist recommended initiating the first cycle of palliative chemotherapy while the patient is in the hospital.  She is recuperating nicely from the surgery but still has significant incisional pain. She has not had a bowel movement or passed gas but she was started on some solid food today which she has kept down. She has active bowel sounds on exam. Wound is healing nicely. She has normal renal and hematologic function.  She has history of treated hypertension but has never had an MI. She does not have diabetes. She denies any paresthesias.     Vitals: Filed Vitals:   06/06/12 1000  BP: 137/61  Pulse: 92  Temp: 99 F (37.2 C)  Resp: 18   Wt Readings from Last 3 Encounters:  06/05/12 196 lb (88.905 kg)  06/05/12 196 lb (88.905 kg)  06/01/12 198 lb 6.4 oz (89.994 kg)     PHYSICAL EXAM:  General NAD Head:  Normal Eyes: Normal Throat: No erythema or exudate Neck: Lymph Nodes: No adenopathy Lungs: Clear to auscultation Breasts: Bilateral mastectomies  Cardiac: Regular rhythm no murmur Abdominal: Soft, obese, midline surgical incision from umbilicus to pubis. Surgical staples in place. Diffusely tender. Hyperactive bowel sounds. Extremities: No edema, no calf tenderness Vascular: No cyanosis  Neurologic: She is alert and oriented. Motor strength 5 over 5. Reflexes absent symmetric at the knees, 1+ symmetric at the biceps Skin: No rash or ecchymosis  Labs:   Recent Labs  06/06/12 0415  WBC 18.4*  HGB 12.2  HCT 37.8  PLT 580*    Recent Labs  06/06/12 0415  NA 133*  K 4.5  CL 100  CO2 25  GLUCOSE 170*  BUN 5*  CREATININE 0.59  CALCIUM 8.5      Images Studies/Results:   No results found.   Patient Active Problem List  Diagnosis  . Hypokalemia  . Abdominal pain  . HTN (hypertension)  . Hyperlipemia  . Low back pain  . Neck pain  . Ascites  . Adnexal mass  . Unspecified constipation  . Thrombocytosis  . Ovarian cancer  . Omental metastasis    Assessment and Plan:  #1. Stage IIIC ovarian cancer-extensive intra-abdominal disease, surgically unresectable at this time. Plan: I think she is stable to proceed with an  initial chemotherapy treatment before hospital discharge. Her daughter was present. I reviewed the treatment plan and potential side effects of the chemotherapy drugs in detail including but not limited to potential for allergic reactions, neuropathy, and suppression of normal blood  counts . I will begin treatment with carboplatinum AUC 5 using a creatinine clearance of 100 mL per minute instead of the calculated clearance of 121 ml a minute in view of her age,  plus Taxotere 60 mg per meter squared,  to begin in the morning on April 3.  #2. Metachronous primary breast cancers and now ovarian cancer. I agree with other examiners, genetic testing is  indicated for the BRCA1 and BRCA2 genes. I discussed this with the patient's daughter as well today. This testing can be done as an outpatient when she is otherwise stable.    Kaydee Magel M 06/06/2012, 2:15 PM

## 2012-06-06 NOTE — Progress Notes (Signed)
Nutrition Brief Note  Patient identified on the Malnutrition Screening Tool (MST) Report  Body mass index is 34.73 kg/(m^2). Patient meets criteria for class I obesity based on current BMI.   Current diet order is regular, patient is consuming approximately 80% of meals at this time. Labs and medications reviewed. Sodium slightly low. Pt admitted for exploratory laparotomy with bilateral salping-oophorectomy with tumor debulking for ovarian CA, currently POD# 1. Met with pt who reports eating well with good appetite PTA, 3 meals/day, denies any weight loss. Pt currently eating well, only c/o heartburn which RN is going to give her Mylanta for.   No nutrition interventions warranted at this time. If nutrition issues arise, please consult RD.   Levon Hedger MS, RD, LDN [phone] Pager [phone] After Hours Pager

## 2012-06-06 NOTE — Care Management Note (Signed)
    Page 1 of 1   06/06/2012     10:24:41 AM   CARE MANAGEMENT NOTE 06/06/2012  Patient:  Rita Humphrey, Rita Humphrey   Account Number:  [id number]  Date Initiated:  06/06/2012  Documentation initiated by:  Lorenda Ishihara  Subjective/Objective Assessment:   72 yo female admitted s/p exploratory lap with peritioneal bx related to ovarian cancer. PTA lived at home alone.     Action/Plan:   Home when stable   Anticipated DC Date:  06/09/2012   Anticipated DC Plan:  HOME/SELF CARE      DC Planning Services  CM consult      Choice offered to / List presented to:             Status of service:  Completed, signed off Medicare Important Message given?   (If response is "NO", the following Medicare IM given date fields will be blank) Date Medicare IM given:   Date Additional Medicare IM given:    Discharge Disposition:  HOME/SELF CARE  Per UR Regulation:  Reviewed for med. necessity/level of care/duration of stay  If discussed at Long Length of Stay Meetings, dates discussed:    Comments:

## 2012-06-07 LAB — BASIC METABOLIC PANEL
Chloride: 98 mEq/L (ref 96–112)
GFR calc Af Amer: >90 mL/min (ref >90)
Potassium: 4.3 mEq/L (ref 3.5–5.1)

## 2012-06-07 LAB — CBC
Platelets: 616 10*3/uL — ABNORMAL HIGH (ref 150–400)
RDW: 14.4 % (ref 11.5–15.5)
WBC: 14.4 10*3/uL — ABNORMAL HIGH (ref 4.0–10.5)

## 2012-06-07 LAB — URINALYSIS, ROUTINE W REFLEX MICROSCOPIC
Ketones, ur: NEGATIVE mg/dL
Leukocytes, UA: NEGATIVE
Nitrite: NEGATIVE
Specific Gravity, Urine: 1.022 (ref 1.005–1.030)
Urobilinogen, UA: 0.2 mg/dL (ref 0.0–1.0)
pH: 7 (ref 5.0–8.0)

## 2012-06-07 MED ORDER — FAMOTIDINE IN NACL 20-0.9 MG/50ML-% IV SOLN: 20.0000 mg | Freq: Once | INTRAVENOUS | Status: AC | PRN

## 2012-06-07 MED ORDER — DIPHENHYDRAMINE HCL 50 MG/ML IJ SOLN: 25.0000 mg | Freq: Once | INTRAMUSCULAR | Status: AC | PRN

## 2012-06-07 MED ORDER — ALBUTEROL SULFATE (5 MG/ML) 0.5% IN NEBU: 2.5000 mg | INHALATION_SOLUTION | Freq: Once | RESPIRATORY_TRACT | Status: AC | PRN

## 2012-06-07 MED ORDER — ALTEPLASE 2 MG IJ SOLR: 2.0000 mg | Freq: Once | INTRAMUSCULAR | Status: AC | PRN

## 2012-06-07 MED ORDER — SODIUM CHLORIDE 0.9 % IV SOLN: Freq: Once | INTRAVENOUS | Status: AC | PRN

## 2012-06-07 MED ORDER — DIPHENHYDRAMINE HCL 50 MG/ML IJ SOLN: 50.0000 mg | Freq: Once | INTRAMUSCULAR | Status: AC | PRN

## 2012-06-07 MED ORDER — DOCETAXEL CHEMO INJECTION 160 MG/16ML
60.0000 mg/m2 | Freq: Once | INTRAVENOUS | Status: AC
  Administered 2012-06-07: 120 mg via INTRAVENOUS
  Filled 2012-06-07: qty 12

## 2012-06-07 MED ORDER — EPINEPHRINE HCL 1 MG/ML IJ SOLN: 0.5000 mg | Freq: Once | INTRAMUSCULAR | Status: AC | PRN

## 2012-06-07 MED ORDER — SODIUM CHLORIDE 0.9 % IV SOLN
INTRAVENOUS | Status: DC
  Administered 2012-06-07: 03:00:00 via INTRAVENOUS

## 2012-06-07 MED ORDER — FAMOTIDINE 10 MG PO TABS
10.0000 mg | ORAL_TABLET | Freq: Every day | ORAL | Status: DC
  Administered 2012-06-07 – 2012-06-08 (×2): 10 mg via ORAL
  Filled 2012-06-07 (×3): qty 1

## 2012-06-07 MED ORDER — EPINEPHRINE HCL 0.1 MG/ML IJ SOLN: 0.2500 mg | Freq: Once | INTRAMUSCULAR | Status: AC | PRN

## 2012-06-07 MED ORDER — SODIUM CHLORIDE 0.9 % IJ SOLN: 3.0000 mL | INTRAMUSCULAR | Status: DC | PRN

## 2012-06-07 MED ORDER — HEPARIN SOD (PORK) LOCK FLUSH 100 UNIT/ML IV SOLN: 250.0000 [IU] | Freq: Once | INTRAVENOUS | Status: AC | PRN

## 2012-06-07 MED ORDER — SODIUM CHLORIDE 0.9 % IV SOLN
Freq: Once | INTRAVENOUS | Status: AC
  Administered 2012-06-07: 16 mg via INTRAVENOUS
  Filled 2012-06-07: qty 8

## 2012-06-07 MED ORDER — COLD PACK MISC ONCOLOGY
1.0000 | Freq: Once | Status: AC | PRN
Start: 2012-06-07 — End: 2012-06-07
  Filled 2012-06-07: qty 1

## 2012-06-07 MED ORDER — METHYLPREDNISOLONE SODIUM SUCC 125 MG IJ SOLR: 125.0000 mg | Freq: Once | INTRAMUSCULAR | Status: AC | PRN

## 2012-06-07 MED ORDER — SODIUM CHLORIDE 0.9 % IV SOLN: Freq: Once | INTRAVENOUS | Status: DC

## 2012-06-07 MED ORDER — ALBUMIN HUMAN 5 % IV SOLN
12.5000 g | Freq: Once | INTRAVENOUS | Status: AC
  Administered 2012-06-07: 12.5 g via INTRAVENOUS
  Filled 2012-06-07: qty 250

## 2012-06-07 MED ORDER — SODIUM CHLORIDE 0.9 % IV SOLN
600.0000 mg | Freq: Once | INTRAVENOUS | Status: AC
  Administered 2012-06-07: 600 mg via INTRAVENOUS
  Filled 2012-06-07: qty 60

## 2012-06-07 MED ORDER — HEPARIN SOD (PORK) LOCK FLUSH 100 UNIT/ML IV SOLN: 500.0000 [IU] | Freq: Once | INTRAVENOUS | Status: AC | PRN

## 2012-06-07 MED ORDER — SODIUM CHLORIDE 0.9 % IJ SOLN: 10.0000 mL | INTRAMUSCULAR | Status: DC | PRN

## 2012-06-07 NOTE — Progress Notes (Signed)
Patient and family educated on patients chemotherapy, hand outs given. Family instructed an praecutions handling body fluids and soiled linens or clothing.

## 2012-06-07 NOTE — Progress Notes (Signed)
2 Days Post-Op Procedure(s) (LRB): EXPLORATORY LAPAROTOMY WITH PELVIC TUMOR BIOPSY (Bilateral)  Subjective: Patient reports moderate pain yesterday evening with little relief after percocet administration.  Pain stable at this time.  Tolerating diet with no nausea or emesis.  Denies passing flatus or having a bowel movement.  Objective: Vital signs in last 24 hours: Temp:  [98 F (36.7 C)-99.2 F (37.3 C)] 98.9 F (37.2 C) (04/03 0505) Pulse Rate:  [92-96] 96 (04/03 0505) Resp:  [18-22] 20 (04/03 0505) BP: (127-143)/(61-79) 134/75 mmHg (04/03 0505) SpO2:  [94 %-97 %] 96 % (04/03 0505) Last BM Date: 06/04/12  Intake/Output from previous day: 04/02 0701 - 04/03 0700 In: 1596.7 [P.O.:360; I.V.:986.7; IV Piggyback:250] Out: 775 [Urine:775]  Physical Examination: General: alert, cooperative and no distress Resp: clear to auscultation bilaterally Cardio: regular rate and rhythm, S1, S2 normal, no murmur, click, rub or gallop GI: soft, non-tender; bowel sounds normal; no masses,  no organomegaly and incision: midline incision with staples, incision clean, dry, and intact Extremities: extremities normal, atraumatic, no cyanosis or edema  Labs: WBC/Hgb/Hct/Plts:  14.4/13.1/40.9/616 (04/03 0429) BUN/Cr/glu/ALT/AST/amyl/lip:  9/0.61/--/--/--/--/-- (04/03 0429)  Assessment: 72 y.o. s/p Procedure(s): EXPLORATORY LAPAROTOMY WITH PELVIC TUMOR BIOPSY: stable Pain:  Pain is well-controlled on PO Percocet.  Heme:  Stable post-operatively.  CV:  BP and HR stable post-operatively.  Norvasc ordered.  GI:  Tolerating po: Yes.     FEN:  Stable post-operatively.  Prophylaxis: pharmacologic prophylaxis (with any of the following: enoxaparin (Lovenox) 40mg  SQ 2 hours prior to surgery then every day) and intermittent pneumatic compression boots.  Plan: Plan for chemo administration today per Dr. Cyndie Chime Encourage deep breathing, coughing, and IS use Encourage ambulation with  assist Continue post-operative plan of care   LOS: 2 days    CROSS, [name] 06/07/2012, 8:59 AM

## 2012-06-07 NOTE — Progress Notes (Signed)
Stable overnight.  Pre-meds decadron/benadryl given. Still no gas or BM per rectum but abdomen soft, active bowel sounds, tolerating PO solids. Impression: Newly dxd ovarian cancer - extensive intra-abdominal disease. Surgically unresectable.  High risk for bowel obstruction. Balancing risks/benefit of early chemo in favor of proceeding with treatment today. Both daughters present.  Plan reviewed. Patient transferred to Oncology floor to receive first Rx. I will schedule a dose of Neulasta in [place] office for Monday April 7. Discussed status by phone with her primary Oncologist, Dr Mariel Sleet.

## 2012-06-07 NOTE — Progress Notes (Signed)
Dr.Jackson Moore notified that patient has had decreased urinary output for last 8 hours.  From 10pm -2am patient only put out 75cc urine.  Patient emptying bladder.  Encouraged patient to continue to drink water.  Daughters at bedside

## 2012-06-08 MED ORDER — ENOXAPARIN SODIUM 40 MG/0.4ML ~~LOC~~ SOLN: 40.0000 mg | Freq: Every day | SUBCUTANEOUS | Status: DC

## 2012-06-08 MED ORDER — OXYCODONE-ACETAMINOPHEN 5-325 MG PO TABS: 1.0000 | ORAL_TABLET | ORAL | Status: DC | PRN

## 2012-06-08 MED ORDER — ENOXAPARIN (LOVENOX) PATIENT EDUCATION KIT: PACK | Freq: Once | Status: DC

## 2012-06-08 NOTE — Discharge Summary (Signed)
Physician Discharge Summary  Patient ID: Rita Humphrey MRN: [id number] DOB/AGE: 72-Jul-1942 72 y.o.  Admit date: 06/05/2012 Discharge date: 06/08/2012  Admission Diagnoses: Ovarian cancer  Discharge Diagnoses:  Principal Problem:   Ovarian cancer  Discharged Condition:  The patient is in good condition and stable for discharge.  Hospital Course: On 06/05/2012, the patient underwent the following: Procedure(s):  EXPLORATORY LAPAROTOMY WITH PELVIC TUMOR BIOPSY.  The postoperative course was uneventful.  She received her first chemotherapy treatment inpatient on 06/07/12 and tolerated it well.  She was discharged to home on postoperative day 3 tolerating a regular diet.  Consults:  Medical Oncology    Significant Diagnostic Studies:  None  Treatments: IV hydration, surgery: see above and chemotherapy treatment  Discharge Exam: Blood pressure 121/72, pulse 95, temperature 97.9 F (36.6 C), temperature source Oral, resp. rate 18, height 5\' 3"  (1.6 m), weight 195 lb 8.8 oz (88.7 kg), SpO2 95.00%. General appearance: alert, cooperative and no distress Resp: clear to auscultation bilaterally Cardio: regular rate and rhythm, S1, S2 normal, no murmur, click, rub or gallop GI: soft, non-tender; bowel sounds normal; no masses,  no organomegaly Extremities: extremities normal, atraumatic, no cyanosis or edema Incision/Wound: Midline incision with staples clean, dry, and intact  Disposition: 01-Home or Self Care      Discharge Orders   Future Appointments Provider Department Dept Phone   06/11/2012 11:00 AM [facility] [phone]   07/12/2012 2:15 PM [name], MD [facility] G[facility] [phone]   Future Orders Complete By Expires     Call MD for:  difficulty breathing, headache or visual disturbances  As directed     Call MD for:  extreme fatigue  As directed     Call MD for:  hives  As directed     Call MD for:  persistant  dizziness or light-headedness  As directed     Call MD for:  persistant nausea and vomiting  As directed     Call MD for:  redness, tenderness, or signs of infection (pain, swelling, redness, odor or green/yellow discharge around incision site)  As directed     Call MD for:  severe uncontrolled pain  As directed     Call MD for:  temperature >100.4  As directed     Diet - low sodium heart healthy  As directed     Driving Restrictions  As directed     Comments:      No driving for 2 weeks.  Do not take narcotics and drive.    Increase activity slowly  As directed     Lifting restrictions  As directed     Comments:      No lifting greater than 10 lbs.    Sexual Activity Restrictions  As directed     Comments:      No sexual activity, nothing in the vagina, for 6 weeks.        Medication List    STOP taking these medications       HYDROcodone-acetaminophen 5-325 MG per tablet  Commonly known as:  NORCO/VICODIN      TAKE these medications       amLODipine 10 MG tablet  Commonly known as:  NORVASC  Take 10 mg by mouth daily before breakfast.     dextromethorphan 30 MG/5ML liquid  Commonly known as:  DELSYM  Take 60 mg by mouth as needed for cough. 2 teaspoons every 4 hours as  needed     diphenhydrAMINE 25 MG tablet  Commonly known as:  BENADRYL  Take 25 mg by mouth every 6 (six) hours as needed for itching.     enoxaparin 40 MG/0.4ML injection  Commonly known as:  LOVENOX  Inject 0.4 mLs (40 mg total) into the skin daily before breakfast.     ibuprofen 200 MG tablet  Commonly known as:  ADVIL,MOTRIN  Take 400 mg by mouth every 6 (six) hours as needed for pain.     magnesium hydroxide 400 MG/5ML suspension  Commonly known as:  MILK OF MAGNESIA  Take 30 mLs by mouth daily as needed for constipation.     multivitamin with minerals Tabs  Take 1 tablet by mouth daily.     oxyCODONE-acetaminophen 5-325 MG per tablet  Commonly known as:  PERCOCET/ROXICET  Take 1-2  tablets by mouth every 4 (four) hours as needed.     pravastatin 20 MG tablet  Commonly known as:  PRAVACHOL  Take 20 mg by mouth at bedtime.     pseudoephedrine 30 MG tablet  Commonly known as:  SUDAFED  Take 30 mg by mouth every 4 (four) hours as needed for congestion. [employer] brand per patient       Follow-up Information   Follow up with [facility] A., MD On 07/12/2012. (at 2:15pm)    Contact information:   [address] [place] [address] [phone]       Signed: CROSS, [name] 06/08/2012, 11:19 AM

## 2012-06-08 NOTE — Progress Notes (Signed)
She did well with first chemo. No acute toxicities. I/O 480/1100 Still no flatus or BM day 3 post op exploratory laparotomy T max 99.2  BP 121/72  O2 sat 95% room air Lungs clear Heart: regular rhythm Abdomen soft; wound healing well; bowel sounds present but decreased; no signs of obstruction Extremities: no edema, no calf tenderness Impression: Locally advanced, surgically unresectable, ovarian cancer. Impression: stable post 1st chemo Rx. Rec:  I will call [facility] [phone]) - she should get a dose of Neulasta SQ on Monday 4/7 then follow up with Dr Mariel Sleet in 2 weeks. Thanks

## 2012-06-08 NOTE — Care Management Note (Signed)
CM spoke with patient with adult daughters present at bedside concerning discharge planning. Per pt adult daughters assist in home care. Pt states having DME for home use. No other needs stated. Per MD note pt to follow up at [facility] on Monday 4/7.   Roxy Manns Asna Muldrow,RN,BSN [phone]

## 2012-06-08 NOTE — Progress Notes (Signed)
Pt's chemo ended at 2030. Pt tolerated chemo well. No reaction noted. Vitals signs are within  Normal limits will continue to monitor closely.

## 2012-06-11 ENCOUNTER — Other Ambulatory Visit: Payer: Self-pay | Admitting: Certified Registered Nurse Anesthetist

## 2012-06-11 ENCOUNTER — Encounter (HOSPITAL_COMMUNITY): Payer: Medicare Other | Attending: Oncology

## 2012-06-11 ENCOUNTER — Other Ambulatory Visit (HOSPITAL_COMMUNITY): Payer: Self-pay | Admitting: Oncology

## 2012-06-11 ENCOUNTER — Encounter (HOSPITAL_BASED_OUTPATIENT_CLINIC_OR_DEPARTMENT_OTHER): Payer: Medicare Other | Admitting: Oncology

## 2012-06-11 VITALS — BP 126/68 | HR 94 | Temp 98.3°F | Resp 20

## 2012-06-11 DIAGNOSIS — Z5189 Encounter for other specified aftercare: Secondary | ICD-10-CM

## 2012-06-11 DIAGNOSIS — B370 Candidal stomatitis: Secondary | ICD-10-CM

## 2012-06-11 DIAGNOSIS — E860 Dehydration: Secondary | ICD-10-CM

## 2012-06-11 DIAGNOSIS — C569 Malignant neoplasm of unspecified ovary: Secondary | ICD-10-CM

## 2012-06-11 DIAGNOSIS — N9489 Other specified conditions associated with female genital organs and menstrual cycle: Secondary | ICD-10-CM

## 2012-06-11 DIAGNOSIS — R112 Nausea with vomiting, unspecified: Secondary | ICD-10-CM

## 2012-06-11 DIAGNOSIS — C786 Secondary malignant neoplasm of retroperitoneum and peritoneum: Secondary | ICD-10-CM

## 2012-06-11 DIAGNOSIS — R110 Nausea: Secondary | ICD-10-CM

## 2012-06-11 LAB — BASIC METABOLIC PANEL
Chloride: 101 mEq/L (ref 96–112)
GFR calc Af Amer: >90 mL/min (ref >90)
Potassium: 3.6 mEq/L (ref 3.5–5.1)

## 2012-06-11 LAB — CBC WITH DIFFERENTIAL/PLATELET
Basophils Absolute: 0 10*3/uL (ref 0.0–0.1)
Basophils Relative: 0 % (ref 0–1)
Hemoglobin: 13.4 g/dL (ref 12.0–15.0)
MCHC: 33 g/dL (ref 30.0–36.0)
Monocytes Relative: 1 % — ABNORMAL LOW (ref 3–12)
Neutro Abs: 6 10*3/uL (ref 1.7–7.7)
Neutrophils Relative %: 80 % — ABNORMAL HIGH (ref 43–77)
RDW: 14.2 % (ref 11.5–15.5)

## 2012-06-11 MED ORDER — ONDANSETRON HCL 8 MG PO TABS: 8.0000 mg | ORAL_TABLET | Freq: Three times a day (TID) | ORAL | Status: DC | PRN

## 2012-06-11 MED ORDER — SODIUM CHLORIDE 0.9 % IV SOLN
Freq: Once | INTRAVENOUS | Status: AC
  Administered 2012-06-11: 16 mg via INTRAVENOUS
  Filled 2012-06-11: qty 8

## 2012-06-11 MED ORDER — SODIUM CHLORIDE 0.9 % IV SOLN: 16.0000 mg | Freq: Once | INTRAVENOUS | Status: DC

## 2012-06-11 MED ORDER — SODIUM CHLORIDE 0.9 % IV SOLN
Freq: Once | INTRAVENOUS | Status: AC
  Administered 2012-06-11: 12:00:00 via INTRAVENOUS

## 2012-06-11 MED ORDER — LORAZEPAM 2 MG/ML IJ SOLN
INTRAMUSCULAR | Status: AC
  Filled 2012-06-11: qty 1

## 2012-06-11 MED ORDER — LORAZEPAM 0.5 MG PO TABS: 0.5000 mg | ORAL_TABLET | ORAL | Status: DC | PRN

## 2012-06-11 MED ORDER — DEXAMETHASONE SODIUM PHOSPHATE 10 MG/ML IJ SOLN: 10.0000 mg | Freq: Once | INTRAMUSCULAR | Status: DC

## 2012-06-11 MED ORDER — MAGIC MOUTHWASH W/LIDOCAINE: 5.0000 mL | Freq: Four times a day (QID) | ORAL | Status: DC | PRN

## 2012-06-11 MED ORDER — PEGFILGRASTIM INJECTION 6 MG/0.6ML
6.0000 mg | Freq: Once | SUBCUTANEOUS | Status: AC
  Administered 2012-06-11: 6 mg via SUBCUTANEOUS

## 2012-06-11 MED ORDER — PEGFILGRASTIM INJECTION 6 MG/0.6ML
SUBCUTANEOUS | Status: AC
  Filled 2012-06-11: qty 0.6

## 2012-06-11 MED ORDER — NYSTATIN 100000 UNIT/GM EX POWD: Freq: Three times a day (TID) | CUTANEOUS | Status: DC

## 2012-06-11 MED ORDER — LORAZEPAM 2 MG/ML IJ SOLN
1.0000 mg | Freq: Once | INTRAMUSCULAR | Status: AC
  Administered 2012-06-11: 1 mg via INTRAVENOUS

## 2012-06-11 NOTE — Progress Notes (Signed)
Advanced stage III cancer the ovary, unresectable now receiving chemotherapy. She had her surgery last Tuesday followed by her first chemotherapy cycle on Thursday and she went home on Friday. Unfortunately she went home without anti-emetics. She has been throwing up intermittently all weekend. She's also had diarrhea. Not been bloody. She's not had fever chills. She is very weak today.  She does have dehydration via a BUN/creatinine ratio. She also has candidiasis of her mouth, tongue, inguinal areas, and perianal areas.  She is weak her decision however looks good. I will not remove her staples just yet. The wound is clean and dry but she has been vomiting pretty heavily at times. She does have ankle edema. She is passing gas of course along the diarrhea.  She looks very weak and tired and she will need a walker, bedside commode, chilly fluids and antiemetics today intravenously. She'll get her Neulasta shot today as well. We may need to see her tomorrow.

## 2012-06-11 NOTE — Patient Instructions (Addendum)
[facility] Discharge Instructions for Patients Receiving Chemotherapy  Thursday 4/3  you received the following chemotherapy agents: carboplatin and taxotere  To help prevent nausea and vomiting after your treatment, we encourage you to take your nausea medication  Ativan 0.5 mg every 3 to 4 hours as needed for nausea or vomiting zofran Take 1 tablet (8 mg total) by mouth every 8 (eight) hours as needed for nausea Dukes magic mouthwash   Take 5 mLs by mouth 4 (four) times daily as needed. - Oral   Nystatin powder  : Apply topically 3 (three) times daily.     Begin taking it at as needed and take it as often as prescribed for the next 48-72  hours.   If you develop nausea and vomiting that is not controlled by your nausea medication, call the clinic. If it is after clinic hours your family physician or the after hours number for the clinic or go to the Emergency Department.   BELOW ARE SYMPTOMS THAT SHOULD BE REPORTED IMMEDIATELY:  *FEVER GREATER THAN 101.0 F  *CHILLS WITH OR WITHOUT FEVER  NAUSEA AND VOMITING THAT IS NOT CONTROLLED WITH YOUR NAUSEA MEDICATION  *UNUSUAL SHORTNESS OF BREATH  *UNUSUAL BRUISING OR BLEEDING  TENDERNESS IN MOUTH AND THROAT WITH OR WITHOUT PRESENCE OF ULCERS  *URINARY PROBLEMS  *BOWEL PROBLEMS  UNUSUAL RASH Items with * indicate a potential emergency and should be followed up as soon as possible.  One of the nurses will contact you 24 hours after your treatment. Please let the nurse know about any problems that you may have experienced. Feel free to call the clinic you have any questions or concerns. The clinic phone number is [phone].   I have been informed and understand all the instructions given to me. I know to contact the clinic, my physician, or go to the Emergency Department if any problems should occur. I do not have any questions at this time, but understand that I may call the clinic during office hours or the  Patient Navigator at [phone] should I have any questions or need assistance in obtaining follow up care.    __________________________________________  _____________  __________ Signature of Patient or Authorized Representative            Date                   Time    __________________________________________ Nurse's Signature

## 2012-06-11 NOTE — Progress Notes (Signed)
Rita Humphrey presents today for injection per MD orders. Patient c/o weakness, nausea, and diarrhea over the weekend. She reports that she does not have any nausea meds at home. Dr. Mariel Sleet in to talk with patient. Staples will not be removed from abdominal incision due to less than one week post op. Neulasta 6mg  administered SQ in right Abdomen. Administration without incident. Patient tolerated well. 1400 patient sleeping 1600 Up to restroom for urination. States that she feels better, denies nausea.

## 2012-06-12 ENCOUNTER — Telehealth (HOSPITAL_COMMUNITY): Payer: Self-pay | Admitting: Oncology

## 2012-06-18 ENCOUNTER — Encounter (HOSPITAL_BASED_OUTPATIENT_CLINIC_OR_DEPARTMENT_OTHER): Payer: Medicare Other | Admitting: Oncology

## 2012-06-18 ENCOUNTER — Other Ambulatory Visit (HOSPITAL_COMMUNITY): Payer: Self-pay | Admitting: Oncology

## 2012-06-18 VITALS — BP 137/81 | HR 94 | Temp 97.9°F | Resp 18 | Wt 187.3 lb

## 2012-06-18 DIAGNOSIS — R5383 Other fatigue: Secondary | ICD-10-CM

## 2012-06-18 DIAGNOSIS — C569 Malignant neoplasm of unspecified ovary: Secondary | ICD-10-CM

## 2012-06-18 DIAGNOSIS — R5381 Other malaise: Secondary | ICD-10-CM

## 2012-06-18 LAB — CBC WITH DIFFERENTIAL/PLATELET
Basophils Absolute: 0.1 10*3/uL (ref 0.0–0.1)
Basophils Relative: 0 % (ref 0–1)
Eosinophils Absolute: 0 10*3/uL (ref 0.0–0.7)
Eosinophils Relative: 0 % (ref 0–5)
HCT: 41.8 % (ref 36.0–46.0)
MCH: 26 pg (ref 26.0–34.0)
MCHC: 31.8 g/dL (ref 30.0–36.0)
MCV: 81.6 fL (ref 78.0–100.0)
Monocytes Absolute: 1.1 10*3/uL — ABNORMAL HIGH (ref 0.1–1.0)
RDW: 15.8 % — ABNORMAL HIGH (ref 11.5–15.5)

## 2012-06-18 LAB — BASIC METABOLIC PANEL
CO2: 26 mEq/L (ref 19–32)
Calcium: 9.3 mg/dL (ref 8.4–10.5)
Creatinine, Ser: 0.58 mg/dL (ref 0.50–1.10)
GFR calc Af Amer: >90 mL/min (ref >90)

## 2012-06-18 NOTE — Patient Instructions (Addendum)
.  [facility] Discharge Instructions  RECOMMENDATIONS MADE BY THE CONSULTANT AND ANY TEST RESULTS WILL BE SENT TO YOUR REFERRING PHYSICIAN.  EXAM FINDINGS BY THE PHYSICIAN TODAY AND SIGNS OR SYMPTOMS TO REPORT TO CLINIC OR PRIMARY PHYSICIAN: You look much better today  MEDICATIONS PRESCRIBED:  recommendations are to continue Lovenox for 6 weeks total.( 2 more weeks)  INSTRUCTIONS GIVEN AND DISCUSSED: I will call you with an appt for a surgeon to put port in.  SPECIAL INSTRUCTIONS/FOLLOW-UP: Appt with PA in 3 weeks.  Thank you for choosing [facility] to provide your oncology and hematology care.  To afford each patient quality time with our providers, please arrive at least 15 minutes before your scheduled appointment time.  With your help, our goal is to use those 15 minutes to complete the necessary work-up to ensure our physicians have the information they need to help with your evaluation and healthcare recommendations.    Effective January 1st, 2014, we ask that you re-schedule your appointment with our physicians should you arrive 10 or more minutes late for your appointment.  We strive to give you quality time with our providers, and arriving late affects you and other patients whose appointments are after yours.    Again, thank you for choosing [facility].  Our hope is that these requests will decrease the amount of time that you wait before being seen by our physicians.       _____________________________________________________________  Should you have questions after your visit to [facility], please contact our office at [phone] between the hours of 8:30 a.m. and 5:00 p.m.  Voicemails left after 4:30 p.m. will not be returned until the following business day.  For prescription refill requests, have your pharmacy contact our office with your prescription refill request.

## 2012-06-18 NOTE — Addendum Note (Signed)
Addended by: Edythe Lynn A on: 06/18/2012 05:51 PM   Modules accepted: Orders

## 2012-06-18 NOTE — Progress Notes (Signed)
Rita Humphrey presented for labwork.Attempt x 2. Labs per MD order drawn via Peripheral Line 25 gauge needle inserted in lt forearm  Good blood return present. Procedure without incident.  Needle removed intact. Patient tolerated procedure well.

## 2012-06-18 NOTE — Progress Notes (Signed)
#  1 stage III cancer the ovary, unresectable now status post one cycle of carboplatinum and paclitaxel chemotherapy. #2 dehydration, much improved #3 candidiasis of her mouth, tongue, inguinal areas, and perianal areas with marked improvement #4 weakness fatigue secondary to #1 and 2  She is doing much better since we hydrated her, give her intravenous antibiotics, and treated her candidiasis. She looks like a different person today. She has mild abdominal discomfort but tends to not take her pain medication less she is very very sore. She is here also have her wound staples removed. We were not inclined to remove them last week when she was still nauseated and having dry heaves at times. Her wound looks well-healed with one small gap approximately 2 mm at the superior portion of the incision. She has mild erythema around the base of the staples. I cannot detect ascites on her abdomen. It is soft minimally tender she states. She has no distinct hepatosplenomegaly but she has fullness in the lower quadrants. Lungs are clear. She has no adenopathy. Heart shows a regular rhythm and rate. Facial symmetry is intact. She has no leg edema.  Again she looks much improved we'll see what her blood counts are. Her staples were removed without incident and Steri-Strips were placed over the wound after Betadine was placed on top of the incision. We will see her next week chemotherapy but we do need a port placed we will range that with a local surgeon tomorrow.  She will have a CAT scan he is after cycle 3 after cycle 6 based upon her next visit with Dr. Duard Brady

## 2012-06-19 ENCOUNTER — Encounter: Payer: Self-pay | Admitting: Gynecologic Oncology

## 2012-06-19 ENCOUNTER — Other Ambulatory Visit (HOSPITAL_COMMUNITY): Payer: Self-pay | Admitting: Oncology

## 2012-06-19 ENCOUNTER — Telehealth (HOSPITAL_COMMUNITY): Payer: Self-pay | Admitting: *Deleted

## 2012-06-19 ENCOUNTER — Ambulatory Visit: Payer: Medicare Other | Attending: Gynecologic Oncology | Admitting: Gynecologic Oncology

## 2012-06-19 ENCOUNTER — Telehealth (HOSPITAL_COMMUNITY): Payer: Self-pay

## 2012-06-19 VITALS — BP 122/62 | HR 96 | Temp 98.5°F | Resp 20 | Ht 63.0 in | Wt 186.0 lb

## 2012-06-19 DIAGNOSIS — Z853 Personal history of malignant neoplasm of breast: Secondary | ICD-10-CM | POA: Insufficient documentation

## 2012-06-19 DIAGNOSIS — C569 Malignant neoplasm of unspecified ovary: Secondary | ICD-10-CM

## 2012-06-19 DIAGNOSIS — C786 Secondary malignant neoplasm of retroperitoneum and peritoneum: Secondary | ICD-10-CM

## 2012-06-19 NOTE — Telephone Encounter (Signed)
Spoke with daughter, Sherald Barge.  Stated that her mom's dressing was dry and has not leaked anymore but her mom was complaining of soreness in her abdomen.  Also, that the left side of her stomach appeared "puffier" than the right side.  They have given her pain medication.  Bowels have not moved but she has not taken anything yet.  Discussed with Dr. Lytle Butte and daughter encouraged to get her mom on stool softeners and to take some Milk of Magnesia to get her bowels moving.  1610 Per Dr. Mariel Sleet he has spoken to Dr. Duard Brady and patient is to go to Dr. Denman George office immediately to see Dr. Duard Brady.  Daughter notified and they will take her immediately.

## 2012-06-19 NOTE — Telephone Encounter (Signed)
Appt Thursday at 1045

## 2012-06-19 NOTE — Progress Notes (Signed)
Consult Note: Gyn-Onc  Rita Humphrey 72 y.o. female  CC:  Chief Complaint  Patient presents with  . Ovarian Cancer    Follow up     HPI:  Patient is a 72 year old gravida 3 para 3 who has been experiencing abdominal pain since November of 2013. Since December she has lost approximately 20 pounds and during that time is also noted early satiety and not be able to wear her close. She did not seek medical attention until February of this year. She describes her pain is staining pain it has increased since it started in November. She underwent a CT scan of the abdomen and pelvis on March 10. It reveals a moderate amount of ascites or peritoneal cavity. There are multiple soft tissue lesions noted throughout the peritoneal cavity consistent with peritoneal implants of tumor with omental caking as well. The abdominal structures are unremarkable. The pelvis she has ascites as well as multiple peritoneal implants of tumor. The prominent adnexal soft tissue masses bilaterally. On the left a soft tissue mass in the left adnexa measures 7 x 4 cm with a right adnexal soft tissue mass measuring 3.8 x 2.4 cm. She underwent a paracentesis on March 11. Pathology revealed malignant cells consistent with carcinoma. The malignant cells are positive for cytokeratin 7 and show a morphologic and immunophenotypic finding consistent with a gynecologic primary. CA 125 has been drawn that was over 1000.  The patient has a personal history of breast cancer. In 1993 she was diagnosed with a right-sided breast cancer. She was treated with mastectomy on the right and chemotherapy including doxorubicin. She does not require any radiation treatment. She reports she has limited followup. In 2003 show left-sided breast cancer that was early stage. She was treated with a left mastectomy and did not receive any adjuvant chemotherapy or radiation.  She never had any genetic testing.  She underwent expiratory laparotomy and peritoneal  biopsies on April 1. Operative findings included 3.7 L of ascites. 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 She complete coverage of the right hemidiaphragm a tumor. The transverse colon the colon and omentum were diffusely replaced with tumor that was not resectable. The left upper quadrant she agglutination tumor with about that had the appearance of the mass although there is no large abdominal pelvic mass in the left upper quadrant. There is diffuse subcentimeter disease coding almost the entire surface of the small bowel mesentery. The right ovary was agglutinated attached to the rectosigmoid colon with a thick rind of tumor connecting it. She was not deemed resectable. A biopsy was performed it revealed high-grade carcinoma. 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 She underwent 1 cycle of Taxotere and carboplatin based chemotherapy in the hospital.   She was seen by Dr. Mariel Sleet yesterday at which time the staples were removed. Getting in a car should a large gush of fluid. He called me today we asked her to come in to the clinic to be seen and evaluated. She states that the fluid has not really been accumulating again within her abdomen. She has not felt any additional fluid from the incision. She denies any fevers or chills. Her weight is down 11 pounds since we saw her March 20.  She and her family agree that she's eating much better.  Review of Systems:  Current Meds:  Outpatient Encounter Prescriptions as of 06/19/2012  Medication Sig Dispense Refill  . Alum & Mag Hydroxide-Simeth (MAGIC  MOUTHWASH W/LIDOCAINE) SOLN Take 5 mLs by mouth 4 (four) times daily as needed.  240 mL  1  . amLODipine (NORVASC) 10 MG tablet Take 10 mg by mouth daily before breakfast.       . dextromethorphan (DELSYM) 30 MG/5ML liquid Take 60 mg by mouth as needed for cough. 2 teaspoons every 4 hours as needed      .  diphenhydrAMINE (BENADRYL) 25 MG tablet Take 25 mg by mouth every 6 (six) hours as needed for itching.      . enoxaparin (LOVENOX) 40 MG/0.4ML injection Inject 0.4 mLs (40 mg total) into the skin daily before breakfast.  25 Syringe  0  . hydrochlorothiazide (HYDRODIURIL) 25 MG tablet       . ibuprofen (ADVIL,MOTRIN) 200 MG tablet Take 400 mg by mouth every 6 (six) hours as needed for pain.      Marland Kitchen LORazepam (ATIVAN) 0.5 MG tablet Take 1 tablet (0.5 mg total) by mouth every 3 (three) hours as needed for anxiety.  30 tablet  3  . magnesium hydroxide (MILK OF MAGNESIA) 400 MG/5ML suspension Take 30 mLs by mouth daily as needed for constipation.      . Multiple Vitamin (MULTIVITAMIN WITH MINERALS) TABS Take 1 tablet by mouth daily.      Marland Kitchen nystatin (MYCOSTATIN) powder Apply topically 3 (three) times daily.  30 g  1  . ondansetron (ZOFRAN) 8 MG tablet Take 1 tablet (8 mg total) by mouth every 8 (eight) hours as needed for nausea.  20 tablet  1  . oxyCODONE-acetaminophen (PERCOCET/ROXICET) 5-325 MG per tablet Take 1-2 tablets by mouth every 4 (four) hours as needed.  60 tablet  0  . pravastatin (PRAVACHOL) 20 MG tablet Take 20 mg by mouth at bedtime.      . pseudoephedrine (SUDAFED) 30 MG tablet Take 30 mg by mouth every 4 (four) hours as needed for congestion. [employer] brand per patient       No facility-administered encounter medications on file as of 06/19/2012.    Allergy:  Allergies  Allergen Reactions  . Penicillins Anaphylaxis and Nausea And Vomiting  . Morphine And Related Nausea And Vomiting    Social Hx:   History   Social History  . Marital Status: Widowed    Spouse Name: N/A    Number of Children: N/A  . Years of Education: N/A   Occupational History  . Not on file.   Social History Main Topics  . Smoking status: Never Smoker   . Smokeless tobacco: Never Used  . Alcohol Use: No  . Drug Use: No  . Sexually Active: No   Other Topics Concern  . Not on file   Social  History Narrative  . No narrative on file    Past Surgical Hx: Breast cancer surgery in 1993 and 2003. Bladder tacking in 1982 Past Surgical History  Procedure Laterality Date  . Abdominal hysterectomy    . Vascular surgery    . Thyroid lobectomy    . Foot surgery      2 spurs taken out of foot  . Varicose vein surgery    . Bladder tack surgery     . Tonsillectomy    . Mastectomy      bilateral   . Laparotomy Bilateral 06/05/2012    Procedure: EXPLORATORY LAPAROTOMY WITH PELVIC TUMOR BIOPSY;  Surgeon: Rejeana Brock A. Duard Brady, MD;  Location: [facility] ORS;  Service: Gynecology;  Laterality: Bilateral;    Past Medical Hx:  Past Medical History  Diagnosis Date  . Hypertension   . Hyperlipemia   . Thyroid disease   . Asthma   . Cancer     [place]  . Breast cancer     s/p bilateral mastectomy 1610,9604  . Ovarian cancer 05/17/2012  . 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 Adnexal mass 05/16/2012  . Shortness of breath     with exertion   . Peripheral vascular disease     hx of DVT   . Kidney stones     x 2  . 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 She never underwent any genetic testing that she is aware of. Family History  Problem Relation Age of Onset  . Cancer Mother     throat cancer  . Hypertension Father   . Heart attack Brother     5 brothers  . Hypertension Sister     Vitals:  Blood pressure 122/62, pulse 96, temperature 98.5 F (36.9 C), temperature source Oral, resp. rate 20, height 5\' 3"  (1.6 m), weight 186 lb (84.369 kg).  Physical Exam: Well-nourished well-developed female in no acute distress.  Incision is well-healed. There is no In the suture line. There is some erythema at the staple sites. The Steri-Strips were removed. With pressure on the incision there is no additional fluid that is expressed. Steri-Strips were replaced.  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 In the setting of her having breast cancer twice, I feel that this may be related to a BRCA mutation. The patient will require postoperative genetic testing as she has daughters.  She was unfortunately this suboptimally cyto-reduced secondary to the extent of her disease. She's undergone one cycle of chemotherapy with what sounds like a positive response and her ascites has not reaccumulated. She scheduled for her second cycle of chemotherapy with Dr. Mariel Sleet. We will start her on doxycycline 100 mg twice daily for 7 days. She'll contact me if there's any additional fluid added drainage. She will followup with me as Dr. Mariel Sleet states after her third cycle with a CAT scan.  Their questions were elicited in answer to their satisfaction.  Kazoua Gossen A., MD 06/19/2012, 12:03 PM

## 2012-06-19 NOTE — Progress Notes (Signed)
1800 Encountered patient and daughter at short stay entrance as I was leaving hospital. Patient reports large amount of clear fluid leaked from incision line. Undergarments and seat of her pants were saturated. Steri-strips remain intact and no dehiscence at incision line. Patient was taken back to clinic by w/c and ABD pads with pressure dressing applied to incision line. Dr. Mariel Sleet notified and he placed call to GYN oncologist on call. Patient instructed to report to ED with any bleeding or increased pain.

## 2012-06-19 NOTE — Patient Instructions (Signed)
Doxycycline delayed-release tablets What is this medicine? DOXYCYCLINE (dox i SYE kleen) is a tetracycline antibiotic. It kills certain bacteria or stops their growth. It is used to treat many kinds of infections, like dental, skin, respiratory, and urinary tract infections. It also treats acne, Lyme disease, malaria, and certain sexually transmitted infections. This medicine may be used for other purposes; ask your health care provider or pharmacist if you have questions. What should I tell my health care provider before I take this medicine? They need to know if you have any of these conditions: -bowel disease like colitis -liver disease -long exposure to sunlight like working outdoors -an unusual or allergic reaction to doxycycline, tetracycline antibiotics, other medicines, foods, dyes, or preservatives -pregnant or trying to get pregnant -breast-feeding How should I use this medicine? Take this medicine by mouth with a full glass of water. Follow the directions on the prescription label. Do not crush or chew. It is best to take this medicine without food, but if it upsets your stomach take it with food. Take your medicine at regular intervals. Do not take your medicine more often than directed. Take all of your medicine as directed even if you think your are better. Do not skip doses or stop your medicine early. Talk to your pediatrician regarding the use of this medicine in children. Special care may be needed. While this drug may be prescribed for children as young as 80 years old for selected conditions, precautions do apply. Overdosage: If you think you have taken too much of this medicine contact a poison control center or emergency room at once. NOTE: This medicine is only for you. Do not share this medicine with others. What if I miss a dose? If you miss a dose, take it as soon as you can. If it is almost time for your next dose, take only that dose. Do not take double or extra  doses. What may interact with this medicine? -antacids -barbiturates -birth control pills -bismuth subsalicylate -carbamazepine -methoxyflurane -other antibiotics -phenytoin -vitamins that contain iron -warfarin This list may not describe all possible interactions. Give your health care provider a list of all the medicines, herbs, non-prescription drugs, or dietary supplements you use. Also tell them if you smoke, drink alcohol, or use illegal drugs. Some items may interact with your medicine. What should I watch for while using this medicine? Tell your doctor or health care professional if your symptoms do not improve. Do not treat diarrhea with over the counter products. Contact your doctor if you have diarrhea that lasts more than 2 days or if it is severe and watery. Do not take this medicine just before going to bed. It may not dissolve properly when you lay down and can cause pain in your throat. Drink plenty of fluids while taking this medicine to also help reduce irritation in your throat. This medicine can make you more sensitive to the sun. Keep out of the sun. If you cannot avoid being in the sun, wear protective clothing and use sunscreen. Do not use sun lamps or tanning beds/booths. Birth control pills may not work properly while you are taking this medicine. Talk to your doctor about using an extra method of birth control. If you are being treated for a sexually transmitted infection, avoid sexual contact until you have finished your treatment. Your sexual partner may also need treatment. Avoid antacids, aluminum, calcium, magnesium, and iron products for 4 hours before and 2 hours after taking a dose of this  medicine. If you are using this medicine to prevent malaria, you should still protect yourself from contact with mosquitos. Stay in screened-in areas, use mosquito nets, keep your body covered, and use an insect repellent. What side effects may I notice from receiving this  medicine? Side effects that you should report to your doctor or health care professional as soon as possible: -allergic reactions like skin rash, itching or hives, swelling of the face, lips, or tongue -difficulty breathing -fever -itching in the rectal or genital area -pain on swallowing -redness, blistering, peeling or loosening of the skin, including inside the mouth -severe stomach pain or cramps -unusual bleeding or bruising -unusually weak or tired -yellowing of the eyes or skin Side effects that usually do not require medical attention (report to your doctor or health care professional if they continue or are bothersome): -diarrhea -loss of appetite -nausea, vomiting This list may not describe all possible side effects. Call your doctor for medical advice about side effects. You may report side effects to FDA at 1-800-FDA-1088. Where should I keep my medicine? Keep out of the reach of children. Store at room temperature between 15 and 30 degrees C (59 and 86 degrees F). Protect from light. Keep container tightly closed. Throw away any unused medicine after the expiration date. Taking this medicine after the expiration date can make you seriously ill. NOTE: This sheet is a summary. It may not cover all possible information. If you have questions about this medicine, talk to your doctor, pharmacist, or health care provider.  2012, [employer]. (06/21/2007 2:48:25 PM)

## 2012-06-20 ENCOUNTER — Other Ambulatory Visit (HOSPITAL_COMMUNITY): Payer: Self-pay | Admitting: *Deleted

## 2012-06-20 DIAGNOSIS — E876 Hypokalemia: Secondary | ICD-10-CM

## 2012-06-20 MED ORDER — POTASSIUM CHLORIDE CRYS ER 20 MEQ PO TBCR: 20.0000 meq | EXTENDED_RELEASE_TABLET | Freq: Every day | ORAL | Status: DC

## 2012-06-21 ENCOUNTER — Encounter (HOSPITAL_COMMUNITY): Payer: Self-pay

## 2012-06-21 ENCOUNTER — Encounter (HOSPITAL_COMMUNITY): Payer: Self-pay | Admitting: Pharmacy Technician

## 2012-06-21 ENCOUNTER — Encounter (HOSPITAL_COMMUNITY)
Admission: RE | Admit: 2012-06-21 | Discharge: 2012-06-21 | Disposition: A | Discharge status: Home / Self Care | Payer: Medicare Other | Source: Ambulatory Visit | Attending: General Surgery | Admitting: General Surgery

## 2012-06-21 LAB — BASIC METABOLIC PANEL
Calcium: 9.2 mg/dL (ref 8.4–10.5)
GFR calc Af Amer: >90 mL/min (ref >90)
GFR calc non Af Amer: 85 mL/min — ABNORMAL LOW (ref >90)
Potassium: 3.9 mEq/L (ref 3.5–5.1)
Sodium: 139 mEq/L (ref 135–145)

## 2012-06-21 MED ORDER — CHLORHEXIDINE GLUCONATE 4 % EX LIQD: 1.0000 "application " | Freq: Once | CUTANEOUS | Status: DC

## 2012-06-21 NOTE — Patient Instructions (Addendum)
Rita Humphrey  06/21/2012   Your procedure is scheduled on:  06/25/12  Report to [facility] at 08:20 AM.  Call this number if you have problems the morning of surgery: [phone]   Remember:   Do not eat food or drink liquids after midnight.   Take these medicines the morning of surgery with A SIP OF WATER: Hydrochlorothiazide and Amlodipine. You make take your Xanax, Zofran and Percocet if needed.   Do not wear jewelry, make-up or nail polish.  Do not wear lotions, powders, or perfumes.   Do not shave 48 hours prior to surgery. Men may shave face and neck.  Do not bring valuables to the hospital.  Contacts, dentures or bridgework may not be worn into surgery.   Patients discharged the day of surgery will not be allowed to drive home.    Special Instructions: Shower using CHG 2 nights before surgery and the night before surgery.  If you shower the day of surgery use CHG.  Use special wash - you have one bottle of CHG for all showers.  You should use approximately 1/3 of the bottle for each shower.   Please read over the following fact sheets that you were given: Pain Booklet, MRSA Information, Surgical Site Infection Prevention, Anesthesia Post-op Instructions and Care and Recovery After Surgery    PATIENT INSTRUCTIONS POST-ANESTHESIA  IMMEDIATELY FOLLOWING SURGERY:  Do not drive or operate machinery for the first twenty four hours after surgery.  Do not make any important decisions for twenty four hours after surgery or while taking narcotic pain medications or sedatives.  If you develop intractable nausea and vomiting or a severe headache please notify your doctor immediately.  FOLLOW-UP:  Please make an appointment with your surgeon as instructed. You do not need to follow up with anesthesia unless specifically instructed to do so.  WOUND CARE INSTRUCTIONS (if applicable):  Keep a dry clean dressing on the anesthesia/puncture wound site if there is drainage.  Once the wound has quit  draining you may leave it open to air.  Generally you should leave the bandage intact for twenty four hours unless there is drainage.  If the epidural site drains for more than 36-48 hours please call the anesthesia department.  QUESTIONS?:  Please feel free to call your physician or the hospital operator if you have any questions, and they will be happy to assist you.

## 2012-06-21 NOTE — H&P (Signed)
  NTS SOAP Note  Vital Signs:  Vitals as of: 06/21/2012: Systolic 137: Diastolic 86: Heart Rate 96: Temp 96.47F: Height 60ft 3in: Weight 186Lbs 0 Ounces: Pain Level 6: BMI 32.95  BMI : 32.95 kg/m2  Subjective: This 72 Years 2 Months old Female presents for portacath insertion.  Is about to undergo chemotherapy for ovarian cancer.   Review of Symptoms:  Constitutional:  fatigue,weakness Head:unremarkable    Eyes:unremarkable   Nose/Mouth/Throat:unremarkable Cardiovascular:  unremarkable   Respiratory:unremarkable   Gastrointestin    abdominal pain,dyspepsia Genitourinary:unremarkable     Musculoskeletal:unremarkable   rash Hematolgic/Lymphatic:unremarkable     Allergic/Immunologic:unremarkable     Past Medical History:    Reviewed   Past Medical History  Surgical History: TAH/BSO, bilateral mastectomy, partial thyroidectomny Medical Problems: breast cancer, ovarian cancer, asthma, CHF Allergies: PCN, morphine Medications: norvasc, norco, senokot, lovenox injections   Social History:Reviewed  Social History  Preferred Language: English Race:  White Ethnicity: Not Hispanic / Latino Age: 72 Years 2 Months Marital Status:  S Alcohol:  No Recreational drug(s):  No   Smoking Status: Never smoker reviewed on 06/21/2012 Functional Status reviewed on mm/dd/yyyy ------------------------------------------------ Bathing: Normal Cooking: Normal Dressing: Normal Driving: Normal Eating: Normal Managing Meds: Normal Oral Care: Normal Shopping: Normal Toileting: Normal Transferring: Normal Walking: Normal Cognitive Status reviewed on mm/dd/yyyy ------------------------------------------------ Attention: Normal Decision Making: Normal Language: Normal Memory: Normal Motor: Normal Perception: Normal Problem Solving: Normal Visual and Spatial: Normal   Family History:  Reviewed   Family History              Father:   Hypertension             Mother:  Cancer             Sibling:  Hypertension, Coronary Artery Disease    Objective Information: General:  Well appearing, well nourished in no distress. Neck:  Supple without lymphadenopathy.  Heart:  RRR, no murmur Lungs:    CTA bilaterally, no wheezes, rhonchi, rales.  Breathing unlabored.  Assessment:Ovarian cancer, need for central venous access  Diagnosis &amp; Procedure Smart Code   Plan:Scheduled for portacath insertion on 06/25/12.   Patient Education:Alternative treatments to surgery were discussed with patient (and family).  Risks and benefits  of procedure including bleeding, infection, and pneumothorax were fully explained to the patient (and family) who gave informed consent. Patient/family questions were addressed.  Do not take lovenox morning of surgery.  Follow-up:Pending Surgery

## 2012-06-25 ENCOUNTER — Ambulatory Visit (HOSPITAL_COMMUNITY): Payer: Medicare Other

## 2012-06-25 ENCOUNTER — Ambulatory Visit (HOSPITAL_COMMUNITY)
Admission: RE | Admit: 2012-06-25 | Discharge: 2012-06-25 | Disposition: A | Discharge status: Home / Self Care | Payer: Medicare Other | Source: Ambulatory Visit | Attending: General Surgery | Admitting: General Surgery

## 2012-06-25 ENCOUNTER — Encounter (HOSPITAL_COMMUNITY)
Admission: RE | Disposition: A | Discharge status: Home / Self Care | Payer: Self-pay | Source: Ambulatory Visit | Attending: General Surgery

## 2012-06-25 ENCOUNTER — Encounter (HOSPITAL_COMMUNITY): Payer: Self-pay | Admitting: *Deleted

## 2012-06-25 ENCOUNTER — Encounter (HOSPITAL_COMMUNITY): Payer: Self-pay | Admitting: Anesthesiology

## 2012-06-25 ENCOUNTER — Ambulatory Visit (HOSPITAL_COMMUNITY): Payer: Medicare Other | Admitting: Anesthesiology

## 2012-06-25 DIAGNOSIS — C569 Malignant neoplasm of unspecified ovary: Secondary | ICD-10-CM | POA: Insufficient documentation

## 2012-06-25 DIAGNOSIS — I10 Essential (primary) hypertension: Secondary | ICD-10-CM | POA: Insufficient documentation

## 2012-06-25 HISTORY — PX: PORTACATH PLACEMENT: SHX2246

## 2012-06-25 SURGERY — INSERTION, TUNNELED CENTRAL VENOUS DEVICE, WITH PORT
Anesthesia: Monitor Anesthesia Care | Site: Chest | Laterality: Left | Wound class: Clean

## 2012-06-25 MED ORDER — HEPARIN SOD (PORK) LOCK FLUSH 100 UNIT/ML IV SOLN
INTRAVENOUS | Status: DC | PRN
  Administered 2012-06-25: 500 [IU] via INTRAVENOUS

## 2012-06-25 MED ORDER — LACTATED RINGERS IV SOLN
INTRAVENOUS | Status: DC | PRN
  Administered 2012-06-25: 10:00:00 via INTRAVENOUS

## 2012-06-25 MED ORDER — KETOROLAC TROMETHAMINE 30 MG/ML IJ SOLN
INTRAMUSCULAR | Status: AC
  Filled 2012-06-25: qty 1

## 2012-06-25 MED ORDER — LACTATED RINGERS IV SOLN
INTRAVENOUS | Status: DC
  Administered 2012-06-25: 1000 mL via INTRAVENOUS

## 2012-06-25 MED ORDER — SODIUM CHLORIDE 0.9 % IV SOLN
INTRAVENOUS | Status: DC | PRN
  Administered 2012-06-25: 500 mL via INTRAMUSCULAR

## 2012-06-25 MED ORDER — FENTANYL CITRATE 0.05 MG/ML IJ SOLN: 25.0000 ug | INTRAMUSCULAR | Status: DC | PRN

## 2012-06-25 MED ORDER — ONDANSETRON HCL 4 MG/2ML IJ SOLN
INTRAMUSCULAR | Status: AC
  Filled 2012-06-25: qty 2

## 2012-06-25 MED ORDER — LIDOCAINE HCL (PF) 1 % IJ SOLN
INTRAMUSCULAR | Status: AC
  Filled 2012-06-25: qty 30

## 2012-06-25 MED ORDER — PROPOFOL 10 MG/ML IV EMUL
INTRAVENOUS | Status: AC
  Filled 2012-06-25: qty 40

## 2012-06-25 MED ORDER — VANCOMYCIN HCL IN DEXTROSE 1-5 GM/200ML-% IV SOLN
INTRAVENOUS | Status: AC
  Filled 2012-06-25: qty 200

## 2012-06-25 MED ORDER — KETOROLAC TROMETHAMINE 30 MG/ML IJ SOLN
30.0000 mg | Freq: Once | INTRAMUSCULAR | Status: AC
  Administered 2012-06-25: 30 mg via INTRAVENOUS

## 2012-06-25 MED ORDER — HEPARIN SOD (PORK) LOCK FLUSH 100 UNIT/ML IV SOLN
INTRAVENOUS | Status: AC
  Filled 2012-06-25: qty 5

## 2012-06-25 MED ORDER — FENTANYL CITRATE 0.05 MG/ML IJ SOLN
25.0000 ug | INTRAMUSCULAR | Status: DC | PRN
  Administered 2012-06-25 (×2): 25 ug via INTRAVENOUS

## 2012-06-25 MED ORDER — MIDAZOLAM HCL 2 MG/2ML IJ SOLN
INTRAMUSCULAR | Status: AC
  Filled 2012-06-25: qty 2

## 2012-06-25 MED ORDER — ONDANSETRON HCL 4 MG/2ML IJ SOLN: 4.0000 mg | Freq: Once | INTRAMUSCULAR | Status: DC | PRN

## 2012-06-25 MED ORDER — PROPOFOL INFUSION 10 MG/ML OPTIME
INTRAVENOUS | Status: DC | PRN
  Administered 2012-06-25: 120 ug/kg/min via INTRAVENOUS

## 2012-06-25 MED ORDER — MIDAZOLAM HCL 2 MG/2ML IJ SOLN
1.0000 mg | INTRAMUSCULAR | Status: DC | PRN
  Administered 2012-06-25: 2 mg via INTRAVENOUS

## 2012-06-25 MED ORDER — FENTANYL CITRATE 0.05 MG/ML IJ SOLN
INTRAMUSCULAR | Status: AC
  Filled 2012-06-25: qty 2

## 2012-06-25 MED ORDER — ONDANSETRON HCL 4 MG/2ML IJ SOLN
4.0000 mg | Freq: Once | INTRAMUSCULAR | Status: AC
  Administered 2012-06-25: 4 mg via INTRAVENOUS

## 2012-06-25 MED ORDER — VANCOMYCIN HCL IN DEXTROSE 1-5 GM/200ML-% IV SOLN: 1000.0000 mg | INTRAVENOUS | Status: DC

## 2012-06-25 MED ORDER — LIDOCAINE HCL (PF) 1 % IJ SOLN
INTRAMUSCULAR | Status: DC | PRN
  Administered 2012-06-25: 5 mL

## 2012-06-25 SURGICAL SUPPLY — 31 items
BAG DECANTER FOR FLEXI CONT (MISCELLANEOUS) ×2 IMPLANT
BAG HAMPER (MISCELLANEOUS) ×2 IMPLANT
CLOTH BEACON ORANGE TIMEOUT ST (SAFETY) ×2 IMPLANT
COVER LIGHT HANDLE STERIS (MISCELLANEOUS) ×4 IMPLANT
DECANTER SPIKE VIAL GLASS SM (MISCELLANEOUS) ×2 IMPLANT
DERMABOND ADVANCED (GAUZE/BANDAGES/DRESSINGS) ×1
DERMABOND ADVANCED .7 DNX12 (GAUZE/BANDAGES/DRESSINGS) ×1 IMPLANT
DRAPE C-ARM FOLDED MOBILE STRL (DRAPES) ×2 IMPLANT
DURAPREP 26ML APPLICATOR (WOUND CARE) ×2 IMPLANT
ELECT REM PT RETURN 9FT ADLT (ELECTROSURGICAL) ×2
ELECTRODE REM PT RTRN 9FT ADLT (ELECTROSURGICAL) ×1 IMPLANT
GLOVE BIO SURGEON STRL SZ7.5 (GLOVE) ×2 IMPLANT
GLOVE BIOGEL PI IND STRL 7.5 (GLOVE) ×1 IMPLANT
GLOVE BIOGEL PI INDICATOR 7.5 (GLOVE) ×1
GLOVE ECLIPSE 7.0 STRL STRAW (GLOVE) ×2 IMPLANT
GLOVE EXAM NITRILE LRG STRL (GLOVE) ×2 IMPLANT
GOWN STRL REIN XL XLG (GOWN DISPOSABLE) ×4 IMPLANT
IV NS 500ML (IV SOLUTION) ×1
IV NS 500ML BAXH (IV SOLUTION) ×1 IMPLANT
KIT PORT POWER 8FR ISP MRI (CATHETERS) ×2 IMPLANT
KIT ROOM TURNOVER APOR (KITS) ×2 IMPLANT
MANIFOLD NEPTUNE II (INSTRUMENTS) ×2 IMPLANT
NEEDLE HYPO 25X1 1.5 SAFETY (NEEDLE) ×2 IMPLANT
PACK MINOR (CUSTOM PROCEDURE TRAY) ×2 IMPLANT
PAD ARMBOARD 7.5X6 YLW CONV (MISCELLANEOUS) ×2 IMPLANT
SET BASIN LINEN APH (SET/KITS/TRAYS/PACK) ×2 IMPLANT
SUT VIC AB 3-0 SH 27 (SUTURE) ×1
SUT VIC AB 3-0 SH 27X BRD (SUTURE) ×1 IMPLANT
SUT VIC AB 4-0 PS2 27 (SUTURE) ×2 IMPLANT
SYR CONTROL 10ML LL (SYRINGE) ×2 IMPLANT
SYRINGE 10CC LL (SYRINGE) ×2 IMPLANT

## 2012-06-25 NOTE — Progress Notes (Signed)
CXR done

## 2012-06-25 NOTE — Transfer of Care (Signed)
Immediate Anesthesia Transfer of Care Note  Patient: Rita Humphrey  Procedure(s) Performed: Procedure(s): INSERTION PORT-A-CATH (Left)  Patient Location: PACU  Anesthesia Type:MAC  Level of Consciousness: awake, alert  and oriented  Airway & Oxygen Therapy: Patient Spontanous Breathing and Patient connected to nasal cannula oxygen  Post-op Assessment: Report given to PACU RN and Post -op Vital signs reviewed and stable  Post vital signs: Reviewed and stable  Complications: No apparent anesthesia complications

## 2012-06-25 NOTE — Progress Notes (Signed)
Dr Lovell Sheehan in, CXR good. No pneumo.

## 2012-06-25 NOTE — Preoperative (Signed)
Beta Blockers   Reason not to administer Beta Blockers:Not Applicable 

## 2012-06-25 NOTE — Op Note (Signed)
Patient:  Rita Humphrey  DOB:  Oct 18, 1940  MRN:  [id number]   Preop Diagnosis:  Metastatic ovarian cancer, need for central venous access  Postop Diagnosis:  Same  Procedure:  Power port insertion  Surgeon:  Franky Macho, M.D.  Anes:  MAC  Indications:  Patient is a 72 year old white female who presents for power port insertion. She is undergoing chemotherapy for metastatic ovarian cancer. The risks and benefits of the procedure including bleeding, infection, and pneumothorax were fully explained to the patient, who gave informed consent.  Procedure note:  The patient is placed the Trendelenburg position after the left upper chest was prepped and draped using usual sterile technique with DuraPrep. Surgical site confirmation was performed. 1% Xylocaine was used for local anesthesia.  An incision was made below left clavicle. A subcutaneous pocket was then formed. A needle is advanced into the left subclavian vein using the Seldinger technique without difficulty. A guidewire was then advanced into the right atrium under fluoroscopic guidance. An introducer and peel-away sheath were placed over the guidewire. The catheter was then inserted through the peel-away sheath the peel-away sheath was removed. The catheter was then attached to the port and the port placed in subcutaneous pocket. Adequate positioning was confirmed by fluoroscopy. Good backflow was noted in the port. The port was flushed with heparin flush. The subcutaneous layer was reapproximated using 3-0 Vicryl interrupted suture. The skin was closed using a 4 Vicryl subcuticular suture. Dermabond was then applied.  All tape and needle counts were correct the end of the procedure. Patient was transferred to PACU in stable condition. A chest x-ray will be performed at that time.  Complications:  None  EBL:  Minimal  Specimen:  None

## 2012-06-25 NOTE — Anesthesia Preprocedure Evaluation (Signed)
Anesthesia Evaluation  Patient identified by MRN, date of birth, ID band Patient awake    Reviewed: Allergy & Precautions, H&P , NPO status , Patient's Chart, lab work & pertinent test results  Airway Mallampati: II TM Distance: >3 FB Neck ROM: Full    Dental  (+) Dental Advisory Given, Partial Upper and Partial Lower   Pulmonary shortness of breath, asthma ,  breath sounds clear to auscultation        Cardiovascular hypertension, Pt. on medications + Peripheral Vascular Disease Rhythm:Regular Rate:Normal     Neuro/Psych negative neurological ROS  negative psych ROS   GI/Hepatic negative GI ROS, Neg liver ROS, GERD-  ,  Endo/Other  negative endocrine ROS  Renal/GU negative Renal ROS     Musculoskeletal negative musculoskeletal ROS (+)   Abdominal   Peds  Hematology negative hematology ROS (+)   Anesthesia Other Findings   Reproductive/Obstetrics                           Anesthesia Physical Anesthesia Plan  ASA: III  Anesthesia Plan: MAC   Post-op Pain Management:    Induction: Intravenous  Airway Management Planned: Simple Face Mask  Additional Equipment:   Intra-op Plan:   Post-operative Plan:   Informed Consent: I have reviewed the patients History and Physical, chart, labs and discussed the procedure including the risks, benefits and alternatives for the proposed anesthesia with the patient or authorized representative who has indicated his/her understanding and acceptance.     Plan Discussed with:   Anesthesia Plan Comments:         Anesthesia Quick Evaluation

## 2012-06-25 NOTE — Interval H&P Note (Signed)
History and Physical Interval Note:  06/25/2012 9:58 AM  Rita Humphrey  has presented today for surgery, with the diagnosis of metastatic ovarian cancer  The various methods of treatment have been discussed with the patient and family. After consideration of risks, benefits and other options for treatment, the patient has consented to  Procedure(s): INSERTION PORT-A-CATH (N/A) as a surgical intervention .  The patient's history has been reviewed, patient examined, no change in status, stable for surgery.  I have reviewed the patient's chart and labs.  Questions were answered to the patient's satisfaction.     Franky Macho A

## 2012-06-25 NOTE — Anesthesia Postprocedure Evaluation (Signed)
  Anesthesia Post-op Note  Patient: Rita Humphrey  Procedure(s) Performed: Procedure(s): INSERTION PORT-A-CATH (Left)  Patient Location: PACU  Anesthesia Type:MAC  Level of Consciousness: awake, alert  and oriented  Airway and Oxygen Therapy: Patient Spontanous Breathing and Patient connected to nasal cannula oxygen  Post-op Pain: none  Post-op Assessment: Post-op Vital signs reviewed, Patient's Cardiovascular Status Stable, Respiratory Function Stable, Patent Airway and No signs of Nausea or vomiting  Post-op Vital Signs: Reviewed and stable  Complications: No apparent anesthesia complications

## 2012-06-25 NOTE — Anesthesia Procedure Notes (Addendum)
Procedure Name: MAC Date/Time: 06/25/2012 10:35 AM Performed by: Glendora Score A Pre-anesthesia Checklist: Patient identified, Emergency Drugs available, Suction available and Patient being monitored Patient Re-evaluated:Patient Re-evaluated prior to inductionOxygen Delivery Method: Nasal cannula Ventilation: Oral airway inserted - appropriate to patient size Placement Confirmation: positive ETCO2

## 2012-06-26 ENCOUNTER — Other Ambulatory Visit (HOSPITAL_COMMUNITY): Payer: Self-pay | Admitting: Oncology

## 2012-06-26 MED ORDER — LIDOCAINE-PRILOCAINE 2.5-2.5 % EX CREA: TOPICAL_CREAM | CUTANEOUS | Status: DC | PRN

## 2012-06-26 MED ORDER — DEXAMETHASONE 4 MG PO TABS: ORAL_TABLET | ORAL | Status: DC

## 2012-06-26 NOTE — Patient Instructions (Addendum)
[facility]   CHEMOTHERAPY INSTRUCTIONS  Taxol - the first time you receive this drug we will titrate it very slowly to ensure that you do not have or are not having an allergic reaction to the chemo. You will also be responsible for taking a steroid called Dexamethasone before and after chemo. This is to reduce your risk of having an allergic reaction to the Taxol. Side Effects: hair loss, abdominal cramps, lowers your white blood cells (fight infection), muscle aches, nausea/vomiting, irritation to the mouth (mouth sores, pain in your mouth) *neuropathy - numbness/tingling/burning in hands/fingers/feet/toes. We need to know as soon as this begins to happen so that we can monitor it and treat if necessary. The numbness generally begins in the fingertips of tips of toes and then begins to travel up the finger/toe/hand/foot. We never want you getting to where you can't pick up a pen, coin, zip a zipper, button a button, or have trouble walking. You must tell us immediately if you are experiencing peripheral neuropathy!  Carboplatin - this medication can be hard on your kidneys - this is why we need you to drink 64 oz of fluid (preferably water/decaff fluids) 2 days prior to chemo and for up to 4-5 days after chemo. Drink more if you can. This will help to keep your kidneys flushed. This can cause mild hair loss, lower your platelets (which make your blood clot), lower your white blood cells (fight infection), and cause nausea/vomiting.   Neulasta - this is not chemo but being given to you because you have had chemo. This medication works by stimulating your bone marrow which is inside of your bones to produce more white blood cells. White blood cells protect [place] from infection. Chemo destroys your cancer cells and white blood cells and that is why you need the Neulasta. This medication will be given to you by a nurse in the Cancer Center. It is given 20-24 hours from the  completion of chemo. It comes in the form of an injection. It will be administered into the fatty tissue of your abdomen. The most common side effect associated with this injection is bone pain. The bone pain can occur as soon as a few hours after the injection to several days after the injection. Some people have mild bone pain and some have none. Few people experience severe bone pain. We want and need to know how you respond to this injection. If you develop moderate to severe bone pain - we need to know! The drug of choice to help relieve the bone pain is Aleve. Take as bottle directs. If another doctor has told you to avoid NSAIDs - like Aleve or Ibuprofen, then you may take Tylenol. You may also apply a heating pad to the affected areas.  POTENTIAL SIDE EFFECTS OF TREATMENT: Increased Susceptibility to Infection, Vomiting, Constipation, Hair Thinning, Changes in Character of Skin and Nails (brittleness, dryness,etc.), Bone Marrow Suppression, Abdominal Cramping, Complete Hair Loss, Nausea, Diarrhea, Sun Sensitivity and Mouth Sores   SELF IMAGE NEEDS AND REFERRALS MADE: Obtain hair accessories as soon as possible (wigs, scarves, turbans,caps,etc.)  Referral to Look Good, Feel Better consultant   EDUCATIONAL MATERIALS GIVEN AND REVIEWED: Chemotherapy and You  Specific Instructions Sheets: Carboplatin, Taxol, Neulasta, Dexamethasone, Zofran, Ativan, Pepcid, Benadryl, EMLA cream   SELF CARE ACTIVITIES WHILE ON CHEMOTHERAPY: Increase your fluid intake 48 hours prior to treatment and drink at least 2 quarts per day after treatment., No alcohol intake., No  aspirin or other medications unless approved by your oncologist., Eat foods that are light and easy to digest., Eat foods at cold or room temperature., No fried, fatty, or spicy foods immediately before or after treatment., Have teeth cleaned professionally before starting treatment. Keep dentures and partial plates clean., Use soft toothbrush and  do not use mouthwashes that contain alcohol. Biotene is a good mouthwash that is available at most pharmacies or may be ordered by calling [phone]., Use warm salt water gargles (1 teaspoon salt per 1 quart warm water) before and after meals and at bedtime. Or you may rinse with 2 tablespoons of three -percent hydrogen peroxide mixed in eight ounces of water., Always use sunscreen with SPF (Sun Protection Factor) of 30 or higher., Use your nausea medication as directed to prevent nausea., Use your stool softener or laxative as directed to prevent constipation. and Use your anti-diarrheal medication as directed to stop diarrhea.  Please wash your hands for at least 30 seconds using warm soapy water. Handwashing is the #1 way to prevent the spread of germs. Stay away from sick people or people who are getting over a cold. If you develop respiratory systems such as green/yellow mucus production or productive cough or persistent cough let us know and we will see if you need an antibiotic. It is a good idea to keep a pair of gloves on when going into grocery stores/[employer] to decrease your risk of coming into contact with germs on the carts, etc. Carry alcohol hand gel with you at all times and use it frequently if out in public. All foods need to be cooked thoroughly. No raw foods. No medium or undercooked meats, eggs. If your food is cooked medium well, it does not need to be hot pink or saturated with bloody liquid at all. Vegetables and fruits need to be washed/rinsed under the faucet with a dish detergent before being consumed. You can eat raw fruits and vegetables unless we tell you otherwise but it would be best if you cooked them or bought frozen. Do not eat off of salad bars or hot bars unless you really trust the cleanliness of the restaurant. If you need dental work, please let Dr. Mariel Sleet know before you go for your appointment so that we can coordinate the best possible time for you in regards to  your chemo regimen. You need to also let your dentist know that you are actively taking chemo. We may need to do labs prior to your dental appointment. We also want your bowels moving at least every other day. If this is not happening, we need to know so that we can get you on a bowel regimen to help you go.      MEDICATIONS: You have been given prescriptions for the following medications:  Dexamethasone 4mg  tablet. At 9pm the night before chemo, take 2 tablets. At 3am the morning of chemo, take 2 tablets. Starting the day after chemo, take 2 tablets in the am and 2 tablets in the pm for 2 days.   Zofran/ondansetron 8mg  tablet. Starting the day after chemo, take 1 tablet in the am and 1 tablet in the pm for 2 days. Then may take 1 tablet every 8 hours if needed for nausea/vomiting.   EMLA cream. Apply a quarter size amount to port site 1 hour prior to chemo. Do not rub in. Cover with plastic.   Over-the-Counter Meds:  Colace - this is a stool softener. Take 100mg  capsule 2-6 times  a day as needed. If you have to take more than 6 capsules of Colace a day call the Cancer Center.  Senna - this is a mild laxative used to treat mild constipation. May take 2 tabs by mouth daily or up to twice a day as needed for mild constipation,  Milk of Magnesia - this is a laxative used to treat moderate to severe constipation. May take 2-4 tablespoons every 8 hours as needed. May increase to 8 tablespoons x 1 dose and if no bowel movement call the Cancer Center.  Imodium - this is for diarrhea. Take 2 tabs after 1st loose stool and then 1 tab after each loose stool until you go a total of 12 hours without a loose stool. Call Cancer Center if loose stools continue.   SYMPTOMS TO REPORT AS SOON AS POSSIBLE AFTER TREATMENT:  FEVER GREATER THAN 100.5 F  CHILLS WITH OR WITHOUT FEVER  NAUSEA AND VOMITING THAT IS NOT CONTROLLED WITH YOUR NAUSEA MEDICATION  UNUSUAL SHORTNESS OF BREATH  UNUSUAL BRUISING OR  BLEEDING  TENDERNESS IN MOUTH AND THROAT WITH OR WITHOUT PRESENCE OF ULCERS  URINARY PROBLEMS  BOWEL PROBLEMS  UNUSUAL RASH    Wear comfortable clothing and clothing appropriate for easy access to any Portacath or PICC line. Let us know if there is anything that we can do to make your therapy better!      I have been informed and understand all of the instructions given to me and have received a copy. I have been instructed to call the clinic [phone] or my family physician as soon as possible for continued medical care, if indicated. I do not have any more questions at this time but understand that I may call the Cancer Center or the Patient Navigator at [phone] during office hours should I have questions or need assistance in obtaining follow-up care.      _________________________________________      _______________     __________ Signature of Patient or Authorized Representative        Date                            Time      _________________________________________ Nurse's Signature

## 2012-06-27 ENCOUNTER — Encounter (HOSPITAL_COMMUNITY): Payer: Self-pay | Admitting: General Surgery

## 2012-06-27 NOTE — Addendum Note (Signed)
Addendum created 06/27/12 4540 by Franco Nones, CRNA   Modules edited: Charges VN

## 2012-06-28 ENCOUNTER — Encounter (HOSPITAL_BASED_OUTPATIENT_CLINIC_OR_DEPARTMENT_OTHER): Payer: Medicare Other

## 2012-06-28 ENCOUNTER — Encounter (HOSPITAL_COMMUNITY): Payer: Medicare Other

## 2012-06-28 VITALS — BP 132/67 | HR 89 | Temp 98.3°F | Resp 18

## 2012-06-28 DIAGNOSIS — C786 Secondary malignant neoplasm of retroperitoneum and peritoneum: Secondary | ICD-10-CM

## 2012-06-28 DIAGNOSIS — N9489 Other specified conditions associated with female genital organs and menstrual cycle: Secondary | ICD-10-CM

## 2012-06-28 DIAGNOSIS — C569 Malignant neoplasm of unspecified ovary: Secondary | ICD-10-CM

## 2012-06-28 DIAGNOSIS — C800 Disseminated malignant neoplasm, unspecified: Secondary | ICD-10-CM

## 2012-06-28 DIAGNOSIS — Z5111 Encounter for antineoplastic chemotherapy: Secondary | ICD-10-CM

## 2012-06-28 DIAGNOSIS — C579 Malignant neoplasm of female genital organ, unspecified: Secondary | ICD-10-CM

## 2012-06-28 LAB — CBC WITH DIFFERENTIAL/PLATELET
Basophils Absolute: 0 10*3/uL (ref 0.0–0.1)
Eosinophils Relative: 0 % (ref 0–5)
HCT: 39.8 % (ref 36.0–46.0)
Hemoglobin: 13 g/dL (ref 12.0–15.0)
Lymphocytes Relative: 17 % (ref 12–46)
MCHC: 32.7 g/dL (ref 30.0–36.0)
MCV: 82.1 fL (ref 78.0–100.0)
Monocytes Absolute: 0.1 10*3/uL (ref 0.1–1.0)
Monocytes Relative: 1 % — ABNORMAL LOW (ref 3–12)
Neutro Abs: 7.1 10*3/uL (ref 1.7–7.7)
RDW: 16.7 % — ABNORMAL HIGH (ref 11.5–15.5)
WBC: 8.6 10*3/uL (ref 4.0–10.5)

## 2012-06-28 LAB — COMPREHENSIVE METABOLIC PANEL
BUN: 11 mg/dL (ref 6–23)
CO2: 25 mEq/L (ref 19–32)
Calcium: 9.7 mg/dL (ref 8.4–10.5)
Chloride: 102 mEq/L (ref 96–112)
Creatinine, Ser: 0.64 mg/dL (ref 0.50–1.10)
GFR calc Af Amer: >90 mL/min (ref >90)
GFR calc non Af Amer: 87 mL/min — ABNORMAL LOW (ref >90)
Total Bilirubin: 0.3 mg/dL (ref 0.3–1.2)

## 2012-06-28 MED ORDER — FAMOTIDINE IN NACL 20-0.9 MG/50ML-% IV SOLN
INTRAVENOUS | Status: AC
  Filled 2012-06-28: qty 50

## 2012-06-28 MED ORDER — SODIUM CHLORIDE 0.9 % IV SOLN
Freq: Once | INTRAVENOUS | Status: AC
  Administered 2012-06-28: 10:00:00 via INTRAVENOUS

## 2012-06-28 MED ORDER — DIPHENHYDRAMINE HCL 50 MG/ML IJ SOLN
50.0000 mg | Freq: Once | INTRAMUSCULAR | Status: AC
  Administered 2012-06-28: 50 mg via INTRAVENOUS

## 2012-06-28 MED ORDER — FAMOTIDINE IN NACL 20-0.9 MG/50ML-% IV SOLN
20.0000 mg | Freq: Once | INTRAVENOUS | Status: AC
  Administered 2012-06-28: 20 mg via INTRAVENOUS

## 2012-06-28 MED ORDER — SODIUM CHLORIDE 0.9 % IV SOLN
Freq: Once | INTRAVENOUS | Status: AC
  Administered 2012-06-28: 16 mg via INTRAVENOUS
  Filled 2012-06-28: qty 8

## 2012-06-28 MED ORDER — SODIUM CHLORIDE 0.9 % IV SOLN
650.0000 mg | Freq: Once | INTRAVENOUS | Status: AC
  Administered 2012-06-28: 650 mg via INTRAVENOUS
  Filled 2012-06-28: qty 65

## 2012-06-28 MED ORDER — DIPHENHYDRAMINE HCL 50 MG/ML IJ SOLN
INTRAMUSCULAR | Status: AC
  Filled 2012-06-28: qty 1

## 2012-06-28 MED ORDER — HEPARIN SOD (PORK) LOCK FLUSH 100 UNIT/ML IV SOLN
INTRAVENOUS | Status: AC
  Filled 2012-06-28: qty 5

## 2012-06-28 MED ORDER — PACLITAXEL CHEMO INJECTION 300 MG/50ML
175.0000 mg/m2 | Freq: Once | INTRAVENOUS | Status: AC
  Administered 2012-06-28: 342 mg via INTRAVENOUS
  Filled 2012-06-28: qty 57

## 2012-06-28 MED ORDER — HEPARIN SOD (PORK) LOCK FLUSH 100 UNIT/ML IV SOLN
500.0000 [IU] | Freq: Once | INTRAVENOUS | Status: AC | PRN
  Administered 2012-06-28: 500 [IU]
  Filled 2012-06-28: qty 5

## 2012-06-28 NOTE — Progress Notes (Signed)
Patient tolerating chemo well.Abdominal  Incision line assessed and looks wnl. No drainage noted.

## 2012-06-29 ENCOUNTER — Encounter (HOSPITAL_BASED_OUTPATIENT_CLINIC_OR_DEPARTMENT_OTHER): Payer: Medicare Other

## 2012-06-29 DIAGNOSIS — C569 Malignant neoplasm of unspecified ovary: Secondary | ICD-10-CM

## 2012-06-29 DIAGNOSIS — Z5189 Encounter for other specified aftercare: Secondary | ICD-10-CM

## 2012-06-29 DIAGNOSIS — N9489 Other specified conditions associated with female genital organs and menstrual cycle: Secondary | ICD-10-CM

## 2012-06-29 DIAGNOSIS — C786 Secondary malignant neoplasm of retroperitoneum and peritoneum: Secondary | ICD-10-CM

## 2012-06-29 DIAGNOSIS — C579 Malignant neoplasm of female genital organ, unspecified: Secondary | ICD-10-CM

## 2012-06-29 MED ORDER — PEGFILGRASTIM INJECTION 6 MG/0.6ML
SUBCUTANEOUS | Status: AC
  Filled 2012-06-29: qty 0.6

## 2012-06-29 MED ORDER — PEGFILGRASTIM INJECTION 6 MG/0.6ML
6.0000 mg | Freq: Once | SUBCUTANEOUS | Status: AC
  Administered 2012-06-29: 6 mg via SUBCUTANEOUS

## 2012-06-29 NOTE — Progress Notes (Signed)
Rita Humphrey presents today for injection per MD orders. Neulasta 6mg  administered SQ in right Abdomen. Administration without incident. Patient tolerated well. C/o drainage today from abdominal incision. Small amt noted on undergarment. Steri strips remain in place, minimal redness at incision line. Afebrile. ABD pad placed on site and pt instructed to come to ED if she experiences fever or increased leakage.

## 2012-07-02 ENCOUNTER — Telehealth (HOSPITAL_COMMUNITY): Payer: Self-pay | Admitting: *Deleted

## 2012-07-02 ENCOUNTER — Other Ambulatory Visit (HOSPITAL_COMMUNITY): Payer: Self-pay | Admitting: *Deleted

## 2012-07-02 DIAGNOSIS — R3915 Urgency of urination: Secondary | ICD-10-CM

## 2012-07-02 DIAGNOSIS — R300 Dysuria: Secondary | ICD-10-CM

## 2012-07-02 NOTE — Telephone Encounter (Signed)
Patient c/o some numbness in fingertips only. Patient had some trouble signing her name this am. Started having urgency and some burning yday. Urinating every 2 hours with minimal urine output last pm. Patient drinking only water. No nausea since Friday am. No vomiting. Patient thinks she is going to have to take a laxative today. No fever/chills. Patient would like to wait until the morning to come to clinic to void in a cup.

## 2012-07-03 ENCOUNTER — Encounter (HOSPITAL_BASED_OUTPATIENT_CLINIC_OR_DEPARTMENT_OTHER): Payer: Medicare Other

## 2012-07-03 ENCOUNTER — Encounter (HOSPITAL_BASED_OUTPATIENT_CLINIC_OR_DEPARTMENT_OTHER): Payer: Medicare Other | Admitting: Oncology

## 2012-07-03 ENCOUNTER — Other Ambulatory Visit (HOSPITAL_COMMUNITY): Payer: Self-pay | Admitting: Oncology

## 2012-07-03 VITALS — BP 118/76 | HR 101 | Temp 98.1°F | Resp 18

## 2012-07-03 DIAGNOSIS — R531 Weakness: Secondary | ICD-10-CM

## 2012-07-03 DIAGNOSIS — R3915 Urgency of urination: Secondary | ICD-10-CM

## 2012-07-03 DIAGNOSIS — C569 Malignant neoplasm of unspecified ovary: Secondary | ICD-10-CM

## 2012-07-03 DIAGNOSIS — R5381 Other malaise: Secondary | ICD-10-CM

## 2012-07-03 DIAGNOSIS — N390 Urinary tract infection, site not specified: Secondary | ICD-10-CM

## 2012-07-03 DIAGNOSIS — R112 Nausea with vomiting, unspecified: Secondary | ICD-10-CM

## 2012-07-03 DIAGNOSIS — R5383 Other fatigue: Secondary | ICD-10-CM

## 2012-07-03 DIAGNOSIS — G4700 Insomnia, unspecified: Secondary | ICD-10-CM

## 2012-07-03 DIAGNOSIS — R300 Dysuria: Secondary | ICD-10-CM

## 2012-07-03 LAB — CBC WITH DIFFERENTIAL/PLATELET
Basophils Relative: 0 % (ref 0–1)
Eosinophils Absolute: 0.8 10*3/uL — ABNORMAL HIGH (ref 0.0–0.7)
HCT: 41.1 % (ref 36.0–46.0)
Hemoglobin: 13.5 g/dL (ref 12.0–15.0)
Lymphs Abs: 1.7 10*3/uL (ref 0.7–4.0)
MCH: 26.8 pg (ref 26.0–34.0)
MCHC: 32.8 g/dL (ref 30.0–36.0)
Monocytes Absolute: 0.2 10*3/uL (ref 0.1–1.0)
Monocytes Relative: 2 % — ABNORMAL LOW (ref 3–12)
Neutro Abs: 8.1 10*3/uL — ABNORMAL HIGH (ref 1.7–7.7)

## 2012-07-03 LAB — URINALYSIS, ROUTINE W REFLEX MICROSCOPIC
Hgb urine dipstick: NEGATIVE
Nitrite: POSITIVE — AB
Protein, ur: 100 mg/dL — AB
Specific Gravity, Urine: 1.02 (ref 1.005–1.030)
Urobilinogen, UA: 4 mg/dL — ABNORMAL HIGH (ref 0.0–1.0)

## 2012-07-03 LAB — URINE MICROSCOPIC-ADD ON

## 2012-07-03 LAB — BASIC METABOLIC PANEL
BUN: 21 mg/dL (ref 6–23)
CO2: 30 mEq/L (ref 19–32)
Chloride: 99 mEq/L (ref 96–112)
Glucose, Bld: 105 mg/dL — ABNORMAL HIGH (ref 70–99)
Potassium: 4.2 mEq/L (ref 3.5–5.1)

## 2012-07-03 MED ORDER — SODIUM CHLORIDE 0.9 % IV SOLN
10.0000 mg | Freq: Once | INTRAVENOUS | Status: DC
  Filled 2012-07-03: qty 1

## 2012-07-03 MED ORDER — HEPARIN SOD (PORK) LOCK FLUSH 100 UNIT/ML IV SOLN
INTRAVENOUS | Status: AC
  Filled 2012-07-03: qty 5

## 2012-07-03 MED ORDER — SULFAMETHOXAZOLE-TRIMETHOPRIM 800-160 MG PO TABS: ORAL_TABLET | ORAL | Status: DC

## 2012-07-03 MED ORDER — HEPARIN SOD (PORK) LOCK FLUSH 100 UNIT/ML IV SOLN
500.0000 [IU] | Freq: Once | INTRAVENOUS | Status: AC
  Administered 2012-07-03: 500 [IU] via INTRAVENOUS
  Filled 2012-07-03: qty 5

## 2012-07-03 MED ORDER — SODIUM CHLORIDE 0.9 % IV SOLN
Freq: Once | INTRAVENOUS | Status: AC
  Administered 2012-07-03: 13:00:00 via INTRAVENOUS

## 2012-07-03 MED ORDER — ONDANSETRON HCL 40 MG/20ML IJ SOLN
Freq: Once | INTRAMUSCULAR | Status: AC
  Administered 2012-07-03: 16 mg via INTRAVENOUS
  Filled 2012-07-03: qty 8

## 2012-07-03 MED ORDER — SODIUM CHLORIDE 0.9 % IV SOLN
16.0000 mg | Freq: Once | INTRAVENOUS | Status: DC
  Filled 2012-07-03: qty 8

## 2012-07-03 MED ORDER — LORAZEPAM 2 MG/ML IJ SOLN: 0.5000 mg | Freq: Once | INTRAMUSCULAR | Status: DC

## 2012-07-03 NOTE — Addendum Note (Signed)
Addended by: Evelena Leyden on: 07/03/2012 02:33 PM   Modules accepted: Orders

## 2012-07-03 NOTE — Patient Instructions (Addendum)
[facility] Discharge Instructions  RECOMMENDATIONS MADE BY THE CONSULTANT AND ANY TEST RESULTS WILL BE SENT TO YOUR REFERRING PHYSICIAN.  EXAM FINDINGS BY THE PHYSICIAN TODAY AND SIGNS OR SYMPTOMS TO REPORT TO CLINIC OR PRIMARY PHYSICIAN: Exam and discussion by MD.  We will check some blood work today and will give you some IV fluids and something for nausea.  You have a urinary tract infection and we will put you on an antibiotic.  MEDICATIONS PRESCRIBED:  Septra DS take 1 twice daily for 7 days. Colace take 2 pills twice daily Take Lorazepam 0.5mg  two tablets tonight.  INSTRUCTIONS GIVEN AND DISCUSSED: Call us tomorrow with update.  SPECIAL INSTRUCTIONS/FOLLOW-UP: As scheduled.  Thank you for choosing [facility] to provide your oncology and hematology care.  To afford each patient quality time with our providers, please arrive at least 15 minutes before your scheduled appointment time.  With your help, our goal is to use those 15 minutes to complete the necessary work-up to ensure our physicians have the information they need to help with your evaluation and healthcare recommendations.    Effective January 1st, 2014, we ask that you re-schedule your appointment with our physicians should you arrive 10 or more minutes late for your appointment.  We strive to give you quality time with our providers, and arriving late affects you and other patients whose appointments are after yours.    Again, thank you for choosing [facility].  Our hope is that these requests will decrease the amount of time that you wait before being seen by our physicians.       _____________________________________________________________  Should you have questions after your visit to [facility], please contact our office at [phone] between the hours of 8:30 a.m. and 5:00 p.m.  Voicemails left after 4:30 p.m. will not be returned until the following  business day.  For prescription refill requests, have your pharmacy contact our office with your prescription refill request.

## 2012-07-03 NOTE — Progress Notes (Signed)
Labs drawn today for cbc/diff,bmp,urine

## 2012-07-03 NOTE — Progress Notes (Signed)
Tolerated well. No further c/o nausea

## 2012-07-03 NOTE — Progress Notes (Signed)
This lady is a work in today because of probable dehydration, nausea and vomiting and some diarrhea. She had been constipated without a bowel movement for approximately 5 days. She finally started moving her bowels after she took enough stimulant consisting of milk of magnesia. She however also started having nausea and vomiting during the night. She looks weak and tired. She's not they would keep down much fluid whatsoever.  Her pulses over 100. Her blood pressures only 118/76 which is much lower than usual for her. Her tongue is soft but she states are still some leakage from the lower incision but it's only clear. I did not detect tenderness except right around the incision site on her abdomen.  We'll give her fluids at this time and check some blood work today. She also is having trouble sleeping therefore we'll try to of her lorazepam tablets, 0.5 mg. If this does not work we will try some low-dose Ambien or temazepam. She will also start using to Colace by mouth twice a day for her bowels and only 2-4 tablespoonfuls of milk of magnesia every other day if need be.

## 2012-07-03 NOTE — Progress Notes (Signed)
Arrived for lab appointment and complaining with extreme weakness, nausea and vomiting since 3 am.  Denies fevers or chills.  VSS.  MD notified and exam performed.  To get IV fluids today.

## 2012-07-03 NOTE — Addendum Note (Signed)
Addended by: Evelena Leyden on: 07/03/2012 02:45 PM   Modules accepted: Orders, Medications

## 2012-07-05 LAB — URINE CULTURE

## 2012-07-06 ENCOUNTER — Telehealth (HOSPITAL_COMMUNITY): Payer: Self-pay | Admitting: Oncology

## 2012-07-06 NOTE — Telephone Encounter (Signed)
Rita Humphrey started on Bactrim on 4/29 - she states she has been taking the abx as prescribed and is feeling better as of today.  She reports constipation and has been taking colace w/o relief; advised to try MOM.

## 2012-07-09 ENCOUNTER — Telehealth (HOSPITAL_COMMUNITY): Payer: Self-pay

## 2012-07-09 ENCOUNTER — Other Ambulatory Visit (HOSPITAL_COMMUNITY): Payer: Self-pay

## 2012-07-09 DIAGNOSIS — R112 Nausea with vomiting, unspecified: Secondary | ICD-10-CM | POA: Insufficient documentation

## 2012-07-09 DIAGNOSIS — C569 Malignant neoplasm of unspecified ovary: Secondary | ICD-10-CM

## 2012-07-09 MED ORDER — ONDANSETRON HCL 8 MG PO TABS: ORAL_TABLET | ORAL | Status: DC

## 2012-07-09 MED ORDER — ONDANSETRON HCL 8 MG PO TABS: 8.0000 mg | ORAL_TABLET | Freq: Three times a day (TID) | ORAL | Status: DC | PRN

## 2012-07-09 NOTE — Telephone Encounter (Signed)
Message copied by Evelena Leyden on Mon Jul 09, 2012 11:36 AM ------      Message from: Mariel Sleet, ERIC S      Created: Mon Jul 09, 2012 10:27 AM       Ref Zofran X 3 ------

## 2012-07-09 NOTE — Telephone Encounter (Signed)
Refills e-scribed to [facility].

## 2012-07-09 NOTE — Telephone Encounter (Signed)
Patient needs refill for Zofran.  Uses [facility].

## 2012-07-10 ENCOUNTER — Encounter (HOSPITAL_COMMUNITY): Payer: Self-pay | Admitting: Oncology

## 2012-07-10 ENCOUNTER — Encounter (HOSPITAL_COMMUNITY): Payer: Medicare Other | Attending: Oncology | Admitting: Oncology

## 2012-07-10 VITALS — BP 121/76 | HR 93 | Temp 98.5°F | Resp 16 | Wt 177.4 lb

## 2012-07-10 DIAGNOSIS — B379 Candidiasis, unspecified: Secondary | ICD-10-CM | POA: Insufficient documentation

## 2012-07-10 DIAGNOSIS — G609 Hereditary and idiopathic neuropathy, unspecified: Secondary | ICD-10-CM

## 2012-07-10 DIAGNOSIS — C569 Malignant neoplasm of unspecified ovary: Secondary | ICD-10-CM | POA: Insufficient documentation

## 2012-07-10 DIAGNOSIS — B3789 Other sites of candidiasis: Secondary | ICD-10-CM

## 2012-07-10 DIAGNOSIS — N9489 Other specified conditions associated with female genital organs and menstrual cycle: Secondary | ICD-10-CM | POA: Insufficient documentation

## 2012-07-10 DIAGNOSIS — C786 Secondary malignant neoplasm of retroperitoneum and peritoneum: Secondary | ICD-10-CM | POA: Insufficient documentation

## 2012-07-10 DIAGNOSIS — R112 Nausea with vomiting, unspecified: Secondary | ICD-10-CM | POA: Insufficient documentation

## 2012-07-10 MED ORDER — CLOTRIMAZOLE-BETAMETHASONE 1-0.05 % EX CREA: TOPICAL_CREAM | Freq: Two times a day (BID) | CUTANEOUS | Status: DC

## 2012-07-10 NOTE — Patient Instructions (Addendum)
[facility] Discharge Instructions  RECOMMENDATIONS MADE BY THE CONSULTANT AND ANY TEST RESULTS WILL BE SENT TO YOUR REFERRING PHYSICIAN.  MEDICATIONS PRESCRIBED:  Lotrisone cream, apply to affected area twice daily.  This was electronically sent to [facility].  INSTRUCTIONS GIVEN AND DISCUSSED: Stool Softener:  Recommend increasing stool softener to 2 tablets twice daily.   May increase as needed Milk of Magnesia: Recommend 1-2 dose daily as needed for constipation MiraLax: Recommend 1 capful daily as needed for constipation  Would like for you to have a bowel movement every 3 days (which is your normal even prior to cancer diagnosis).  If you go more than 5 days without a bowel movement, would utilize increased dose of Milk of Magnesia (2 doses).  If no bowel movement in 6 days, take 2 doses of Milk of Magnesia, and 2 capfuls of MiraLax.  If no bowel movement in 7 days please call the clinic for advise.   SPECIAL INSTRUCTIONS/FOLLOW-UP: Recommend the use of a cane for balance during walking. Move on to cycle 3 of chemotherapy as scheduled.  CT scans as scheduled following cycle 3 of chemotherapy Return as scheduled.  Thank you for choosing [facility] to provide your oncology and hematology care.  To afford each patient quality time with our providers, please arrive at least 15 minutes before your scheduled appointment time.  With your help, our goal is to use those 15 minutes to complete the necessary work-up to ensure our physicians have the information they need to help with your evaluation and healthcare recommendations.    Effective January 1st, 2014, we ask that you re-schedule your appointment with our physicians should you arrive 10 or more minutes late for your appointment.  We strive to give you quality time with our providers, and arriving late affects you and other patients whose appointments are after yours.    Again, thank you  for choosing [facility].  Our hope is that these requests will decrease the amount of time that you wait before being seen by our physicians.       _____________________________________________________________  Should you have questions after your visit to [facility], please contact our office at [phone] between the hours of 8:30 a.m. and 5:00 p.m.  Voicemails left after 4:30 p.m. will not be returned until the following business day.  For prescription refill requests, have your pharmacy contact our office with your prescription refill request.

## 2012-07-10 NOTE — Progress Notes (Signed)
DAVIDSON,ERIC, MD [address] [place] [address]  Ovarian cancer, unspecified laterality - Plan: ondansetron (ZOFRAN) 8 MG tablet  Nausea with vomiting - Plan: ondansetron (ZOFRAN) 8 MG tablet  Omental metastasis  CURRENT THERAPY: S/P 1 cycle of Carboplatin/Paclitaxel on 06/28/2012 with Neulasta support.  She also received 1 cycle of Carboplatin/Docetaxel on 06/07/2012 with Neulasta support as an inpatient at [facility] under the care of Dr. Cyndie Chime.   INTERVAL HISTORY: Rita Humphrey 72 y.o. female returns for  regular  visit for followup of Stage III, Unresectable Ovarian Ca on systemic chemotherapy consisting of Carboplatin/Paclitaxel; starting chemotherapy on 06/07/2012.  Kambryn is doing well with regards to her tolerance of chemotherapy thus far.  She denies any nausea of vomiting.   She does have a few things that we addressed at today's visit.   1. Constipaton: She did have issues with diarrhea in the past, but that has resolved and she is now on the other end of the spectrum with constipation.  She reports that prior to her diagnosis and many years back, her usual bowel movement was every 3 days. She reports that her last BM was 6 days ago.  She is taking 2 stool softeners daily.  She is taking MOM PRN.  She has only taken up to 1 dose of MOM daily.  As a result, we discussed a more aggressive bowel regimen.  I have asked her to increase her stool softener to 2 tablets BID.  I have asked her to take MOM BID until she has a BM and then she can back off this dose.  She may add MiraLax tomorrow if no BM today.  If she does not have a BM by Wednesday/Thursday, she will contact the clinic for more directions.   2. Rash:  She has a left sided rash that is erythematous and pruritic.  She reports that it started about last week when she had issues with oral candidiasis. She has been using powder on this, but it is not improving.  It is located at the fold of her buttocks and  upper,posterior thigh.  3. She requested education regarding Docetaxel and Paclitaxel.  We discussed the side effect profile of both medications.  We discussed their relationship to each other.   4. She is also having signs and symptoms of peripheral neuropathy.  She is having difficult with her signature due to a tremor.  She is also having difficulty with her balance.  She has not fallen.  She reports that she can feel her feet.  She was questioned regarding her neurologic status.  She denies any headaches, dizziness, double vision, and slurred speech.  She does admit to difficulty forming some works and this happens very infrequently.  She reports that sometimes the words slip her mind and this could be chemobrain. She ambulates without any significant abnormalities.   I think we will need to observe this closely.  There is not an indication to stop therapy or change therapy at this time.  Oncologically, she denies any complaints and ROS questioning is negative.    Past Medical History  Diagnosis Date  . Hypertension   . Hyperlipemia   . Thyroid disease   . Asthma   . Cancer     [place]  . Breast cancer     s/p bilateral mastectomy [id number]  . Ovarian cancer 05/17/2012  . 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 Adnexal mass 05/16/2012  . Shortness of breath     with  exertion   . Peripheral vascular disease     hx of DVT   . Kidney stones     x 2  . 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     is allergic to penicillins and morphine and related.  Ms. Wirtanen had no medications administered during this visit.  Past Surgical History  Procedure Laterality Date  . Thyroid lobectomy    . Foot surgery      2 spurs taken out of foot  . Varicose vein surgery     . Bladder tack surgery     . Tonsillectomy    . Mastectomy      bilateral   . Laparotomy Bilateral 06/05/2012    Procedure: EXPLORATORY LAPAROTOMY WITH PELVIC TUMOR BIOPSY;  Surgeon: Rejeana Brock A. Duard Brady, MD;  Location: [facility];  Service: Gynecology;  Laterality: Bilateral;  . Abdominal hysterectomy  1980  . Vascular surgery      veins stripped from both legs  . Portacath placement Left 06/25/2012    Procedure: INSERTION PORT-A-CATH;  Surgeon: Dalia Heading, MD;  Location: [facility];  Service: General;  Laterality: Left;    Denies any headaches, dizziness, double vision, fevers, chills, night sweats, nausea, vomiting, diarrhea, constipation, chest pain, heart palpitations, shortness of breath, blood in stool, black tarry stool, urinary pain, urinary burning, urinary frequency, hematuria.   PHYSICAL EXAMINATION  ECOG PERFORMANCE STATUS: 2 - Symptomatic, <50% confined to bed  There were no vitals filed for this visit.  GENERAL:alert, no distress, well nourished, well developed, comfortable, cooperative, obese and alopecia SKIN: skin color, texture, turgor are normal, no rashes or significant lesions HEAD: Normocephalic, No masses, lesions, tenderness or abnormalities EYES: normal, Conjunctiva are pink and non-injected EARS: External ears normal OROPHARYNX:mucous membranes are moist  NECK: supple, no adenopathy, thyroid normal size, non-tender, without nodularity, no stridor, non-tender, trachea midline LYMPH:  no palpable lymphadenopathy, no hepatosplenomegaly BREAST:not examined LUNGS: clear to auscultation and percussion HEART: regular rate & rhythm, no murmurs, no gallops, S1 normal and S2 normal ABDOMEN:abdomen soft, non-tender, obese, normal bowel sounds, no masses or organomegaly, well healed lower abdomen midline incision without any open areas or signs of drainage and no hepatosplenomegaly BACK: Back symmetric, no curvature. EXTREMITIES:less then 2 second capillary refill, no joint  deformities, effusion, or inflammation, no edema, no skin discoloration, no clubbing, no cyanosis.  Erythematous rash at the fold where the buttocks and left thigh meet with small white raised areas within the rash.  NEURO: alert & oriented x 3 with fluent speech, no focal motor/sensory deficits, gait normal   LABORATORY DATA: CBC    Component Value Date/Time   WBC 10.9* 07/03/2012 1213   RBC 5.04 07/03/2012 1213   HGB 13.5 07/03/2012 1213   HCT 41.1 07/03/2012 1213   PLT 143* 07/03/2012 1213   MCV 81.5 07/03/2012 1213   MCH 26.8 07/03/2012 1213   MCHC 32.8 07/03/2012 1213   RDW 17.9* 07/03/2012 1213   LYMPHSABS 1.7 07/03/2012 1213   MONOABS 0.2 07/03/2012 1213   EOSABS 0.8* 07/03/2012 1213   BASOSABS 0.0 07/03/2012 1213      Chemistry      Component Value Date/Time   NA 137 07/03/2012 1213   K 4.2 07/03/2012 1213   CL 99 07/03/2012 1213   CO2 30  07/03/2012 1213   BUN 21 07/03/2012 1213   CREATININE 0.68 07/03/2012 1213      Component Value Date/Time   CALCIUM 9.4 07/03/2012 1213   ALKPHOS 110 06/28/2012 0933   AST 26 06/28/2012 0933   ALT 28 06/28/2012 0933   BILITOT 0.3 06/28/2012 0933     Lab Results  Component Value Date   CA125 1001.1* 05/17/2012       ASSESSMENT:  1. Stage III, Unresectable Ovarian Ca, now on systemic chemotherapy consisting of Carboplatin/Paclitaxel; starting chemotherapy on 06/07/2012. 2. Candidal infection of left buttock/thigh 3. Constipation 4. Grade 2 peripheral neuropathy, will monitor closely  Patient Active Problem List   Diagnosis Date Noted  . Nausea with vomiting 07/09/2012  . Thrombocytosis 05/17/2012  . Ovarian cancer 05/17/2012  . 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 Ascites 05/16/2012  . Adnexal mass 05/16/2012  . Unspecified constipation 05/16/2012  . Low back pain 05/15/2012  . Neck pain 05/15/2012  . Hypokalemia 05/14/2012  . Abdominal pain 05/14/2012  . HTN (hypertension) 05/14/2012  . Hyperlipemia      PLAN:  1. I personally  reviewed and went over laboratory results with the patient. 2. Patient education regarding peripheral neuropathy 3. Patient education regarding Paclitaxel chemotherapy 4. Rx for Lotrisone cream to be applied BID to affected area, escribed 5. Long discussion regarding bowel regimen. 6. CT restaging scan scheduled for 07/31/2012. 7. Move on to cycle 3 of chemotherapy as scheduled on 07/18/2012. 8. Follow-up appointment with Dr. Duard Brady on 08/16/2012 9. Return as scheduled on 08/16/2012   All questions were answered. The patient knows to call the clinic with any problems, questions or concerns. We can certainly see the patient much sooner if necessary.  The patient and plan discussed with [name], MD and he is in agreement with the aforementioned.  I spent 30 minutes counseling the patient face to face. The total time spent in the appointment was 40 minutes.  Sundiata Ferrick

## 2012-07-12 ENCOUNTER — Ambulatory Visit: Payer: Medicare Other | Admitting: Gynecologic Oncology

## 2012-07-12 ENCOUNTER — Telehealth (HOSPITAL_COMMUNITY): Payer: Self-pay | Admitting: Dietician

## 2012-07-12 NOTE — Telephone Encounter (Signed)
Nutrition Note  Pt identified for weight loss on [facility] nutrition screen.   Wt Readings from Last 10 Encounters:  07/10/12 177 lb 6.4 oz (80.468 kg)  06/25/12 186 lb (84.369 kg)  06/25/12 186 lb (84.369 kg)  06/21/12 186 lb (84.369 kg)  06/19/12 186 lb (84.369 kg)  06/18/12 187 lb 4.8 oz (84.959 kg)  06/07/12 195 lb 8.8 oz (88.7 kg)  06/07/12 195 lb 8.8 oz (88.7 kg)  06/01/12 198 lb 6.4 oz (89.994 kg)  05/24/12 196 lb 6.4 oz (89.086 kg)   Chart reviewed. Pt with stage II ovarian cancer with mets to omentum. Currently undergoing chemotherapy.  Called and spoke to pt at 1613. Pt reports fair, but improving appetite as of late. She reports she struggles to most with taste changes, as most fruits leave a metallic taste in her mouth. She reports struggling with constipation and diarrhea with both cycles of chemo. She denies chewing or swallowing difficulties.  She has a very supportive family who stays with her and helps with meal preparation. Pt eats 3 meals per day (Breakfast: oatmeal OR egg, toast, coffee; Lunch: ham sandwich and homemade strawberry shortcake; Dinner: hamburger). She drinks mainly water, tea, and soda.She reports prior to her diagnosis she ate a lot of frozen, prepared items, such as Hilda Lias Callender's meals.  Wt hx reveals an 18# (10%) wt loss x 1 month. Pt reports she was unaware for this wt change and wt changes was unintentional. She denies recent wt loss due to fluid (pt with hx of ascites). She reports she is unable to fit into her her clothes and has gone down from a size 18 to 14 since treatment. She reports "I know my weight loss is due to the chemo".  Pt meets criteria for severe malnutrition in the context of chronic illness r/t <75% estimated nutritional needs consumed x 1 month and 10% wt loss x 1 month.  Reviewed diet recall with patient and discussed ways to increase calories and protein in diet. Discussed taking advantage of times when appetite is good and  liberalizing diet, especially in light of poor appetite. Discussed high protein, high calorie diet. Also recommended adding a nutritional supplement, such as Ensure or equivalent, to diet to increase calories and protein.  Pt very receptive to information and grateful for call.  Will continue to follow.   Melody Haver, RD, LDN Pager: [phone]

## 2012-07-13 ENCOUNTER — Telehealth (HOSPITAL_COMMUNITY): Payer: Self-pay

## 2012-07-13 NOTE — Telephone Encounter (Signed)
Call received from daughter, Regino Schultze stating that her mom still has not had BM.  Call made to patient and states "I've been going to the [facility] since about 4am.  I haven't talked with my daughter so she doesn't know."  Denies any discomfort at present and will call if has any concerns.

## 2012-07-18 ENCOUNTER — Encounter (HOSPITAL_BASED_OUTPATIENT_CLINIC_OR_DEPARTMENT_OTHER): Payer: Medicare Other

## 2012-07-18 ENCOUNTER — Other Ambulatory Visit (HOSPITAL_COMMUNITY): Payer: Self-pay | Admitting: Oncology

## 2012-07-18 VITALS — BP 132/67 | HR 98 | Temp 98.8°F | Resp 16 | Wt 179.4 lb

## 2012-07-18 DIAGNOSIS — N9489 Other specified conditions associated with female genital organs and menstrual cycle: Secondary | ICD-10-CM

## 2012-07-18 DIAGNOSIS — C569 Malignant neoplasm of unspecified ovary: Secondary | ICD-10-CM

## 2012-07-18 DIAGNOSIS — Z5111 Encounter for antineoplastic chemotherapy: Secondary | ICD-10-CM

## 2012-07-18 DIAGNOSIS — C786 Secondary malignant neoplasm of retroperitoneum and peritoneum: Secondary | ICD-10-CM

## 2012-07-18 LAB — CBC WITH DIFFERENTIAL/PLATELET
HCT: 37.5 % (ref 36.0–46.0)
Hemoglobin: 12.5 g/dL (ref 12.0–15.0)
Lymphocytes Relative: 17 % (ref 12–46)
Monocytes Absolute: 0.1 10*3/uL (ref 0.1–1.0)
Monocytes Relative: 1 % — ABNORMAL LOW (ref 3–12)
Neutro Abs: 5.8 10*3/uL (ref 1.7–7.7)
RBC: 4.55 MIL/uL (ref 3.87–5.11)
WBC: 7.1 10*3/uL (ref 4.0–10.5)

## 2012-07-18 LAB — COMPREHENSIVE METABOLIC PANEL
AST: 26 U/L (ref 0–37)
Alkaline Phosphatase: 129 U/L — ABNORMAL HIGH (ref 39–117)
BUN: 11 mg/dL (ref 6–23)
CO2: 22 mEq/L (ref 19–32)
Chloride: 103 mEq/L (ref 96–112)
Creatinine, Ser: 0.57 mg/dL (ref 0.50–1.10)
GFR calc non Af Amer: >90 mL/min (ref >90)
Total Bilirubin: 0.3 mg/dL (ref 0.3–1.2)

## 2012-07-18 MED ORDER — DIPHENHYDRAMINE HCL 50 MG/ML IJ SOLN
50.0000 mg | Freq: Once | INTRAMUSCULAR | Status: AC
  Administered 2012-07-18: 50 mg via INTRAVENOUS

## 2012-07-18 MED ORDER — HEPARIN SOD (PORK) LOCK FLUSH 100 UNIT/ML IV SOLN
500.0000 [IU] | Freq: Once | INTRAVENOUS | Status: AC | PRN
  Administered 2012-07-18: 500 [IU]
  Filled 2012-07-18: qty 5

## 2012-07-18 MED ORDER — SODIUM CHLORIDE 0.9 % IV SOLN
Freq: Once | INTRAVENOUS | Status: AC
  Administered 2012-07-18: 10:00:00 via INTRAVENOUS

## 2012-07-18 MED ORDER — OXYCODONE-ACETAMINOPHEN 5-325 MG PO TABS: 1.0000 | ORAL_TABLET | ORAL | Status: DC | PRN

## 2012-07-18 MED ORDER — HEPARIN SOD (PORK) LOCK FLUSH 100 UNIT/ML IV SOLN
INTRAVENOUS | Status: AC
  Filled 2012-07-18: qty 5

## 2012-07-18 MED ORDER — DIPHENHYDRAMINE HCL 50 MG/ML IJ SOLN
INTRAMUSCULAR | Status: AC
  Filled 2012-07-18: qty 1

## 2012-07-18 MED ORDER — PACLITAXEL CHEMO INJECTION 300 MG/50ML
175.0000 mg/m2 | Freq: Once | INTRAVENOUS | Status: AC
  Administered 2012-07-18: 342 mg via INTRAVENOUS
  Filled 2012-07-18: qty 57

## 2012-07-18 MED ORDER — SODIUM CHLORIDE 0.9 % IV SOLN
Freq: Once | INTRAVENOUS | Status: AC
  Administered 2012-07-18: 16 mg via INTRAVENOUS
  Filled 2012-07-18: qty 8

## 2012-07-18 MED ORDER — FAMOTIDINE IN NACL 20-0.9 MG/50ML-% IV SOLN
20.0000 mg | Freq: Once | INTRAVENOUS | Status: AC
  Administered 2012-07-18: 20 mg via INTRAVENOUS

## 2012-07-18 MED ORDER — SODIUM CHLORIDE 0.9 % IV SOLN
650.0000 mg | Freq: Once | INTRAVENOUS | Status: AC
  Administered 2012-07-18: 650 mg via INTRAVENOUS
  Filled 2012-07-18: qty 65

## 2012-07-18 MED ORDER — SODIUM CHLORIDE 0.9 % IJ SOLN
10.0000 mL | INTRAMUSCULAR | Status: DC | PRN
  Filled 2012-07-18: qty 10

## 2012-07-19 ENCOUNTER — Other Ambulatory Visit (HOSPITAL_COMMUNITY): Payer: Self-pay | Admitting: Oncology

## 2012-07-19 ENCOUNTER — Inpatient Hospital Stay (HOSPITAL_COMMUNITY): Payer: Medicare Other

## 2012-07-19 DIAGNOSIS — C569 Malignant neoplasm of unspecified ovary: Secondary | ICD-10-CM

## 2012-07-19 MED ORDER — LORAZEPAM 0.5 MG PO TABS: 0.5000 mg | ORAL_TABLET | ORAL | Status: DC | PRN

## 2012-07-20 ENCOUNTER — Encounter (HOSPITAL_BASED_OUTPATIENT_CLINIC_OR_DEPARTMENT_OTHER): Payer: Medicare Other

## 2012-07-20 VITALS — BP 135/79 | HR 86

## 2012-07-20 DIAGNOSIS — N9489 Other specified conditions associated with female genital organs and menstrual cycle: Secondary | ICD-10-CM

## 2012-07-20 DIAGNOSIS — C569 Malignant neoplasm of unspecified ovary: Secondary | ICD-10-CM

## 2012-07-20 DIAGNOSIS — C786 Secondary malignant neoplasm of retroperitoneum and peritoneum: Secondary | ICD-10-CM

## 2012-07-20 DIAGNOSIS — Z5189 Encounter for other specified aftercare: Secondary | ICD-10-CM

## 2012-07-20 MED ORDER — PEGFILGRASTIM INJECTION 6 MG/0.6ML
6.0000 mg | Freq: Once | SUBCUTANEOUS | Status: AC
  Administered 2012-07-20: 6 mg via SUBCUTANEOUS

## 2012-07-20 MED ORDER — PEGFILGRASTIM INJECTION 6 MG/0.6ML
SUBCUTANEOUS | Status: AC
  Filled 2012-07-20: qty 0.6

## 2012-07-20 NOTE — Progress Notes (Signed)
Tolerated Neulasta 6 mg injection well.sub-q to lower right abd.

## 2012-07-31 ENCOUNTER — Ambulatory Visit (HOSPITAL_COMMUNITY)
Admission: RE | Admit: 2012-07-31 | Discharge: 2012-07-31 | Disposition: A | Discharge status: Home / Self Care | Payer: Medicare Other | Source: Ambulatory Visit | Attending: Oncology | Admitting: Oncology

## 2012-07-31 DIAGNOSIS — Z9221 Personal history of antineoplastic chemotherapy: Secondary | ICD-10-CM | POA: Insufficient documentation

## 2012-07-31 DIAGNOSIS — C786 Secondary malignant neoplasm of retroperitoneum and peritoneum: Secondary | ICD-10-CM

## 2012-07-31 DIAGNOSIS — Z853 Personal history of malignant neoplasm of breast: Secondary | ICD-10-CM | POA: Insufficient documentation

## 2012-07-31 DIAGNOSIS — C569 Malignant neoplasm of unspecified ovary: Secondary | ICD-10-CM

## 2012-07-31 DIAGNOSIS — R110 Nausea: Secondary | ICD-10-CM | POA: Insufficient documentation

## 2012-07-31 DIAGNOSIS — R109 Unspecified abdominal pain: Secondary | ICD-10-CM | POA: Insufficient documentation

## 2012-07-31 MED ORDER — IOHEXOL 300 MG/ML  SOLN
100.0000 mL | Freq: Once | INTRAMUSCULAR | Status: AC | PRN
  Administered 2012-07-31: 100 mL via INTRAVENOUS

## 2012-08-08 ENCOUNTER — Encounter (HOSPITAL_COMMUNITY): Payer: Medicare Other | Attending: Oncology | Admitting: Oncology

## 2012-08-08 ENCOUNTER — Encounter (HOSPITAL_COMMUNITY): Payer: Self-pay | Admitting: Oncology

## 2012-08-08 VITALS — BP 131/82 | HR 94 | Temp 97.6°F | Resp 16 | Wt 180.3 lb

## 2012-08-08 DIAGNOSIS — E876 Hypokalemia: Secondary | ICD-10-CM | POA: Insufficient documentation

## 2012-08-08 DIAGNOSIS — C801 Malignant (primary) neoplasm, unspecified: Secondary | ICD-10-CM

## 2012-08-08 DIAGNOSIS — C569 Malignant neoplasm of unspecified ovary: Secondary | ICD-10-CM | POA: Insufficient documentation

## 2012-08-08 DIAGNOSIS — R1031 Right lower quadrant pain: Secondary | ICD-10-CM | POA: Insufficient documentation

## 2012-08-08 DIAGNOSIS — C786 Secondary malignant neoplasm of retroperitoneum and peritoneum: Secondary | ICD-10-CM | POA: Insufficient documentation

## 2012-08-08 DIAGNOSIS — C779 Secondary and unspecified malignant neoplasm of lymph node, unspecified: Secondary | ICD-10-CM

## 2012-08-08 DIAGNOSIS — Z853 Personal history of malignant neoplasm of breast: Secondary | ICD-10-CM

## 2012-08-08 DIAGNOSIS — N9489 Other specified conditions associated with female genital organs and menstrual cycle: Secondary | ICD-10-CM | POA: Insufficient documentation

## 2012-08-08 LAB — COMPREHENSIVE METABOLIC PANEL
AST: 23 U/L (ref 0–37)
Albumin: 4.1 g/dL (ref 3.5–5.2)
BUN: 12 mg/dL (ref 6–23)
Chloride: 102 mEq/L (ref 96–112)
Creatinine, Ser: 0.74 mg/dL (ref 0.50–1.10)
Potassium: 4.1 mEq/L (ref 3.5–5.1)
Total Bilirubin: 0.4 mg/dL (ref 0.3–1.2)
Total Protein: 7.3 g/dL (ref 6.0–8.3)

## 2012-08-08 LAB — CBC WITH DIFFERENTIAL/PLATELET
Basophils Relative: 0 % (ref 0–1)
Eosinophils Absolute: 0.1 10*3/uL (ref 0.0–0.7)
Eosinophils Relative: 1 % (ref 0–5)
HCT: 39.6 % (ref 36.0–46.0)
Hemoglobin: 12.8 g/dL (ref 12.0–15.0)
MCH: 28.5 pg (ref 26.0–34.0)
MCHC: 32.3 g/dL (ref 30.0–36.0)
MCV: 88.2 fL (ref 78.0–100.0)
Monocytes Absolute: 0.9 10*3/uL (ref 0.1–1.0)
Monocytes Relative: 9 % (ref 3–12)
Neutro Abs: 5.7 10*3/uL (ref 1.7–7.7)

## 2012-08-08 NOTE — Patient Instructions (Addendum)
[facility] Discharge Instructions  RECOMMENDATIONS MADE BY THE CONSULTANT AND ANY TEST RESULTS WILL BE SENT TO YOUR REFERRING PHYSICIAN.  EXAM FINDINGS BY THE PHYSICIAN TODAY AND SIGNS OR SYMPTOMS TO REPORT TO CLINIC OR PRIMARY PHYSICIAN: Exam and discussion by MD.  Your scans are better.  See Dr. Duard Brady as planned and if she recommends surgery, Dr. Mariel Sleet feels this would be ok.e  MEDICATIONS PRESCRIBED:  None  INSTRUCTIONS GIVEN AND DISCUSSED: Report fevers, chills, shortness of breath or other problems.  SPECIAL INSTRUCTIONS/FOLLOW-UP: Follow-up in 1 month. Thank you for choosing [facility] to provide your oncology and hematology care.  To afford each patient quality time with our providers, please arrive at least 15 minutes before your scheduled appointment time.  With your help, our goal is to use those 15 minutes to complete the necessary work-up to ensure our physicians have the information they need to help with your evaluation and healthcare recommendations.    Effective January 1st, 2014, we ask that you re-schedule your appointment with our physicians should you arrive 10 or more minutes late for your appointment.  We strive to give you quality time with our providers, and arriving late affects you and other patients whose appointments are after yours.    Again, thank you for choosing [facility].  Our hope is that these requests will decrease the amount of time that you wait before being seen by our physicians.       _____________________________________________________________  Should you have questions after your visit to [facility], please contact our office at [phone] between the hours of 8:30 a.m. and 5:00 p.m.  Voicemails left after 4:30 p.m. will not be returned until the following business day.  For prescription refill requests, have your pharmacy contact our office with your prescription refill  request.

## 2012-08-08 NOTE — Progress Notes (Signed)
Rita Humphrey presented for labwork. Labs per MD order drawn via Peripheral Line 23 gauge needle inserted in right AC  Good blood return present. Procedure without incident.  Needle removed intact. Patient tolerated procedure well.

## 2012-08-08 NOTE — Progress Notes (Signed)
#  1 advanced ovarian cancer versus primary peritoneal carcinomatosis. She is now status post 3 cycles of chemotherapy the first with carboplatin and Taxotere the most recent 2 cycles of carboplatin and Taxol. She is due for cycle 4 tomorrow. She's had a tremendous response to therapy with nice improvement in her CAT scan as well as tremendous reduction in her CA 125 level. She feels much improved. She still has some postoperative left lower to mid abdominal discomfort is aching in nature and not really getting worse by any means. Bowel function is improving urinary function is fine. She's had no fevers chills or night sweats.  #2 history of bilateral breast cancers. She needs BRCA1 and BRCA2 testing but does not feel she can afford genetic testing at this point. I discussed this with her again today about its importance.  She is doing very very well. Whether she is a candidate for site reductive surgery in the future will be determined by Dr. Denman George a violation next week. There are 2 options one is to reoperation on her to see if she is able to be helped by surgical reduction now versus completing 6 full cycles of chemotherapy and then just observing her. If she were to be operated on now I think she would be a candidate for 3 more cycles of chemotherapy postoperatively.  Her abdomen is soft, nondistended, tender only just to the left of the incision. This is direct tenderness only.. There is no obvious rebound. Bowel sounds are present. She has no hepatosplenomegaly. She has no obvious masses. She has no obvious inguinal nodes, cervical nodes, supraclavicular nodes, infraclavicular nodes or axillary nodes. Port-A-Cath is intact. Her lungs are clear to auscultation and percussion. Heart shows a regular rhythm and rate without murmur rub or gallop. She has no leg or arm edema.  Her CAT scan shows disappearance of the pleural effusion, tremendous decrease in the ascites and ovarian masses.  She will keep  her appointment with Dr. Duard Brady next week and we will proceed with cycle 4 tomorrow. She will return to see [place] for a clinic visit in a month. I went over her CAT scans with her and her daughters in detail, showed them the improvements with which they were delighted.

## 2012-08-09 ENCOUNTER — Encounter (HOSPITAL_BASED_OUTPATIENT_CLINIC_OR_DEPARTMENT_OTHER): Payer: Medicare Other

## 2012-08-09 DIAGNOSIS — N9489 Other specified conditions associated with female genital organs and menstrual cycle: Secondary | ICD-10-CM

## 2012-08-09 DIAGNOSIS — C569 Malignant neoplasm of unspecified ovary: Secondary | ICD-10-CM

## 2012-08-09 DIAGNOSIS — C786 Secondary malignant neoplasm of retroperitoneum and peritoneum: Secondary | ICD-10-CM

## 2012-08-09 DIAGNOSIS — Z5111 Encounter for antineoplastic chemotherapy: Secondary | ICD-10-CM

## 2012-08-09 LAB — CA 125: CA 125: 54.9 U/mL — ABNORMAL HIGH (ref 0.0–30.2)

## 2012-08-09 MED ORDER — FAMOTIDINE IN NACL 20-0.9 MG/50ML-% IV SOLN
20.0000 mg | Freq: Once | INTRAVENOUS | Status: AC
  Administered 2012-08-09: 20 mg via INTRAVENOUS

## 2012-08-09 MED ORDER — SODIUM CHLORIDE 0.9 % IV SOLN
580.0000 mg | Freq: Once | INTRAVENOUS | Status: AC
  Administered 2012-08-09: 580 mg via INTRAVENOUS
  Filled 2012-08-09: qty 58

## 2012-08-09 MED ORDER — PACLITAXEL CHEMO INJECTION 300 MG/50ML
175.0000 mg/m2 | Freq: Once | INTRAVENOUS | Status: AC
  Administered 2012-08-09: 342 mg via INTRAVENOUS
  Filled 2012-08-09: qty 57

## 2012-08-09 MED ORDER — SODIUM CHLORIDE 0.9 % IJ SOLN
10.0000 mL | INTRAMUSCULAR | Status: DC | PRN
  Filled 2012-08-09: qty 10

## 2012-08-09 MED ORDER — HEPARIN SOD (PORK) LOCK FLUSH 100 UNIT/ML IV SOLN
INTRAVENOUS | Status: AC
  Filled 2012-08-09: qty 5

## 2012-08-09 MED ORDER — SODIUM CHLORIDE 0.9 % IV SOLN
Freq: Once | INTRAVENOUS | Status: AC
  Administered 2012-08-09: 09:00:00 via INTRAVENOUS

## 2012-08-09 MED ORDER — FAMOTIDINE IN NACL 20-0.9 MG/50ML-% IV SOLN
INTRAVENOUS | Status: AC
  Filled 2012-08-09: qty 50

## 2012-08-09 MED ORDER — SODIUM CHLORIDE 0.9 % IV SOLN
Freq: Once | INTRAVENOUS | Status: AC
  Administered 2012-08-09: 16 mg via INTRAVENOUS
  Filled 2012-08-09: qty 8

## 2012-08-09 MED ORDER — DIPHENHYDRAMINE HCL 50 MG/ML IJ SOLN
50.0000 mg | Freq: Once | INTRAMUSCULAR | Status: AC
  Administered 2012-08-09: 50 mg via INTRAVENOUS

## 2012-08-09 MED ORDER — DIPHENHYDRAMINE HCL 50 MG/ML IJ SOLN
INTRAMUSCULAR | Status: AC
  Filled 2012-08-09: qty 1

## 2012-08-09 MED ORDER — HEPARIN SOD (PORK) LOCK FLUSH 100 UNIT/ML IV SOLN
500.0000 [IU] | Freq: Once | INTRAVENOUS | Status: AC | PRN
  Administered 2012-08-09: 500 [IU]
  Filled 2012-08-09: qty 5

## 2012-08-10 ENCOUNTER — Encounter (HOSPITAL_BASED_OUTPATIENT_CLINIC_OR_DEPARTMENT_OTHER): Payer: Medicare Other

## 2012-08-10 VITALS — BP 150/82 | HR 93 | Temp 97.9°F | Resp 18

## 2012-08-10 DIAGNOSIS — C786 Secondary malignant neoplasm of retroperitoneum and peritoneum: Secondary | ICD-10-CM

## 2012-08-10 DIAGNOSIS — C569 Malignant neoplasm of unspecified ovary: Secondary | ICD-10-CM

## 2012-08-10 DIAGNOSIS — N9489 Other specified conditions associated with female genital organs and menstrual cycle: Secondary | ICD-10-CM

## 2012-08-10 MED ORDER — PEGFILGRASTIM INJECTION 6 MG/0.6ML
6.0000 mg | Freq: Once | SUBCUTANEOUS | Status: AC
  Administered 2012-08-10: 6 mg via SUBCUTANEOUS

## 2012-08-10 MED ORDER — PEGFILGRASTIM INJECTION 6 MG/0.6ML
SUBCUTANEOUS | Status: AC
  Filled 2012-08-10: qty 0.6

## 2012-08-10 NOTE — Progress Notes (Signed)
Rita Humphrey presents today for injection per MD orders. Neulasta 6mg  administered SQ in right Abdomen. Administration without incident. Patient tolerated well. No problems after chemotherapy yesterday.

## 2012-08-16 ENCOUNTER — Encounter: Payer: Self-pay | Admitting: Gynecologic Oncology

## 2012-08-16 ENCOUNTER — Telehealth (HOSPITAL_COMMUNITY): Payer: Self-pay | Admitting: *Deleted

## 2012-08-16 ENCOUNTER — Ambulatory Visit: Payer: Medicare Other | Attending: Gynecologic Oncology | Admitting: Gynecologic Oncology

## 2012-08-16 VITALS — BP 136/80 | HR 104 | Temp 98.4°F | Resp 18 | Ht 63.0 in | Wt 179.0 lb

## 2012-08-16 DIAGNOSIS — I10 Essential (primary) hypertension: Secondary | ICD-10-CM | POA: Insufficient documentation

## 2012-08-16 DIAGNOSIS — B009 Herpesviral infection, unspecified: Secondary | ICD-10-CM | POA: Insufficient documentation

## 2012-08-16 DIAGNOSIS — Z90710 Acquired absence of both cervix and uterus: Secondary | ICD-10-CM | POA: Insufficient documentation

## 2012-08-16 DIAGNOSIS — R5383 Other fatigue: Secondary | ICD-10-CM | POA: Insufficient documentation

## 2012-08-16 DIAGNOSIS — Z86718 Personal history of other venous thrombosis and embolism: Secondary | ICD-10-CM | POA: Insufficient documentation

## 2012-08-16 DIAGNOSIS — R197 Diarrhea, unspecified: Secondary | ICD-10-CM | POA: Insufficient documentation

## 2012-08-16 DIAGNOSIS — E785 Hyperlipidemia, unspecified: Secondary | ICD-10-CM | POA: Insufficient documentation

## 2012-08-16 DIAGNOSIS — C569 Malignant neoplasm of unspecified ovary: Secondary | ICD-10-CM | POA: Insufficient documentation

## 2012-08-16 DIAGNOSIS — D3000 Benign neoplasm of unspecified kidney: Secondary | ICD-10-CM | POA: Insufficient documentation

## 2012-08-16 DIAGNOSIS — R5381 Other malaise: Secondary | ICD-10-CM | POA: Insufficient documentation

## 2012-08-16 DIAGNOSIS — R110 Nausea: Secondary | ICD-10-CM | POA: Insufficient documentation

## 2012-08-16 DIAGNOSIS — Z853 Personal history of malignant neoplasm of breast: Secondary | ICD-10-CM | POA: Insufficient documentation

## 2012-08-16 DIAGNOSIS — E079 Disorder of thyroid, unspecified: Secondary | ICD-10-CM | POA: Insufficient documentation

## 2012-08-16 DIAGNOSIS — Z79899 Other long term (current) drug therapy: Secondary | ICD-10-CM | POA: Insufficient documentation

## 2012-08-16 NOTE — Telephone Encounter (Signed)
Carlean called and states that no surgery to be done until chemo finished per Dr. Duard Brady. I scheduled cycle 5 and 6.

## 2012-08-16 NOTE — Progress Notes (Signed)
Consult Note: Gyn-Onc  Rita Humphrey 72 y.o. female  CC:  Chief Complaint  Patient presents with  . Ovarian Cancer    Follow up    HPI:  Patient is a 72 year old gravida 3 para 3 who has been experiencing abdominal pain since November of 2013. Since December she has lost approximately 20 pounds and during that time is also noted early satiety and not be able to wear her close. She did not seek medical attention until February of this year. She describes her pain is staining pain it has increased since it started in November. She underwent a CT scan of the abdomen and pelvis on March 10. It reveals a moderate amount of ascites or peritoneal cavity. There are multiple soft tissue lesions noted throughout the peritoneal cavity consistent with peritoneal implants of tumor with omental caking as well. The abdominal structures are unremarkable. The pelvis she has ascites as well as multiple peritoneal implants of tumor. The prominent adnexal soft tissue masses bilaterally. On the left a soft tissue mass in the left adnexa measures 7 x 4 cm with a right adnexal soft tissue mass measuring 3.8 x 2.4 cm. She underwent a paracentesis on March 11. Pathology revealed malignant cells consistent with carcinoma. The malignant cells are positive for cytokeratin 7 and show a morphologic and immunophenotypic finding consistent with a gynecologic primary. CA 125 has been drawn that was over 1000.  The patient has a personal history of breast cancer. In 1993 she was diagnosed with a right-sided breast cancer. She was treated with mastectomy on the right and chemotherapy including doxorubicin. She does not require any radiation treatment. She reports she has limited followup. In 2003 show left-sided breast cancer that was early stage. She was treated with a left mastectomy and did not receive any adjuvant chemotherapy or radiation.  She never had any genetic testing.  She underwent expiratory laparotomy and peritoneal  biopsies on April 1. Operative findings included 3.7 L of ascites. 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 She complete coverage of the right hemidiaphragm a tumor. The transverse colon the colon and omentum were diffusely replaced with tumor that was not resectable. The left upper quadrant she agglutination tumor with about that had the appearance of the mass although there is no large abdominal pelvic mass in the left upper quadrant. There is diffuse subcentimeter disease coding almost the entire surface of the small bowel mesentery. The right ovary was agglutinated attached to the rectosigmoid colon with a thick rind of tumor connecting it. She was not deemed resectable. A biopsy was performed it revealed high-grade carcinoma. 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 She underwent 1 cycle of Taxotere and carboplatin based chemotherapy in the hospital.   After 3 cycles of chemotherapy her CA 125 went from 1097 to 54.9 on June 4. On May 27th she had a CT scan that revealed:  Findings: Lung bases: Bilateral mastectomy. Clear lung bases. Normal heart size without pericardial or pleural effusion.  Abdomen/pelvis: Normal liver, spleen, stomach, pancreas, gallbladder, biliary tract, adrenal glands. Left renal angiomyolipoma of 9 mm. Bilateral renal sinus cysts, without hydronephrosis. No retroperitoneal or retrocrural adenopathy. Sigmoid diverticulosis with muscular hypertrophy. Normal terminal ileum. Normal caliber of small bowel loops. Nearly completely resolved abdominal ascites. There is minimal fluid identified adjacent the spleen on image 25/series 2 and along the right lobe  of the liver on image 37/series 2. Slight decrease in omental nodularity/caking. This  is ill-defined, but measures on the order of 8.0 x 3.6 cm on image 45/series 2. No pelvic adenopathy. Normal urinary bladder. Similar cul-de-sac small volume  fluid, minimally eccentric left. Left adnexal mass measures 4.2 x 2.7 cm versus 7.0 x 4.0 cm on the prior. Right adnexal mass of 3.5 x 2.5 cm compares with 3.8 x 2.4 cm on the prior.  Bones/Musculoskeletal: Mild osteopenia. L4-S1 disc bulges.  IMPRESSION:  1. Overall response to therapy, with decreased omental/peritoneal disease and nearly completely resolved ascites.  2. Decreased size of bilateral ovarian/adnexal masses.  3. Similar small volume cul-de-sac fluid.  4. Similar left renal angiomyolipoma.  She had cycle #4 of chemotherapy on June 5. She's undergone the subsequent 3 cycles with carboplatin and Taxol. Her first cycle of chemotherapy was with carboplatin and Taxotere. They had discussed the results of the CT scan with Dr. Mariel Sleet. She comes in to discuss the results and determine the plan for interval debulking.  Review of Systems: She said this last cycle of chemotherapy is still more difficult. She's had more nausea this cycle and is taking Zofran once or twice a day. She is feeling some chills has had some diarrhea this week. 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 In many years she's had a fever blister. She states that she has more neuropathy in her legs is mostly pain and weakness she states that her oncologist is aware. She denies a change about bladder habits other than having some diarrhea this week. She does feel like she has a viral illness. She does feel cold and has been afebrile. She denies any chest pain, shortness of breath. She denies any significant unintentional weight loss or weight gain.  Current Meds:  Outpatient Encounter Prescriptions as of 08/16/2012  Medication Sig Dispense Refill  . Alum & Mag Hydroxide-Simeth (MAGIC MOUTHWASH W/LIDOCAINE) SOLN Take 5 mLs by mouth 4 (four) times daily as needed.  240 mL  1  .  amLODipine (NORVASC) 10 MG tablet Take 10 mg by mouth daily before breakfast.       . dexamethasone (DECADRON) 4 MG tablet 8mg  PO at 9pm before chemo, 8mg  at 3am the AM of chemo. Day after chemo, take 8mg  BIDx 2 days. Then Stop. Repeat with each cycle.  30 tablet  1  . docusate sodium (COLACE) 100 MG capsule Take 100 mg by mouth 2 (two) times daily. Taking 2 at bedtime      . lidocaine-prilocaine (EMLA) cream Apply topically as needed. Apply a quarter size amount to port site 1 hour prior to chemo. Do not rub in. Cover with plastic.  30 g  PRN  . LORazepam (ATIVAN) 0.5 MG tablet Take 1 tablet (0.5 mg total) by mouth every 3 (three) hours as needed for anxiety.  45 tablet  3  . magnesium hydroxide (MILK OF MAGNESIA) 400 MG/5ML suspension Take 30 mLs by mouth daily as needed for constipation.      Marland Kitchen nystatin (MYCOSTATIN) powder Apply topically 3 (three) times daily.  30 g  1  . ondansetron (ZOFRAN) 8 MG tablet Starting the day after chemo, take 1 tablet two times a day for 2 days. Then may take 1 tablet every 8 hours if needed for nausea/vomiting.  30 tablet  3  . oxyCODONE-acetaminophen (PERCOCET/ROXICET) 5-325 MG per tablet Take 1-2 tablets by mouth every  4 (four) hours as needed.  60 tablet  0  . potassium chloride SA (K-DUR,KLOR-CON) 20 MEQ tablet Take 1 tablet (20 mEq total) by mouth daily.  30 tablet  2  . pravastatin (PRAVACHOL) 20 MG tablet Take 20 mg by mouth at bedtime.      . clotrimazole-betamethasone (LOTRISONE) cream Apply topically 2 (two) times daily.  30 g  0  . hydrochlorothiazide (HYDRODIURIL) 25 MG tablet Take 25 mg by mouth daily.        No facility-administered encounter medications on file as of 08/16/2012.    Allergy:  Allergies  Allergen Reactions  . Penicillins Anaphylaxis and Nausea And Vomiting  . Morphine And Related Nausea And Vomiting    Social Hx:   History   Social History  . Marital Status: Widowed    Spouse Name: N/A    Number of Children: N/A  . Years  of Education: N/A   Occupational History  . Not on file.   Social History Main Topics  . Smoking status: Never Smoker   . Smokeless tobacco: Never Used  . Alcohol Use: No  . Drug Use: No  . Sexually Active: No   Other Topics Concern  . Not on file   Social History Narrative  . No narrative on file    Past Surgical Hx: Breast cancer surgery in 1993 and 2003. Bladder tacking in 1982 Past Surgical History  Procedure Laterality Date  . Thyroid lobectomy    . Foot surgery      2 spurs taken out of foot  . Varicose vein surgery    . Bladder tack surgery     . Tonsillectomy    . Mastectomy      bilateral   . Laparotomy Bilateral 06/05/2012    Procedure: EXPLORATORY LAPAROTOMY WITH PELVIC TUMOR BIOPSY;  Surgeon: Rejeana Brock A. Duard Brady, MD;  Location: [facility];  Service: Gynecology;  Laterality: Bilateral;  . Abdominal hysterectomy  1980  . Vascular surgery      veins stripped from both legs  . Portacath placement Left 06/25/2012    Procedure: INSERTION PORT-A-CATH;  Surgeon: Dalia Heading, MD;  Location: [facility];  Service: General;  Laterality: Left;    Past Medical Hx:  Past Medical History  Diagnosis Date  . Hypertension   . Hyperlipemia   . Thyroid disease   . Asthma   . Cancer     [place]  . Breast cancer     s/p bilateral mastectomy [id number]  . Ovarian cancer 05/17/2012  . 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 Adnexal mass 05/16/2012  . Shortness of breath     with exertion   . Peripheral vascular disease     hx of DVT   . Kidney stones     x 2  . 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 She never underwent any genetic testing that she is aware of. Family History  Problem Relation Age of Onset  . Cancer Mother     throat cancer  . Hypertension Father   . Heart attack Brother     5 brothers  . Hypertension Sister     Vitals:  Blood pressure 136/80, pulse 104, temperature 98.4 F (36.9 C), resp.  rate 18, height 5\' 3"  (1.6 m), weight 179 lb (81.194 kg).  Physical Exam: Well-nourished well-developed female in no acute distress.  HEENT: Fever blister of left-sided lower  lip.  Neck: Supple, no lymphadenopathy, no thyromegaly.  Cardiovascular: Regular rate and rhythm.  Lungs: Clear to auscultation bilaterally  Incision is well-healed. There is no fluid wave. Abdomen is soft his minimally tender to deep palpation in the right lower quadrant. There is no hepatosplenomegaly.  Groins: No lymphadenopathy.  Extremities 1+ nonpitting edema equal bilaterally.  Pelvic: Bimanual examination reveals no masses or nodularity. There is tenderness in the right lower quadrant but no distinct masses appreciated.  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 In the setting of her having breast cancer twice, I feel that this may be related to a BRCA mutation. The patient will require postoperative genetic testing as she has daughters. She does not feel that she could afford that at this time.  She appears to be having a nice response to chemotherapy. However, as a large part of her disease with small a high-volume disease that was in the mesentery believe it would be most prudent to complete her 6 cycles of chemotherapy and then see her with a CT scan after that to determine debulking at that time. Should she have persistent disease after 6 cycles even if she is optimally debulk she and her family understand that she may need additional chemotherapy after surgery. I will contact Dr. Mariel Sleet today to make sure he can get her set up for cycle #5 of chemotherapy. We'll see her after cycle #6 with imaging done just prior to her visit with [place]. Their questions were elicited in answer to their satisfaction.  Allure Greaser A., MD 08/16/2012, 12:23 PM

## 2012-08-22 ENCOUNTER — Other Ambulatory Visit (HOSPITAL_COMMUNITY): Payer: Self-pay | Admitting: Oncology

## 2012-08-22 DIAGNOSIS — R112 Nausea with vomiting, unspecified: Secondary | ICD-10-CM

## 2012-08-22 DIAGNOSIS — E876 Hypokalemia: Secondary | ICD-10-CM

## 2012-08-22 DIAGNOSIS — C569 Malignant neoplasm of unspecified ovary: Secondary | ICD-10-CM

## 2012-08-22 MED ORDER — POTASSIUM CHLORIDE CRYS ER 20 MEQ PO TBCR: 20.0000 meq | EXTENDED_RELEASE_TABLET | Freq: Every day | ORAL | Status: DC

## 2012-08-22 MED ORDER — ONDANSETRON HCL 8 MG PO TABS: ORAL_TABLET | ORAL | Status: DC

## 2012-08-27 ENCOUNTER — Other Ambulatory Visit (HOSPITAL_COMMUNITY): Payer: Self-pay | Admitting: Oncology

## 2012-08-27 DIAGNOSIS — C786 Secondary malignant neoplasm of retroperitoneum and peritoneum: Secondary | ICD-10-CM

## 2012-08-27 DIAGNOSIS — C569 Malignant neoplasm of unspecified ovary: Secondary | ICD-10-CM

## 2012-08-30 ENCOUNTER — Encounter (HOSPITAL_COMMUNITY): Payer: Self-pay | Admitting: Oncology

## 2012-08-30 ENCOUNTER — Encounter (HOSPITAL_BASED_OUTPATIENT_CLINIC_OR_DEPARTMENT_OTHER): Payer: Medicare Other | Admitting: Oncology

## 2012-08-30 ENCOUNTER — Ambulatory Visit (HOSPITAL_COMMUNITY)
Admission: RE | Admit: 2012-08-30 | Discharge: 2012-08-30 | Disposition: A | Discharge status: Home / Self Care | Payer: Medicare Other | Source: Ambulatory Visit | Attending: Oncology | Admitting: Oncology

## 2012-08-30 ENCOUNTER — Encounter (HOSPITAL_BASED_OUTPATIENT_CLINIC_OR_DEPARTMENT_OTHER): Payer: Medicare Other

## 2012-08-30 ENCOUNTER — Other Ambulatory Visit (HOSPITAL_COMMUNITY): Payer: Self-pay | Admitting: Oncology

## 2012-08-30 VITALS — BP 135/72 | HR 98 | Temp 98.0°F | Resp 18 | Wt 181.0 lb

## 2012-08-30 DIAGNOSIS — G5690 Unspecified mononeuropathy of unspecified upper limb: Secondary | ICD-10-CM

## 2012-08-30 DIAGNOSIS — R1031 Right lower quadrant pain: Secondary | ICD-10-CM

## 2012-08-30 DIAGNOSIS — C569 Malignant neoplasm of unspecified ovary: Secondary | ICD-10-CM

## 2012-08-30 DIAGNOSIS — K5730 Diverticulosis of large intestine without perforation or abscess without bleeding: Secondary | ICD-10-CM | POA: Insufficient documentation

## 2012-08-30 DIAGNOSIS — Z5111 Encounter for antineoplastic chemotherapy: Secondary | ICD-10-CM

## 2012-08-30 DIAGNOSIS — N9489 Other specified conditions associated with female genital organs and menstrual cycle: Secondary | ICD-10-CM

## 2012-08-30 DIAGNOSIS — C786 Secondary malignant neoplasm of retroperitoneum and peritoneum: Secondary | ICD-10-CM

## 2012-08-30 DIAGNOSIS — R109 Unspecified abdominal pain: Secondary | ICD-10-CM

## 2012-08-30 DIAGNOSIS — Q619 Cystic kidney disease, unspecified: Secondary | ICD-10-CM | POA: Insufficient documentation

## 2012-08-30 DIAGNOSIS — IMO0002 Reserved for concepts with insufficient information to code with codable children: Secondary | ICD-10-CM

## 2012-08-30 HISTORY — DX: Reserved for concepts with insufficient information to code with codable children: IMO0002

## 2012-08-30 LAB — CA 125: CA 125: 27.4 U/mL (ref 0.0–30.2)

## 2012-08-30 LAB — COMPREHENSIVE METABOLIC PANEL
Albumin: 3.5 g/dL (ref 3.5–5.2)
Alkaline Phosphatase: 143 U/L — ABNORMAL HIGH (ref 39–117)
BUN: 17 mg/dL (ref 6–23)
CO2: 25 mEq/L (ref 19–32)
Chloride: 101 mEq/L (ref 96–112)
Creatinine, Ser: 0.63 mg/dL (ref 0.50–1.10)
GFR calc Af Amer: >90 mL/min (ref >90)
GFR calc non Af Amer: 87 mL/min — ABNORMAL LOW (ref >90)
Glucose, Bld: 212 mg/dL — ABNORMAL HIGH (ref 70–99)
Potassium: 4.8 mEq/L (ref 3.5–5.1)
Total Bilirubin: 0.3 mg/dL (ref 0.3–1.2)

## 2012-08-30 LAB — CBC WITH DIFFERENTIAL/PLATELET
Basophils Absolute: 0 10*3/uL (ref 0.0–0.1)
Eosinophils Absolute: 0 10*3/uL (ref 0.0–0.7)
Eosinophils Relative: 0 % (ref 0–5)
HCT: 38.8 % (ref 36.0–46.0)
Lymphocytes Relative: 12 % (ref 12–46)
MCH: 30.1 pg (ref 26.0–34.0)
MCV: 90.4 fL (ref 78.0–100.0)
Monocytes Absolute: 0.1 10*3/uL (ref 0.1–1.0)
Platelets: 190 10*3/uL (ref 150–400)
RDW: 23.7 % — ABNORMAL HIGH (ref 11.5–15.5)
WBC: 8 10*3/uL (ref 4.0–10.5)

## 2012-08-30 MED ORDER — FAMOTIDINE IN NACL 20-0.9 MG/50ML-% IV SOLN
20.0000 mg | Freq: Once | INTRAVENOUS | Status: AC
  Administered 2012-08-30: 20 mg via INTRAVENOUS

## 2012-08-30 MED ORDER — SODIUM CHLORIDE 0.9 % IJ SOLN
10.0000 mL | INTRAMUSCULAR | Status: DC | PRN
  Filled 2012-08-30: qty 10

## 2012-08-30 MED ORDER — SODIUM CHLORIDE 0.9 % IV SOLN: 654.5000 mg | Freq: Once | INTRAVENOUS | Status: DC

## 2012-08-30 MED ORDER — SODIUM CHLORIDE 0.9 % IV SOLN
Freq: Once | INTRAVENOUS | Status: AC
  Administered 2012-08-30: 09:00:00 via INTRAVENOUS

## 2012-08-30 MED ORDER — DIPHENHYDRAMINE HCL 50 MG/ML IJ SOLN
INTRAMUSCULAR | Status: AC
  Filled 2012-08-30: qty 1

## 2012-08-30 MED ORDER — PACLITAXEL PROTEIN-BOUND CHEMO INJECTION 100 MG
200.0000 mg/m2 | Freq: Once | INTRAVENOUS | Status: AC
  Administered 2012-08-30: 400 mg via INTRAVENOUS
  Filled 2012-08-30: qty 80

## 2012-08-30 MED ORDER — FAMOTIDINE IN NACL 20-0.9 MG/50ML-% IV SOLN
INTRAVENOUS | Status: AC
  Filled 2012-08-30: qty 50

## 2012-08-30 MED ORDER — DIPHENHYDRAMINE HCL 50 MG/ML IJ SOLN
50.0000 mg | Freq: Once | INTRAMUSCULAR | Status: AC
  Administered 2012-08-30: 50 mg via INTRAVENOUS

## 2012-08-30 MED ORDER — SODIUM CHLORIDE 0.9 % IV SOLN
580.0000 mg | Freq: Once | INTRAVENOUS | Status: AC
  Administered 2012-08-30: 580 mg via INTRAVENOUS
  Filled 2012-08-30: qty 58

## 2012-08-30 MED ORDER — HEPARIN SOD (PORK) LOCK FLUSH 100 UNIT/ML IV SOLN
INTRAVENOUS | Status: AC
  Filled 2012-08-30: qty 5

## 2012-08-30 MED ORDER — SODIUM CHLORIDE 0.9 % IV SOLN
Freq: Once | INTRAVENOUS | Status: AC
  Administered 2012-08-30: 16 mg via INTRAVENOUS
  Filled 2012-08-30: qty 8

## 2012-08-30 MED ORDER — HEPARIN SOD (PORK) LOCK FLUSH 100 UNIT/ML IV SOLN
500.0000 [IU] | Freq: Once | INTRAVENOUS | Status: AC | PRN
  Administered 2012-08-30: 500 [IU]
  Filled 2012-08-30: qty 5

## 2012-08-30 NOTE — Patient Instructions (Addendum)
[facility] Discharge Instructions  RECOMMENDATIONS MADE BY THE CONSULTANT AND ANY TEST RESULTS WILL BE SENT TO YOUR REFERRING PHYSICIAN.  Your chemotherapy was changed today to Abraxane and Carboplatin. You will continue therapy every 21 days. You will need a CT scan of your abdomen before leaving the hospital today. Remember to come tomorrow for your Neulasta injection. Report any issues/concerns to clinic as needed. Keep your appointments as scheduled.  Thank you for choosing [facility] to provide your oncology and hematology care.  To afford each patient quality time with our providers, please arrive at least 15 minutes before your scheduled appointment time.  With your help, our goal is to use those 15 minutes to complete the necessary work-up to ensure our physicians have the information they need to help with your evaluation and healthcare recommendations.    Effective January 1st, 2014, we ask that you re-schedule your appointment with our physicians should you arrive 10 or more minutes late for your appointment.  We strive to give you quality time with our providers, and arriving late affects you and other patients whose appointments are after yours.    Again, thank you for choosing [facility]  Our hope is that these requests will decrease the amount of time that you wait before being seen by our physicians.       _____________________________________________________________  Should you have questions after your visit to [facility], please contact our office at [phone] between the hours of 8:30 a.m. and 5:00 p.m.  Voicemails left after 4:30 p.m. will not be returned until the following business day.  For prescription refill requests, have your pharmacy contact our office with your prescription refill request.

## 2012-08-30 NOTE — Progress Notes (Signed)
The patient is seen as a work in today. She is scheduled for chemotherapy today and reported a nurse some peripheral neuropathy. As a result, I was asked to see the patient today.  Patient is here for cycle 5 of chemotherapy. She reports that the peripheral neuropathy has worsened. It has migrated from her fingertips to encompass all of her fingers. Her palm are spared. Her feet also exhibit peripheral neuropathy and encompasses all of her toes. She also admits that the soles of her feet "feel differently.   She is having difficulty trying and on timing her shoelaces. She is able to button buttons, but exhibits difficulty with this task as well. I tested her finger dexterity and she did have difficulty ascertaining small objects from a flat surface. She correctly identified as a paper clip in her hands with her eyes closed.  She also reports to some abdominal pain. Is located in the right lower quadrant and left upper quadrant. She denies any constipation and reports that she is moving her bowels on a regular basis.  I personally reviewed and went over laboratory results with the patient.  Her labs meet parameters for treatment.  Due to your abdominal discomfort, I will order a abdominal x-ray. That report is negative and only benign findings are noted.  On further discussion with the patient, she reports that she had a colonoscopy last year that revealed diverticulosis. On further discussion she denies any documented fevers, but at home she has felt feverish and has had sweats at home. In the clinic, her temperature is within normal limits.  With the above information, we will change her chemotherapy regimen to carboplatin and Abraxane at 200 mg per meter square. That will begin today. I provided patient education regarding Abraxane including risks, benefits, alternatives, and side effects of therapy. Consent was ascertained for this change in chemotherapy as well.  With her negative plain film of  abdomen, I will pursue a CT of abdomen to evaluate for diverticulitis.  The patient's case is discussed with Dr. [name]. Via telephone and he is in agreement with the aforementioned.  All questions were answered.  KEFALAS,THOMAS  Addendum:  CT scan is negative for diverticulitis or colitis.  No cause for abdominal pain noted.  *RADIOLOGY REPORT*  Clinical Data: Abdominal pain, history diverticulosis, ovarian  cancer with omental metastatic disease, ongoing chemotherapy, past  history bilateral breast cancer post mastectomies, hypertension,  kidney stones  CT ABDOMEN AND PELVIS WITHOUT CONTRAST  Technique: Multidetector CT imaging of the abdomen and pelvis was  performed following the standard protocol without intravenous  contrast. Sagittal and coronal MPR images reconstructed from axial  data set.  Comparison: 07/31/2012  Findings:  Calcified granuloma right lung base image 1.  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  Within limits of a nonenhanced exam, no focal abnormalities of the  liver, spleen, pancreas, or adrenal glands.  Small angiomyolipoma left kidney 10 x 9 mm image 34.  Multiple peripelvic renal cysts bilaterally as noted on prior exam.  Right adnexal mass little change 3.5 x 2.6 cm image 73.  Left adnexal mass slightly decreased 3.5 x 2.8 cm image 74  previously 4.2 x 2.7 cm.  Omental tumor appears minimally changed, 7.9 x 3.3 cm image 43  previously 8.4 x 3.2 cm by my re-measurement on image 45.  Sigmoid diverticulosis without evidence of diverticulitis.  Fluid collection inferior left pelvis 3.7 x 2.7 cm image 77,  previously  3.9 x 3.0 cm.  Unremarkable bladder and ureters.  Uterus surgically absent.  No adenopathy, free fluid, free air, or acute inflammatory process.  No acute osseous findings.  IMPRESSION:  Little interval change in appearance of omental tumor and bilateral   adnexal masses.  Minimal decrease in size of previously seen inferior left pelvic  fluid collection.  Sigmoid diverticulosis without evidence of acute diverticulitis.  Bilateral parapelvic renal cysts.  No definite new intra abdominal or intrapelvic abnormality  identified.  Original Report Authenticated By: Ulyses Southward, M.D.   Patient encouraged to continue with pain medication and to contact the clinic with any changes. No clear etiology of abdominal discomfort at this time.  KEFALAS,THOMAS

## 2012-08-31 ENCOUNTER — Encounter (HOSPITAL_BASED_OUTPATIENT_CLINIC_OR_DEPARTMENT_OTHER): Payer: Medicare Other

## 2012-08-31 VITALS — BP 130/73 | HR 86 | Temp 98.2°F | Resp 18

## 2012-08-31 DIAGNOSIS — C786 Secondary malignant neoplasm of retroperitoneum and peritoneum: Secondary | ICD-10-CM

## 2012-08-31 DIAGNOSIS — N9489 Other specified conditions associated with female genital organs and menstrual cycle: Secondary | ICD-10-CM

## 2012-08-31 DIAGNOSIS — C569 Malignant neoplasm of unspecified ovary: Secondary | ICD-10-CM

## 2012-08-31 DIAGNOSIS — Z5189 Encounter for other specified aftercare: Secondary | ICD-10-CM

## 2012-08-31 MED ORDER — PEGFILGRASTIM INJECTION 6 MG/0.6ML
6.0000 mg | Freq: Once | SUBCUTANEOUS | Status: AC
  Administered 2012-08-31: 6 mg via SUBCUTANEOUS

## 2012-08-31 MED ORDER — PEGFILGRASTIM INJECTION 6 MG/0.6ML
SUBCUTANEOUS | Status: AC
  Filled 2012-08-31: qty 0.6

## 2012-08-31 NOTE — Progress Notes (Signed)
Rita Humphrey presents today for injection per MD orders. Neulasta 6mg  administered SQ in left Abdomen. Administration without incident. Patient tolerated well.

## 2012-09-04 ENCOUNTER — Ambulatory Visit: Payer: Medicare Other | Admitting: Gynecologic Oncology

## 2012-09-06 ENCOUNTER — Other Ambulatory Visit (HOSPITAL_COMMUNITY): Payer: Self-pay | Admitting: Oncology

## 2012-09-06 ENCOUNTER — Telehealth (HOSPITAL_COMMUNITY): Payer: Self-pay | Admitting: *Deleted

## 2012-09-06 ENCOUNTER — Telehealth (HOSPITAL_COMMUNITY): Payer: Self-pay

## 2012-09-06 DIAGNOSIS — IMO0002 Reserved for concepts with insufficient information to code with codable children: Secondary | ICD-10-CM

## 2012-09-06 MED ORDER — GABAPENTIN 300 MG PO CAPS: ORAL_CAPSULE | ORAL | Status: DC

## 2012-09-06 NOTE — Telephone Encounter (Signed)
Ok.  I educated her that the discomfort may worsen over the next few weeks before they improve.  Would she be interested in Gabapentin?

## 2012-09-06 NOTE — Telephone Encounter (Signed)
Pt states that her feet are hurting and hurting worse since this last chemo. She states that her hands are hurting too. She states that the pain in her fingers and feet is like pins and needles. She states that her feet feel very cold to her but when other people touch them - they are not cold. Otherwise doing fine since last chemo.

## 2012-09-06 NOTE — Telephone Encounter (Signed)
     Ok. I educated her that the discomfort may worsen over the next few weeks before they improve. Would she be interested in Gabapentin?        Lennice Sites, RN at 09/06/2012 9:33 AM    Status: Signed             Pt states that her feet are hurting and hurting worse since this last chemo. She states that her hands are hurting too. She states that the pain in her fingers and feet is like pins and needles. She states that her feet feel very cold to her but when other people touch them - they are not cold. Otherwise doing fine since last chemo.        3:30 PM spoke with Afsheen and she is interested in taking Gabapentin and uses [employer].

## 2012-09-11 ENCOUNTER — Encounter (HOSPITAL_COMMUNITY): Payer: Medicare Other | Attending: Oncology

## 2012-09-11 ENCOUNTER — Encounter (HOSPITAL_COMMUNITY): Payer: Self-pay

## 2012-09-11 VITALS — BP 130/78 | HR 99 | Temp 97.9°F | Resp 18 | Wt 185.7 lb

## 2012-09-11 DIAGNOSIS — C786 Secondary malignant neoplasm of retroperitoneum and peritoneum: Secondary | ICD-10-CM | POA: Insufficient documentation

## 2012-09-11 DIAGNOSIS — N9489 Other specified conditions associated with female genital organs and menstrual cycle: Secondary | ICD-10-CM | POA: Insufficient documentation

## 2012-09-11 DIAGNOSIS — C569 Malignant neoplasm of unspecified ovary: Secondary | ICD-10-CM | POA: Insufficient documentation

## 2012-09-11 NOTE — Patient Instructions (Addendum)
[facility] Discharge Instructions  RECOMMENDATIONS MADE BY THE CONSULTANT AND ANY TEST RESULTS WILL BE SENT TO YOUR REFERRING PHYSICIAN.  EXAM FINDINGS BY THE PHYSICIAN TODAY AND SIGNS OR SYMPTOMS TO REPORT TO CLINIC OR PRIMARY PHYSICIAN: Exam and discussion by Dr. Orlie Dakin  MEDICATIONS PRESCRIBED:  Continue Gabapentin 2 at bedtime for a couple of weeks to see if your balance improves.  INSTRUCTIONS GIVEN AND DISCUSSED: Report fevers, chills, uncontrolled nausea, vomiting or other problems.  SPECIAL INSTRUCTIONS/FOLLOW-UP: Call us after you see Dr. Duard Brady so we can get you scheduled for follow-up.  Thank you for choosing [facility] to provide your oncology and hematology care.  To afford each patient quality time with our providers, please arrive at least 15 minutes before your scheduled appointment time.  With your help, our goal is to use those 15 minutes to complete the necessary work-up to ensure our physicians have the information they need to help with your evaluation and healthcare recommendations.    Effective January 1st, 2014, we ask that you re-schedule your appointment with our physicians should you arrive 10 or more minutes late for your appointment.  We strive to give you quality time with our providers, and arriving late affects you and other patients whose appointments are after yours.    Again, thank you for choosing [facility].  Our hope is that these requests will decrease the amount of time that you wait before being seen by our physicians.       _____________________________________________________________  Should you have questions after your visit to [facility], please contact our office at [phone] between the hours of 8:30 a.m. and 5:00 p.m.  Voicemails left after 4:30 p.m. will not be returned until the following business day.  For prescription refill requests, have your pharmacy contact our office  with your prescription refill request.

## 2012-09-11 NOTE — Progress Notes (Signed)
History of present illness  Patient returns to clinic today for routine evaluation. She is tolerating her chemotherapy well without significant side effects. She continues to have a significant peripheral neuropathy has improved since increasing her gabapentin dose to 600 mg at night. Patient states she feels more dizzy since increasing her dose. She admits to dizziness prior to gabapentin, but states it is slightly worse. She otherwise feels well. She has no other neurologic complaints. She has good appetite and denies weight loss. She denies any recent fevers or illnesses. She has no chest pain or shortness of breath. She denies any nausea, vomiting, constipation, or diarrhea. She has no urinary complaints. Patient otherwise feels well and offers no further specific complaints.  Review of systems as per history of present illness.  General:  Well-developed, well-nourished, no acute distress. Mental status: Normal affect. Eyes: Anicteric sclera. Respiratory: Clear to auscultation bilaterally. Cardiovascular: Regular rate and rhythm, no rubs, murmurs, or gallops. Gastrointestinal: Soft, nontender, nondistended, normoactive bowel sounds. Musculoskeletal: No edema. Skin: No rashes or petechiae noted. Neurological: Alert, answering all questions appropriately. Cranial nerves grossly intact.  1. Ovarian cancer/peritoneal carcinomatosis: Her CA 125 is now within normal limits. Her most recent CT scan did not reveal any changes from her previous imaging. Proceed with her next infusion of chemotherapy using carboplatinum and Abraxane in approximately one week. Patient has a CT scan scheduled on 10/01/2012 with followup with gynecology several days later. Once she completes her imaging and evaluation, will further discuss with gynecology as to how to proceed. Patient and her daughters expressed understanding and were in agreement with this plan. 2. Peripheral neuropathy: Improved, continue current dose of  gabapentin. Do not want to increase dose given her dizziness. 3. Dizziness: Possibly secondary to gabapentin, no change in medication at this time. Monitor.

## 2012-09-20 ENCOUNTER — Other Ambulatory Visit (HOSPITAL_COMMUNITY): Payer: Self-pay | Admitting: Hematology and Oncology

## 2012-09-20 ENCOUNTER — Encounter (HOSPITAL_BASED_OUTPATIENT_CLINIC_OR_DEPARTMENT_OTHER): Payer: Medicare Other

## 2012-09-20 VITALS — BP 121/69 | HR 100 | Temp 98.6°F | Resp 16 | Wt 187.4 lb

## 2012-09-20 DIAGNOSIS — Z889 Allergy status to unspecified drugs, medicaments and biological substances status: Secondary | ICD-10-CM

## 2012-09-20 DIAGNOSIS — Z5111 Encounter for antineoplastic chemotherapy: Secondary | ICD-10-CM

## 2012-09-20 DIAGNOSIS — C786 Secondary malignant neoplasm of retroperitoneum and peritoneum: Secondary | ICD-10-CM

## 2012-09-20 DIAGNOSIS — N9489 Other specified conditions associated with female genital organs and menstrual cycle: Secondary | ICD-10-CM

## 2012-09-20 DIAGNOSIS — C569 Malignant neoplasm of unspecified ovary: Secondary | ICD-10-CM

## 2012-09-20 LAB — CBC WITH DIFFERENTIAL/PLATELET
Basophils Absolute: 0 10*3/uL (ref 0.0–0.1)
Eosinophils Absolute: 0.1 10*3/uL (ref 0.0–0.7)
Eosinophils Relative: 1 % (ref 0–5)
MCH: 31 pg (ref 26.0–34.0)
MCHC: 32.9 g/dL (ref 30.0–36.0)
MCV: 94.3 fL (ref 78.0–100.0)
Platelets: 138 10*3/uL — ABNORMAL LOW (ref 150–400)
RDW: 20.8 % — ABNORMAL HIGH (ref 11.5–15.5)

## 2012-09-20 LAB — COMPREHENSIVE METABOLIC PANEL
ALT: 21 U/L (ref 0–35)
AST: 17 U/L (ref 0–37)
Calcium: 9.3 mg/dL (ref 8.4–10.5)
Creatinine, Ser: 0.64 mg/dL (ref 0.50–1.10)
GFR calc Af Amer: >90 mL/min (ref >90)
Sodium: 139 mEq/L (ref 135–145)
Total Protein: 6.1 g/dL (ref 6.0–8.3)

## 2012-09-20 MED ORDER — PACLITAXEL PROTEIN-BOUND CHEMO INJECTION 100 MG
200.0000 mg/m2 | Freq: Once | INTRAVENOUS | Status: AC
  Administered 2012-09-20: 400 mg via INTRAVENOUS
  Filled 2012-09-20: qty 80

## 2012-09-20 MED ORDER — HYDROCORTISONE NA SUCCINATE PF 250 MG IJ SOLR
125.0000 mg | Freq: Once | INTRAMUSCULAR | Status: AC
  Administered 2012-09-20: 125 mg via INTRAVENOUS

## 2012-09-20 MED ORDER — DIPHENHYDRAMINE HCL 50 MG/ML IJ SOLN
50.0000 mg | Freq: Once | INTRAMUSCULAR | Status: AC
  Administered 2012-09-20: 50 mg via INTRAVENOUS

## 2012-09-20 MED ORDER — SODIUM CHLORIDE 0.9 % IV SOLN
Freq: Once | INTRAVENOUS | Status: AC
  Administered 2012-09-20: 16 mg via INTRAVENOUS
  Filled 2012-09-20: qty 8

## 2012-09-20 MED ORDER — HEPARIN SOD (PORK) LOCK FLUSH 100 UNIT/ML IV SOLN
500.0000 [IU] | Freq: Once | INTRAVENOUS | Status: AC | PRN
  Administered 2012-09-20: 500 [IU]
  Filled 2012-09-20: qty 5

## 2012-09-20 MED ORDER — HEPARIN SOD (PORK) LOCK FLUSH 100 UNIT/ML IV SOLN
INTRAVENOUS | Status: AC
  Filled 2012-09-20: qty 5

## 2012-09-20 MED ORDER — LORAZEPAM 2 MG/ML IJ SOLN
INTRAMUSCULAR | Status: AC
  Filled 2012-09-20: qty 1

## 2012-09-20 MED ORDER — SODIUM CHLORIDE 0.9 % IV SOLN
Freq: Once | INTRAVENOUS | Status: AC
  Administered 2012-09-20: 15:00:00 via INTRAVENOUS
  Filled 2012-09-20: qty 1

## 2012-09-20 MED ORDER — HYDROCORTISONE SOD SUCCINATE 100 MG IJ SOLR
Freq: Once | INTRAVENOUS | Status: DC
  Filled 2012-09-20: qty 2.5

## 2012-09-20 MED ORDER — SODIUM CHLORIDE 0.9 % IV SOLN: 25.0000 mg | Freq: Once | INTRAVENOUS | Status: DC

## 2012-09-20 MED ORDER — SODIUM CHLORIDE 0.9 % IV SOLN
550.0000 mg | Freq: Once | INTRAVENOUS | Status: AC
  Administered 2012-09-20: 550 mg via INTRAVENOUS
  Filled 2012-09-20: qty 55

## 2012-09-20 MED ORDER — SODIUM CHLORIDE 0.9 % IV SOLN
Freq: Once | INTRAVENOUS | Status: AC
  Administered 2012-09-20: 10:00:00 via INTRAVENOUS

## 2012-09-20 MED ORDER — HYDROCORTISONE NA SUCCINATE PF 250 MG IJ SOLR: 125.0000 mg | Freq: Once | INTRAMUSCULAR | Status: DC

## 2012-09-20 MED ORDER — PROMETHAZINE HCL 25 MG/ML IJ SOLN: 25.0000 mg | Freq: Once | INTRAMUSCULAR | Status: DC

## 2012-09-20 MED ORDER — FAMOTIDINE IN NACL 20-0.9 MG/50ML-% IV SOLN
INTRAVENOUS | Status: AC
  Filled 2012-09-20: qty 50

## 2012-09-20 MED ORDER — HYDROCORTISONE NA SUCCINATE PF 250 MG IJ SOLR
INTRAMUSCULAR | Status: AC
  Filled 2012-09-20: qty 250

## 2012-09-20 MED ORDER — FAMOTIDINE IN NACL 20-0.9 MG/50ML-% IV SOLN
20.0000 mg | Freq: Once | INTRAVENOUS | Status: AC
  Administered 2012-09-20: 20 mg via INTRAVENOUS

## 2012-09-20 MED ORDER — PROMETHAZINE HCL 25 MG/ML IJ SOLN: 25.0000 mg | Freq: Four times a day (QID) | INTRAMUSCULAR | Status: DC | PRN

## 2012-09-20 MED ORDER — DIPHENHYDRAMINE HCL 50 MG/ML IJ SOLN
INTRAMUSCULAR | Status: AC
  Filled 2012-09-20: qty 1

## 2012-09-20 MED ORDER — LORAZEPAM 2 MG/ML IJ SOLN
0.5000 mg | Freq: Once | INTRAMUSCULAR | Status: AC
  Administered 2012-09-20: 0.5 mg via INTRAVENOUS

## 2012-09-20 MED ORDER — OXYCODONE-ACETAMINOPHEN 5-325 MG PO TABS: 1.0000 | ORAL_TABLET | ORAL | Status: DC | PRN

## 2012-09-20 NOTE — Progress Notes (Signed)
I was called to see patient was sent to the complaint of arm burning in her D. [place] diarrhea with nausea after Alice E. Veleta Miners is [employer]

## 2012-09-20 NOTE — Progress Notes (Signed)
Was asked to see patient by Corena Herter RN who was said to be having an allergic reaction after carboplatin infusion.  She was said to complaint of diarrhea , burning sensation in her body, and nausea.  I saw the patient and interviewed her prior to this, she had been given Ativan with some relief.  She complained of a burning sensation in the throat and residual nausea.  Vital signs were essentially stable except for mild tachycardia. She was in no acute respiratory distress. Chest exam was negative for any wheezing.  I ordered at the Solu-Cortef 125 mg and Phenergan 25 mg IVPB both of which were administered.  I instructed that the patient been monitored for an hour and vital signs repeated prior to discharge.  I reviewed her repeat vitals and also reevaluated the patient, her symptoms are resolved and she feels well.  I instructed the patient and  2 her relatives to be mindful of delayed phase reaction eventhough this is unlikely given that she had a very mild reaction grade 1.  I stated that they should go  by the ambulance  to the nearest emergency room should this be the case otherwise patient will return to clinic as scheduled.  I do not feel that this episode precludes further administration of carboplatin but would need to be given we can steroid and antihistamine premedication.  Loreen Freud, FACP Hematology/Oncology.

## 2012-09-20 NOTE — Progress Notes (Signed)
09/20/2012 1325 Rita Humphrey finished eating and complained of "burning" in her abdomen, nausea with wretching, and abd pain with cramping and loose stool x 1.  Carbo had completed at this point.  Order rc'd for ativan per nausea.  Dr. Sharia Reeve advised of same and will see patient.  09/20/2012 3:16 PM  Rita Humphrey reports feeling better - no n/v/d, abd cramping, rash symptoms.  Breathing easily and with difficulty.  Dr. Sharia Reeve back to the beside for re-evaluation - advised patient and family that there is a small potential for a delayed reaction, and if symptoms similar to what happened today occur that she needs to go directly to the ED.  Rita Humphrey and family report understanding.

## 2012-09-21 ENCOUNTER — Encounter (HOSPITAL_BASED_OUTPATIENT_CLINIC_OR_DEPARTMENT_OTHER): Payer: Medicare Other

## 2012-09-21 VITALS — BP 137/78 | HR 106 | Temp 98.4°F | Resp 18

## 2012-09-21 DIAGNOSIS — Z5189 Encounter for other specified aftercare: Secondary | ICD-10-CM

## 2012-09-21 DIAGNOSIS — C569 Malignant neoplasm of unspecified ovary: Secondary | ICD-10-CM

## 2012-09-21 DIAGNOSIS — N9489 Other specified conditions associated with female genital organs and menstrual cycle: Secondary | ICD-10-CM

## 2012-09-21 DIAGNOSIS — C786 Secondary malignant neoplasm of retroperitoneum and peritoneum: Secondary | ICD-10-CM

## 2012-09-21 MED ORDER — PEGFILGRASTIM INJECTION 6 MG/0.6ML
6.0000 mg | Freq: Once | SUBCUTANEOUS | Status: AC
  Administered 2012-09-21: 6 mg via SUBCUTANEOUS

## 2012-09-21 MED ORDER — PEGFILGRASTIM INJECTION 6 MG/0.6ML
SUBCUTANEOUS | Status: AC
  Filled 2012-09-21: qty 0.6

## 2012-09-21 NOTE — Progress Notes (Signed)
Rita Humphrey presents today for injection per MD orders. Neulasta 6mg  administered SQ in right Abdomen. Administration without incident. Patient tolerated well.

## 2012-10-01 ENCOUNTER — Ambulatory Visit (HOSPITAL_COMMUNITY)
Admission: RE | Admit: 2012-10-01 | Discharge: 2012-10-01 | Disposition: A | Discharge status: Home / Self Care | Payer: Medicare Other | Source: Ambulatory Visit | Attending: Oncology | Admitting: Oncology

## 2012-10-01 ENCOUNTER — Other Ambulatory Visit (HOSPITAL_COMMUNITY): Payer: Self-pay | Admitting: Oncology

## 2012-10-01 DIAGNOSIS — C569 Malignant neoplasm of unspecified ovary: Secondary | ICD-10-CM | POA: Insufficient documentation

## 2012-10-01 DIAGNOSIS — C786 Secondary malignant neoplasm of retroperitoneum and peritoneum: Secondary | ICD-10-CM

## 2012-10-01 MED ORDER — LORAZEPAM 0.5 MG PO TABS: 0.5000 mg | ORAL_TABLET | ORAL | Status: DC | PRN

## 2012-10-01 MED ORDER — IOHEXOL 300 MG/ML  SOLN
100.0000 mL | Freq: Once | INTRAMUSCULAR | Status: AC | PRN
  Administered 2012-10-01: 100 mL via INTRAVENOUS

## 2012-10-03 ENCOUNTER — Encounter: Payer: Self-pay | Admitting: Gynecologic Oncology

## 2012-10-03 ENCOUNTER — Ambulatory Visit: Payer: Medicare Other | Attending: Gynecologic Oncology | Admitting: Gynecologic Oncology

## 2012-10-03 VITALS — BP 132/80 | HR 88 | Temp 98.4°F | Resp 18 | Ht 63.0 in | Wt 188.4 lb

## 2012-10-03 DIAGNOSIS — Z90710 Acquired absence of both cervix and uterus: Secondary | ICD-10-CM | POA: Insufficient documentation

## 2012-10-03 DIAGNOSIS — Z9221 Personal history of antineoplastic chemotherapy: Secondary | ICD-10-CM | POA: Insufficient documentation

## 2012-10-03 DIAGNOSIS — I10 Essential (primary) hypertension: Secondary | ICD-10-CM | POA: Insufficient documentation

## 2012-10-03 DIAGNOSIS — Z9010 Acquired absence of unspecified breast and nipple: Secondary | ICD-10-CM | POA: Insufficient documentation

## 2012-10-03 DIAGNOSIS — Z853 Personal history of malignant neoplasm of breast: Secondary | ICD-10-CM | POA: Insufficient documentation

## 2012-10-03 DIAGNOSIS — C569 Malignant neoplasm of unspecified ovary: Secondary | ICD-10-CM | POA: Insufficient documentation

## 2012-10-03 NOTE — Progress Notes (Signed)
Consult Note: Gyn-Onc  Rita Humphrey 72 y.o. female  CC:  Chief Complaint  Patient presents with  . Ovarian Cancer    Follow-up visit     HPI:  Patient is a 72-year-old gravida 3 para 3 who has been experiencing abdominal pain since November of 2013. Since December she has lost approximately 20 pounds and during that time is also noted early satiety and not be able to wear her close. She did not seek medical attention until February of this year. She describes her pain is staining pain it has increased since it started in November. She underwent a CT scan of the abdomen and pelvis on March 10. It reveals a moderate amount of ascites or peritoneal cavity. There are multiple soft tissue lesions noted throughout the peritoneal cavity consistent with peritoneal implants of tumor with omental caking as well. The abdominal structures are unremarkable. The pelvis she has ascites as well as multiple peritoneal implants of tumor. The prominent adnexal soft tissue masses bilaterally. On the left a soft tissue mass in the left adnexa measures 7 x 4 cm with a right adnexal soft tissue mass measuring 3.8 x 2.4 cm. She underwent a paracentesis on March 11. Pathology revealed malignant cells consistent with carcinoma. The malignant cells are positive for cytokeratin 7 and show a morphologic and immunophenotypic finding consistent with a gynecologic primary. CA 125 has been drawn that was over 1000.  The patient has a personal history of breast cancer. In 1993 she was diagnosed with a right-sided breast cancer. She was treated with mastectomy on the right and chemotherapy including doxorubicin. She does not require any radiation treatment. She reports she has limited followup. In 2003 show left-sided breast cancer that was early stage. She was treated with a left mastectomy and did not receive any adjuvant chemotherapy or radiation.  She never had any genetic testing.  She underwent expiratory laparotomy and  peritoneal biopsies on April 1. Operative findings included 3.7 L of ascites. 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 She complete coverage of the right hemidiaphragm a tumor. The transverse colon the colon and omentum were diffusely replaced with tumor that was not resectable. The left upper quadrant she agglutination tumor with about that had the appearance of the mass although there is no large abdominal pelvic mass in the left upper quadrant. There is diffuse subcentimeter disease coding almost the entire surface of the small bowel mesentery. The right ovary was agglutinated attached to the rectosigmoid colon with a thick rind of tumor connecting it. She was not deemed resectable. A biopsy was performed it revealed high-grade carcinoma. 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 She underwent 1 cycle of Taxotere and carboplatin based chemotherapy in the hospital.   After 3 cycles of chemotherapy her CA 125 went from 1097 to 54.9 on June 4. On May 27th she had a CT scan that revealed:  Findings: Lung bases: Bilateral mastectomy. Clear lung bases. Normal heart size without pericardial or pleural effusion.  Abdomen/pelvis: Normal liver, spleen, stomach, pancreas, gallbladder, biliary tract, adrenal glands. Left renal angiomyolipoma of 9 mm. Bilateral renal sinus cysts, without hydronephrosis. No retroperitoneal or retrocrural adenopathy. Sigmoid diverticulosis with muscular hypertrophy. Normal terminal ileum. Normal caliber of small bowel loops. Nearly completely resolved abdominal ascites. There is minimal fluid identified adjacent the spleen on image 25/series 2 and along the right lobe  of the liver on image 37/series 2. Slight decrease in omental nodularity/caking.   This is ill-defined, but measures on the order of 8.0 x 3.6 cm on image 45/series 2. No pelvic adenopathy. Normal urinary bladder. Similar cul-de-sac  small volume fluid, minimally eccentric left. Left adnexal mass measures 4.2 x 2.7 cm versus 7.0 x 4.0 cm on the prior. Right adnexal mass of 3.5 x 2.5 cm compares with 3.8 x 2.4 cm on the prior.  Bones/Musculoskeletal: Mild osteopenia. L4-S1 disc bulges.   IMPRESSION:  1. Overall response to therapy, with decreased omental/peritoneal disease and nearly completely resolved ascites.  2. Decreased size of bilateral ovarian/adnexal masses.  3. Similar small volume cul-de-sac fluid.  4. Similar left renal angiomyolipoma.  She had cycle #4 of chemotherapy on June 5. She's undergone the subsequent 3 cycles with carboplatin and Taxol. Her first cycle of chemotherapy was with carboplatin and Taxotere. They had discussed the results of the CT scan with Dr. Neijstrom. I last saw her on June 12. The plan was to continue with chemotherapy and she has had an additional 2 cycles of chemotherapy since I saw her with the last one being on September 20, 2012 under the care of Dr. Timothy sent again. She had a CT scan on July 28 that revealed:  The urinary bladder appears normal. Previous hysterectomy. Left adnexal soft tissue structure measures 2.2 x 4.0 cm, image 76/series 2. The right adnexal soft tissue structure measures 2.3 x 3.1 cm, image 76/series 2. Previously 2.6 x 3.5 cm. Loculated fluid within the inferior left pelvis measures 3.5 x 2.5 cm, image 79/series 2. Previously 3.7 x 2.7 cm. Omental tumor is identified measuring 7.5 x 3.0 cm, image 43/series 2. Previously 7.9 x 3.3 cm. The abdominal aorta has a normal caliber. No aneurysm. No upper abdominal adenopathy identified. There is no enlarged pelvic or inguinal adenopathy identified. The stomach is normal. The small bowel loops are within normal limits. No evidence for small bowel obstruction. Normal caliber of the colon multiple distal colonic diverticula identified without acute inflammation. Review of the visualized bony structures is significant for mild lumbar  spondylosis. No aggressive lytic or sclerotic bone lesions.  IMPRESSION:  1. No acute findings.  2. Lateral mild improvement and peritoneal disease. No new or progressive disease identified.  3. No evidence for obstructive uropathy or bowel obstruction.  Her CA 125 is normalized at 20.1.  Review of Systems: She said this last cycle of chemotherapy is still more difficult. She's had more nausea this cycle and is taking Zofran once or twice a day. She states that she has more neuropathy in her legs is mostly pain and weakness she states that her oncologist is aware. She denies a change about bladder habits other than having some diarrhea this week. She does feel like she has a viral illness. She does feel cold and has been afebrile. She denies any chest pain, shortness of breath. She denies any significant unintentional weight loss or weight gain.  Current Meds:  Outpatient Encounter Prescriptions as of 10/03/2012  Medication Sig Dispense Refill  . Alum & Mag Hydroxide-Simeth (MAGIC MOUTHWASH W/LIDOCAINE) SOLN Take 5 mLs by mouth 4 (four) times daily as needed.  240 mL  1  . amLODipine (NORVASC) 10 MG tablet Take 10 mg by mouth daily before breakfast.       . clotrimazole-betamethasone (LOTRISONE) cream Apply topically 2 (two) times daily.  30 g  0  . dexamethasone (DECADRON) 4 MG tablet 8mg PO at 9pm before chemo, 8mg at 3am the AM of chemo. Day after chemo, take   8mg BIDx 2 days. Then Stop. Repeat with each cycle.  30 tablet  1  . docusate sodium (COLACE) 100 MG capsule Take 100 mg by mouth 2 (two) times daily. Taking 2 at bedtime      . gabapentin (NEURONTIN) 300 MG capsule Take 1 tablet Po x 3 dasy at HS, then 2 PO at HS x 3 days and then 3 tablets PO at HS thereafter  90 capsule  1  . lidocaine-prilocaine (EMLA) cream Apply topically as needed. Apply a quarter size amount to port site 1 hour prior to chemo. Do not rub in. Cover with plastic.  30 g  PRN  . LORazepam (ATIVAN) 0.5 MG tablet Take 1  tablet (0.5 mg total) by mouth every 3 (three) hours as needed (nausea/vomiting).  45 tablet  1  . magnesium hydroxide (MILK OF MAGNESIA) 400 MG/5ML suspension Take 30 mLs by mouth daily as needed for constipation.      . nystatin (MYCOSTATIN) powder Apply topically 3 (three) times daily.  30 g  1  . ondansetron (ZOFRAN) 8 MG tablet Starting the day after chemo, take 1 tablet two times a day for 2 days. Then may take 1 tablet every 8 hours if needed for nausea/vomiting.  45 tablet  3  . oxyCODONE-acetaminophen (PERCOCET/ROXICET) 5-325 MG per tablet Take 1-2 tablets by mouth every 4 (four) hours as needed.  60 tablet  0  . potassium chloride SA (K-DUR,KLOR-CON) 20 MEQ tablet Take 1 tablet (20 mEq total) by mouth daily.  30 tablet  2   No facility-administered encounter medications on file as of 10/03/2012.    Allergy:  Allergies  Allergen Reactions  . Penicillins Anaphylaxis and Nausea And Vomiting  . Morphine And Related Nausea And Vomiting    Social Hx:   History   Social History  . Marital Status: Widowed    Spouse Name: N/A    Number of Children: N/A  . Years of Education: N/A   Occupational History  . Not on file.   Social History Main Topics  . Smoking status: Never Smoker   . Smokeless tobacco: Never Used  . Alcohol Use: No  . Drug Use: No  . Sexually Active: No   Other Topics Concern  . Not on file   Social History Narrative  . No narrative on file    Past Surgical Hx: Breast cancer surgery in 1993 and 2003. Bladder tacking in 1982 Past Surgical History  Procedure Laterality Date  . Thyroid lobectomy    . Foot surgery      2 spurs taken out of foot  . Varicose vein surgery    . Bladder tack surgery     . Tonsillectomy    . Mastectomy      bilateral   . Laparotomy Bilateral 06/05/2012    Procedure: EXPLORATORY LAPAROTOMY WITH PELVIC TUMOR BIOPSY;  Surgeon: Toretto Tingler A. Ameriah Lint, MD;  Location: [facility];  Service: Gynecology;  Laterality: Bilateral;  . Abdominal  hysterectomy  1980  . Vascular surgery      veins stripped from both legs  . Portacath placement Left 06/25/2012    Procedure: INSERTION PORT-A-CATH;  Surgeon: Mark A Jenkins, MD;  Location: [facility];  Service: General;  Laterality: Left;    Past Medical Hx:  Past Medical History  Diagnosis Date  . Hypertension   . Hyperlipemia   . Thyroid disease   . Asthma   . Cancer     [place]  . Breast cancer       s/p bilateral mastectomy 1993,2003  . Ovarian cancer 05/17/2012  . 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 Adnexal mass 05/16/2012  . Shortness of breath     with exertion   . Peripheral vascular disease     hx of DVT   . Kidney stones     x 2  . 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 She never underwent any genetic testing that she is aware of. Family History  Problem Relation Age of Onset  . Cancer Mother     throat cancer  . Hypertension Father   . Heart attack Brother     5 brothers  . Hypertension Sister     Vitals:  Blood pressure 132/80, pulse 88, temperature 98.4 F (36.9 C), temperature source Oral, resp. rate 18, height 5' 3" (1.6 m), weight 188 lb 6.4 oz (85.458 kg).  Physical Exam: Well-nourished well-developed female in no acute distress.  HEENT: Fever blister of left-sided lower lip.  Neck: Supple, no lymphadenopathy, no thyromegaly.  Cardiovascular: Regular rate and rhythm.  Lungs: Clear to auscultation bilaterally  Incision is well-healed. There is no fluid wave. Abdomen is soft his minimally tender to deep palpation in the mid abdomen just under the epigastrum. There is no hepatosplenomegaly.  Groins: No lymphadenopathy.  Extremities 1+ nonpitting edema equal bilaterally.  Pelvic: Bimanual examination reveals no masses or nodularity.   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 In the setting of her having breast cancer twice, I feel that this may be related to a BRCA mutation. The patient will require postoperative genetic testing as she has daughters. She does not feel that she could afford that at this time.   She appears to have fairly stable disease after an additional 2 cycles of chemotherapy. I spent over 45 minutes face to face time independent of her physical examination with the patient and her daughters reviewing the CT scan findings. I discussed with them that concern that she has developed platinum and taxine resistant disease. It is at this point that the discussed with me that she developed some chest tightness and redness with her last cycle of chemotherapy. They did not say that she had an allergic reaction at the infusion center below the nurses intimated this. It may be that she had a carboplatin reaction and this will need to be elucidated. However, the options discussed with them include if she does have platinum resistant disease there is most likely no significant role for proceeding with any kind of debulking surgery and noted to proceed with any than [employer]J approved medications for platinum refractory ovarian cancer including gemcitabine, liposomal doxorubicin and topotecan. We also discussed proceeding with a diagnostic laparoscopy in at the time of laparoscopy it appears that her tumor is resectable we could proceed with aggressive surgery. If her tumor is not resectable we could at temp to obtain a biopsy for, resistance testing and chemotherapy to be started as soon as possible. They had concerns regarding insurance coverage and chemotherapy resistance testing and we will try to explore this for them prior to surgery. There were told several times a day did not need to make a decision today in clinic. However the patient very much wanted to make a decision today and she is opting towards    definitive surgery with a diagnostic laparoscopy and debulking is feasible proceeding with that. She understands that she'll need chemotherapy after surgery. She is tentatively scheduled for surgery for August 26. We will need to ensure that she has recovered fully from her chemotherapy prior to that time. Their questions regarding this were answered to their satisfaction. They will call us if they have any other issues as they discussed this is a family.  Yula Crotwell A., MD 10/03/2012, 5:17 PM  

## 2012-10-04 ENCOUNTER — Telehealth (HOSPITAL_COMMUNITY): Payer: Self-pay | Admitting: *Deleted

## 2012-10-04 ENCOUNTER — Other Ambulatory Visit (HOSPITAL_COMMUNITY): Payer: Self-pay | Admitting: Oncology

## 2012-10-04 NOTE — Telephone Encounter (Signed)
I would continue Gabapentin for 2 more days and take 1 pill at HS each of those days and then stop (taper her down). She can stop her K+. (previous message left on Rita Humphrey's voicemail).

## 2012-10-04 NOTE — Telephone Encounter (Signed)
Patient's daughter [name] called to let us know that patient saw surgeon yday. Surgeon told them that chemo really hadn't helped that much. Surgeon wants to perform surgery on 8/26 (she will open her, run a light, and if able to do surgery - will do surgery). If not, she will close her up and restart her on chemo.   Rita Humphrey said that the gabapentin wasn't helping. Patient was taking 2 a day and didn't see a change or where it was helping at all. The pill was also making her sleepy. I asked Rita Humphrey if her mom wanted to take the pill and she said no. I told her she could stop taking it then.   Rita Humphrey wants to know if she needs to continue the Potassium? Can you respond to this please Tom?

## 2012-10-23 ENCOUNTER — Other Ambulatory Visit (HOSPITAL_COMMUNITY): Payer: Medicare Other

## 2012-10-24 ENCOUNTER — Encounter (HOSPITAL_COMMUNITY): Payer: Self-pay | Admitting: Pharmacy Technician

## 2012-10-24 ENCOUNTER — Telehealth: Payer: Self-pay | Admitting: Gynecologic Oncology

## 2012-10-24 NOTE — Telephone Encounter (Signed)
Spoke with Rita Humphrey regarding [employer] and she will tell her mother that it may or may not be covered by insurance and that [employer] may or may not write off the remaining balance. They wish to proceed with this testing to help guide chemotherapy decisions. PG

## 2012-10-25 NOTE — Progress Notes (Addendum)
ekg 06-01-2012 epic

## 2012-10-25 NOTE — Patient Instructions (Addendum)
20 TAE ROBAK  10/25/2012   Your procedure is scheduled on: 10-30-2012  Report to [facility] at 930 AM.  Call this number if you have problems the morning of surgery [phone]   Remember:   Do not eat food  :After Midnight Sunday night.              Clear liquids all day Monday 10-29-2012, no clear liquids after midnight Monday night.     Take these medicines the morning of surgery with A SIP OF WATER: amlodipine, oxycodone if needed, eye drop if needed                                SEE [facility] PREPARING FOR SURGERY SHEET   Do not wear jewelry, make-up or nail polish.  Do not wear lotions, powders, or perfumes. You may wear deodorant.   Men may shave face and neck.  Do not bring valuables to the hospital. [facility] IS NOT RESPONSIBLE FOR VALUEABLES.  Contacts, dentures or bridgework may not be worn into surgery.  Leave suitcase in the car. After surgery it may be brought to your room.  For patients admitted to the hospital, checkout time is 11:00 AM the day of discharge.   Patients discharged the day of surgery will not be allowed to drive home.  Name and phone number of your driver:  Special Instructions: N/A   Please read over the following fact sheets that you were given: incentive spirometer fact sheet, blood fact sheet, clear liquid sheet.  Call Cain Sieve RN pre op nurse if needed [phone]    FAILURE TO FOLLOW THESE INSTRUCTIONS MAY RESULT IN THE CANCELLATION OF YOUR SURGERY.  PATIENT SIGNATURE___________________________________________  NURSE SIGNATURE_____________________________________________

## 2012-10-26 ENCOUNTER — Ambulatory Visit (HOSPITAL_COMMUNITY)
Admission: RE | Admit: 2012-10-26 | Discharge: 2012-10-26 | Disposition: A | Discharge status: Home / Self Care | Payer: Medicare Other | Source: Ambulatory Visit | Attending: Gynecologic Oncology | Admitting: Gynecologic Oncology

## 2012-10-26 ENCOUNTER — Encounter (HOSPITAL_COMMUNITY): Payer: Self-pay

## 2012-10-26 ENCOUNTER — Encounter (HOSPITAL_COMMUNITY)
Admission: RE | Admit: 2012-10-26 | Discharge: 2012-10-26 | Disposition: A | Discharge status: Home / Self Care | Payer: Medicare Other | Source: Ambulatory Visit | Attending: Gynecologic Oncology | Admitting: Gynecologic Oncology

## 2012-10-26 DIAGNOSIS — C569 Malignant neoplasm of unspecified ovary: Secondary | ICD-10-CM | POA: Insufficient documentation

## 2012-10-26 HISTORY — DX: Anemia, unspecified: D64.9

## 2012-10-26 HISTORY — DX: Dizziness and giddiness: R42

## 2012-10-26 HISTORY — DX: Thrombocytopenia, unspecified: D69.6

## 2012-10-26 HISTORY — DX: Anesthesia of skin: R20.0

## 2012-10-26 HISTORY — DX: Unspecified hearing loss, left ear: H91.92

## 2012-10-26 HISTORY — DX: Personal history of urinary calculi: Z87.442

## 2012-10-26 LAB — CBC WITH DIFFERENTIAL/PLATELET
Basophils Absolute: 0 10*3/uL (ref 0.0–0.1)
Basophils Relative: 0 % (ref 0–1)
HCT: 41.1 % (ref 36.0–46.0)
MCHC: 32.6 g/dL (ref 30.0–36.0)
Monocytes Absolute: 0.9 10*3/uL (ref 0.1–1.0)
Neutro Abs: 5.9 10*3/uL (ref 1.7–7.7)
Neutrophils Relative %: 61 % (ref 43–77)
RDW: 14.6 % (ref 11.5–15.5)

## 2012-10-26 LAB — COMPREHENSIVE METABOLIC PANEL
AST: 21 U/L (ref 0–37)
Albumin: 3.7 g/dL (ref 3.5–5.2)
Calcium: 9.9 mg/dL (ref 8.4–10.5)
Chloride: 104 mEq/L (ref 96–112)
Creatinine, Ser: 0.67 mg/dL (ref 0.50–1.10)
Total Bilirubin: 0.3 mg/dL (ref 0.3–1.2)

## 2012-10-29 ENCOUNTER — Telehealth: Payer: Self-pay | Admitting: Gynecologic Oncology

## 2012-10-29 NOTE — Telephone Encounter (Signed)
Telephone call to check on pre-operative status.  Message left.  Reinforced NPO after midnight.  Instructed to call for any needs. 

## 2012-10-30 ENCOUNTER — Inpatient Hospital Stay (HOSPITAL_COMMUNITY): Payer: Medicare Other | Admitting: Certified Registered Nurse Anesthetist

## 2012-10-30 ENCOUNTER — Encounter (HOSPITAL_COMMUNITY)
Admission: RE | Disposition: A | Discharge status: Home / Self Care | Payer: Self-pay | Source: Ambulatory Visit | Attending: Obstetrics & Gynecology

## 2012-10-30 ENCOUNTER — Encounter (HOSPITAL_COMMUNITY): Payer: Self-pay | Admitting: *Deleted

## 2012-10-30 ENCOUNTER — Inpatient Hospital Stay (HOSPITAL_COMMUNITY)
Admission: RE | Admit: 2012-10-30 | Discharge: 2012-11-02 | DRG: 737 | Disposition: A | Discharge status: Home / Self Care | Payer: Medicare Other | Source: Ambulatory Visit | Attending: Obstetrics & Gynecology | Admitting: Obstetrics & Gynecology

## 2012-10-30 ENCOUNTER — Encounter (HOSPITAL_COMMUNITY): Payer: Self-pay | Admitting: Certified Registered Nurse Anesthetist

## 2012-10-30 DIAGNOSIS — Z9221 Personal history of antineoplastic chemotherapy: Secondary | ICD-10-CM

## 2012-10-30 DIAGNOSIS — R180 Malignant ascites: Secondary | ICD-10-CM | POA: Diagnosis present

## 2012-10-30 DIAGNOSIS — C569 Malignant neoplasm of unspecified ovary: Principal | ICD-10-CM | POA: Diagnosis present

## 2012-10-30 DIAGNOSIS — Z853 Personal history of malignant neoplasm of breast: Secondary | ICD-10-CM

## 2012-10-30 DIAGNOSIS — C786 Secondary malignant neoplasm of retroperitoneum and peritoneum: Secondary | ICD-10-CM | POA: Diagnosis present

## 2012-10-30 DIAGNOSIS — D3000 Benign neoplasm of unspecified kidney: Secondary | ICD-10-CM | POA: Diagnosis present

## 2012-10-30 DIAGNOSIS — I10 Essential (primary) hypertension: Secondary | ICD-10-CM | POA: Diagnosis present

## 2012-10-30 HISTORY — PX: LAPAROSCOPY: SHX197

## 2012-10-30 LAB — TYPE AND SCREEN: Antibody Screen: NEGATIVE

## 2012-10-30 SURGERY — LAPAROSCOPY, DIAGNOSTIC
Anesthesia: General | Wound class: Clean Contaminated

## 2012-10-30 MED ORDER — ZOLPIDEM TARTRATE 5 MG PO TABS: 5.0000 mg | ORAL_TABLET | Freq: Every evening | ORAL | Status: DC | PRN

## 2012-10-30 MED ORDER — SUCCINYLCHOLINE CHLORIDE 20 MG/ML IJ SOLN
INTRAMUSCULAR | Status: DC | PRN
  Administered 2012-10-30: 100 mg via INTRAVENOUS

## 2012-10-30 MED ORDER — LIDOCAINE HCL (CARDIAC) 20 MG/ML IV SOLN
INTRAVENOUS | Status: DC | PRN
  Administered 2012-10-30: 100 mg via INTRAVENOUS

## 2012-10-30 MED ORDER — MIDAZOLAM HCL 5 MG/5ML IJ SOLN
INTRAMUSCULAR | Status: DC | PRN
  Administered 2012-10-30: 2 mg via INTRAVENOUS

## 2012-10-30 MED ORDER — ONDANSETRON HCL 4 MG/2ML IJ SOLN
INTRAMUSCULAR | Status: DC | PRN
  Administered 2012-10-30: 4 mg via INTRAVENOUS

## 2012-10-30 MED ORDER — LACTATED RINGERS IV SOLN
INTRAVENOUS | Status: DC
  Administered 2012-10-30: 1000 mL via INTRAVENOUS
  Administered 2012-10-30: 13:00:00 via INTRAVENOUS

## 2012-10-30 MED ORDER — FENTANYL CITRATE 0.05 MG/ML IJ SOLN
INTRAMUSCULAR | Status: DC | PRN
  Administered 2012-10-30 (×5): 50 ug via INTRAVENOUS

## 2012-10-30 MED ORDER — ENOXAPARIN SODIUM 40 MG/0.4ML ~~LOC~~ SOLN
40.0000 mg | SUBCUTANEOUS | Status: DC
  Administered 2012-10-31 – 2012-11-02 (×3): 40 mg via SUBCUTANEOUS
  Filled 2012-10-30 (×3): qty 0.4

## 2012-10-30 MED ORDER — ENOXAPARIN SODIUM 40 MG/0.4ML ~~LOC~~ SOLN
40.0000 mg | SUBCUTANEOUS | Status: AC
  Administered 2012-10-30: 40 mg via SUBCUTANEOUS
  Filled 2012-10-30: qty 0.4

## 2012-10-30 MED ORDER — 0.9 % SODIUM CHLORIDE (POUR BTL) OPTIME
TOPICAL | Status: DC | PRN
  Administered 2012-10-30: 3000 mL

## 2012-10-30 MED ORDER — PROPOFOL 10 MG/ML IV BOLUS
INTRAVENOUS | Status: DC | PRN
  Administered 2012-10-30: 150 mg via INTRAVENOUS

## 2012-10-30 MED ORDER — ONDANSETRON HCL 4 MG PO TABS: 4.0000 mg | ORAL_TABLET | Freq: Four times a day (QID) | ORAL | Status: DC | PRN

## 2012-10-30 MED ORDER — KCL IN DEXTROSE-NACL 20-5-0.45 MEQ/L-%-% IV SOLN
INTRAVENOUS | Status: DC
  Administered 2012-10-30 – 2012-10-31 (×3): via INTRAVENOUS
  Administered 2012-10-31 – 2012-11-01 (×2): 125 mL via INTRAVENOUS
  Filled 2012-10-30 (×7): qty 1000

## 2012-10-30 MED ORDER — KETAMINE HCL 10 MG/ML IJ SOLN
INTRAMUSCULAR | Status: DC | PRN
  Administered 2012-10-30: 20 mg via INTRAVENOUS

## 2012-10-30 MED ORDER — ONDANSETRON HCL 4 MG/2ML IJ SOLN
4.0000 mg | Freq: Four times a day (QID) | INTRAMUSCULAR | Status: DC | PRN
  Administered 2012-10-30: 4 mg via INTRAVENOUS
  Filled 2012-10-30: qty 2

## 2012-10-30 MED ORDER — CIPROFLOXACIN IN D5W 400 MG/200ML IV SOLN
400.0000 mg | Freq: Two times a day (BID) | INTRAVENOUS | Status: DC
  Administered 2012-10-30: 400 mg via INTRAVENOUS
  Filled 2012-10-30 (×3): qty 200

## 2012-10-30 MED ORDER — ROCURONIUM BROMIDE 100 MG/10ML IV SOLN
INTRAVENOUS | Status: DC | PRN
  Administered 2012-10-30 (×5): 10 mg via INTRAVENOUS
  Administered 2012-10-30: 30 mg via INTRAVENOUS

## 2012-10-30 MED ORDER — NEOSTIGMINE METHYLSULFATE 1 MG/ML IJ SOLN
INTRAMUSCULAR | Status: DC | PRN
  Administered 2012-10-30: 5 mg via INTRAVENOUS

## 2012-10-30 MED ORDER — FENTANYL CITRATE 0.05 MG/ML IJ SOLN
25.0000 ug | INTRAMUSCULAR | Status: DC | PRN
  Administered 2012-10-30 (×2): 25 ug via INTRAVENOUS

## 2012-10-30 MED ORDER — METRONIDAZOLE IN NACL 5-0.79 MG/ML-% IV SOLN
500.0000 mg | Freq: Once | INTRAVENOUS | Status: AC
  Administered 2012-10-30: 500 mg via INTRAVENOUS
  Filled 2012-10-30: qty 100

## 2012-10-30 MED ORDER — PHENYLEPHRINE HCL 10 MG/ML IJ SOLN
INTRAMUSCULAR | Status: DC | PRN
  Administered 2012-10-30: 80 ug via INTRAVENOUS
  Administered 2012-10-30 (×4): 40 ug via INTRAVENOUS

## 2012-10-30 MED ORDER — DIPHENHYDRAMINE HCL 50 MG/ML IJ SOLN: 12.5000 mg | Freq: Four times a day (QID) | INTRAMUSCULAR | Status: DC | PRN

## 2012-10-30 MED ORDER — BUPIVACAINE LIPOSOME 1.3 % IJ SUSP
20.0000 mL | Freq: Once | INTRAMUSCULAR | Status: DC
  Filled 2012-10-30: qty 20

## 2012-10-30 MED ORDER — HYDROMORPHONE 0.3 MG/ML IV SOLN
INTRAVENOUS | Status: DC
Start: 2012-10-30 — End: 2012-11-01
  Administered 2012-10-30: 15:00:00 via INTRAVENOUS
  Administered 2012-10-30: 0.999 mg via INTRAVENOUS
  Administered 2012-10-31: 0.59 mg via INTRAVENOUS
  Administered 2012-10-31: 0.399 mg via INTRAVENOUS
  Administered 2012-10-31: 0.99 mg via INTRAVENOUS
  Administered 2012-10-31: 0.4 mg via INTRAVENOUS
  Administered 2012-10-31: 1.39 mg via INTRAVENOUS
  Administered 2012-10-31: 0.59 mg via INTRAVENOUS
  Administered 2012-11-01: 02:00:00 via INTRAVENOUS
  Administered 2012-11-01: 0.2 mg via INTRAVENOUS
  Administered 2012-11-01: 0.599 mg via INTRAVENOUS
  Administered 2012-11-01: 0.2 mg via INTRAVENOUS
  Filled 2012-10-30: qty 25

## 2012-10-30 MED ORDER — PHENYLEPHRINE HCL 10 MG/ML IJ SOLN
10.0000 mg | INTRAVENOUS | Status: DC | PRN
  Administered 2012-10-30: 50 ug/min via INTRAVENOUS

## 2012-10-30 MED ORDER — HYDROMORPHONE HCL PF 1 MG/ML IJ SOLN
INTRAMUSCULAR | Status: DC | PRN
  Administered 2012-10-30 (×2): 0.5 mg via INTRAVENOUS
  Administered 2012-10-30: 1 mg via INTRAVENOUS

## 2012-10-30 MED ORDER — SODIUM CHLORIDE 0.9 % IJ SOLN: 9.0000 mL | INTRAMUSCULAR | Status: DC | PRN

## 2012-10-30 MED ORDER — DIPHENHYDRAMINE HCL 12.5 MG/5ML PO ELIX
12.5000 mg | ORAL_SOLUTION | Freq: Four times a day (QID) | ORAL | Status: DC | PRN
  Administered 2012-10-31: 12.5 mg via ORAL
  Filled 2012-10-30: qty 5

## 2012-10-30 MED ORDER — NALOXONE HCL 0.4 MG/ML IJ SOLN: 0.4000 mg | INTRAMUSCULAR | Status: DC | PRN

## 2012-10-30 MED ORDER — ACETAMINOPHEN 10 MG/ML IV SOLN
1000.0000 mg | Freq: Four times a day (QID) | INTRAVENOUS | Status: AC
  Administered 2012-10-30 – 2012-10-31 (×4): 1000 mg via INTRAVENOUS
  Filled 2012-10-30 (×4): qty 100

## 2012-10-30 MED ORDER — HYDROMORPHONE HCL PF 1 MG/ML IJ SOLN: 0.2500 mg | INTRAMUSCULAR | Status: DC | PRN

## 2012-10-30 MED ORDER — PROMETHAZINE HCL 25 MG/ML IJ SOLN: 6.2500 mg | INTRAMUSCULAR | Status: DC | PRN

## 2012-10-30 MED ORDER — GLYCOPYRROLATE 0.2 MG/ML IJ SOLN
INTRAMUSCULAR | Status: DC | PRN
  Administered 2012-10-30: 0.6 mg via INTRAVENOUS

## 2012-10-30 MED ORDER — OXYCODONE-ACETAMINOPHEN 5-325 MG PO TABS
1.0000 | ORAL_TABLET | ORAL | Status: DC | PRN
  Administered 2012-11-01 – 2012-11-02 (×2): 2 via ORAL
  Filled 2012-10-30 (×2): qty 2

## 2012-10-30 MED ORDER — SODIUM CHLORIDE 0.9 % IJ SOLN
INTRAMUSCULAR | Status: DC | PRN
  Administered 2012-10-30: 14:00:00

## 2012-10-30 MED ORDER — DEXAMETHASONE SODIUM PHOSPHATE 10 MG/ML IJ SOLN
INTRAMUSCULAR | Status: DC | PRN
  Administered 2012-10-30: 10 mg via INTRAVENOUS

## 2012-10-30 SURGICAL SUPPLY — 53 items
ATTRACTOMAT 16X20 MAGNETIC DRP (DRAPES) ×2 IMPLANT
BAG URINE DRAINAGE (UROLOGICAL SUPPLIES) IMPLANT
BENZOIN TINCTURE PRP APPL 2/3 (GAUZE/BANDAGES/DRESSINGS) ×2 IMPLANT
BLADE EXTENDED COATED 6.5IN (ELECTRODE) ×2 IMPLANT
CANISTER SUCTION 2500CC (MISCELLANEOUS) ×2 IMPLANT
CATH FOLEY 2WAY SLVR  5CC 16FR (CATHETERS)
CATH FOLEY 2WAY SLVR 5CC 16FR (CATHETERS) IMPLANT
CHLORAPREP W/TINT 26ML (MISCELLANEOUS) ×2 IMPLANT
CLIP TI MEDIUM LARGE 6 (CLIP) IMPLANT
CLOTH BEACON ORANGE TIMEOUT ST (SAFETY) ×2 IMPLANT
CONT SPEC 4OZ CLIKSEAL STRL BL (MISCELLANEOUS) IMPLANT
COVER SURGICAL LIGHT HANDLE (MISCELLANEOUS) ×2 IMPLANT
DECANTER SPIKE VIAL GLASS SM (MISCELLANEOUS) IMPLANT
DRAPE CAMERA CLOSED 9X96 (DRAPES) ×2 IMPLANT
DRAPE WARM FLUID 44X44 (DRAPE) ×2 IMPLANT
DRSG TEGADERM 2-3/8X2-3/4 SM (GAUZE/BANDAGES/DRESSINGS) ×2 IMPLANT
DRSG TELFA 4X10 ISLAND STR (GAUZE/BANDAGES/DRESSINGS) ×2 IMPLANT
ELECT REM PT RETURN 9FT ADLT (ELECTROSURGICAL) ×2
ELECTRODE REM PT RTRN 9FT ADLT (ELECTROSURGICAL) ×1 IMPLANT
GAUZE SPONGE 2X2 8PLY STRL LF (GAUZE/BANDAGES/DRESSINGS) ×1 IMPLANT
GLOVE BIO SURGEON STRL SZ 6.5 (GLOVE) ×4 IMPLANT
GLOVE BIOGEL PI IND STRL 7.0 (GLOVE) ×1 IMPLANT
GLOVE BIOGEL PI INDICATOR 7.0 (GLOVE) ×1
GOWN PREVENTION PLUS XLARGE (GOWN DISPOSABLE) ×4 IMPLANT
GOWN STRL NON-REIN LRG LVL3 (GOWN DISPOSABLE) ×4 IMPLANT
HOLDER FOLEY CATH W/STRAP (MISCELLANEOUS) IMPLANT
KIT BASIN OR (CUSTOM PROCEDURE TRAY) ×2 IMPLANT
LIGASURE IMPACT 36 18CM CVD LR (INSTRUMENTS) ×2 IMPLANT
NEEDLE HYPO 22GX1.5 SAFETY (NEEDLE) ×4 IMPLANT
NEEDLE HYPO 25X1 1.5 SAFETY (NEEDLE) IMPLANT
NS IRRIG 1000ML POUR BTL (IV SOLUTION) IMPLANT
PACK GENERAL/GYN (CUSTOM PROCEDURE TRAY) ×2 IMPLANT
SHEET LAVH (DRAPES) ×2 IMPLANT
SOLUTION ANTI FOG 6CC (MISCELLANEOUS) ×2 IMPLANT
SPONGE GAUZE 2X2 STER 10/PKG (GAUZE/BANDAGES/DRESSINGS) ×1
SPONGE GAUZE 4X4 12PLY (GAUZE/BANDAGES/DRESSINGS) IMPLANT
SPONGE LAP 18X18 X RAY DECT (DISPOSABLE) ×4 IMPLANT
SPONGE SURGIFOAM ABS GEL 12-7 (HEMOSTASIS) ×2 IMPLANT
STAPLER VISISTAT 35W (STAPLE) ×2 IMPLANT
STRIP CLOSURE SKIN 1/2X4 (GAUZE/BANDAGES/DRESSINGS) ×2 IMPLANT
SUT PDS AB 1 CTXB1 36 (SUTURE) ×6 IMPLANT
SUT VIC AB 0 CT1 36 (SUTURE) IMPLANT
SUT VIC AB 2-0 CT2 27 (SUTURE) IMPLANT
SUT VIC AB 4-0 PS2 27 (SUTURE) ×4 IMPLANT
SUT VICRYL 2 0 18  UND BR (SUTURE) ×1
SUT VICRYL 2 0 18 UND BR (SUTURE) ×1 IMPLANT
SYR 20CC LL (SYRINGE) ×4 IMPLANT
SYR CONTROL 10ML LL (SYRINGE) IMPLANT
TOWEL OR 17X26 10 PK STRL BLUE (TOWEL DISPOSABLE) ×2 IMPLANT
TOWEL OR NON WOVEN STRL DISP B (DISPOSABLE) ×2 IMPLANT
TRAY FOLEY CATH 14FRSI W/METER (CATHETERS) ×2 IMPLANT
TROCAR BLADELESS OPT 5 100 (ENDOMECHANICALS) ×2 IMPLANT
TUBING INSUFFLATION 10FT LAP (TUBING) ×2 IMPLANT

## 2012-10-30 NOTE — Transfer of Care (Signed)
Immediate Anesthesia Transfer of Care Note  Patient: Rita Humphrey  Procedure(s) Performed: Procedure(s) (LRB): LAPAROSCOPY DIAGNOSTIC, EXPLORATORY LAPAROTOMY, BILATERAL SALPINGO-OOPHORECTOMY, TUMOR DEBULKING (N/A)  Patient Location: PACU  Anesthesia Type: General  Level of Consciousness: sedated, patient cooperative and responds to stimulaton  Airway & Oxygen Therapy: Patient Spontanous Breathing and Patient connected to face mask oxgen  Post-op Assessment: Report given to PACU RN and Post -op Vital signs reviewed and stable  Post vital signs: Reviewed and stable  Complications: No apparent anesthesia complications

## 2012-10-30 NOTE — Preoperative (Signed)
Beta Blockers   Reason not to administer Beta Blockers:Not Applicable 

## 2012-10-30 NOTE — H&P (View-Only) (Signed)
Consult Note: Gyn-Onc  Rita Humphrey 72 y.o. female  CC:  Chief Complaint  Patient presents with  . Ovarian Cancer    Follow-up visit     HPI:  Patient is a 72 year old gravida 3 para 3 who has been experiencing abdominal pain since November of 2013. Since December she has lost approximately 20 pounds and during that time is also noted early satiety and not be able to wear her close. She did not seek medical attention until February of this year. She describes her pain is staining pain it has increased since it started in November. She underwent a CT scan of the abdomen and pelvis on March 10. It reveals a moderate amount of ascites or peritoneal cavity. There are multiple soft tissue lesions noted throughout the peritoneal cavity consistent with peritoneal implants of tumor with omental caking as well. The abdominal structures are unremarkable. The pelvis she has ascites as well as multiple peritoneal implants of tumor. The prominent adnexal soft tissue masses bilaterally. On the left a soft tissue mass in the left adnexa measures 7 x 4 cm with a right adnexal soft tissue mass measuring 3.8 x 2.4 cm. She underwent a paracentesis on March 11. Pathology revealed malignant cells consistent with carcinoma. The malignant cells are positive for cytokeratin 7 and show a morphologic and immunophenotypic finding consistent with a gynecologic primary. CA 125 has been drawn that was over 1000.  The patient has a personal history of breast cancer. In 1993 she was diagnosed with a right-sided breast cancer. She was treated with mastectomy on the right and chemotherapy including doxorubicin. She does not require any radiation treatment. She reports she has limited followup. In 2003 show left-sided breast cancer that was early stage. She was treated with a left mastectomy and did not receive any adjuvant chemotherapy or radiation.  She never had any genetic testing.  She underwent expiratory laparotomy and  peritoneal biopsies on April 1. Operative findings included 3.7 L of ascites. 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 She complete coverage of the right hemidiaphragm a tumor. The transverse colon the colon and omentum were diffusely replaced with tumor that was not resectable. The left upper quadrant she agglutination tumor with about that had the appearance of the mass although there is no large abdominal pelvic mass in the left upper quadrant. There is diffuse subcentimeter disease coding almost the entire surface of the small bowel mesentery. The right ovary was agglutinated attached to the rectosigmoid colon with a thick rind of tumor connecting it. She was not deemed resectable. A biopsy was performed it revealed high-grade carcinoma. 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 She underwent 1 cycle of Taxotere and carboplatin based chemotherapy in the hospital.   After 3 cycles of chemotherapy her CA 125 went from 1097 to 54.9 on June 4. On May 27th she had a CT scan that revealed:  Findings: Lung bases: Bilateral mastectomy. Clear lung bases. Normal heart size without pericardial or pleural effusion.  Abdomen/pelvis: Normal liver, spleen, stomach, pancreas, gallbladder, biliary tract, adrenal glands. Left renal angiomyolipoma of 9 mm. Bilateral renal sinus cysts, without hydronephrosis. No retroperitoneal or retrocrural adenopathy. Sigmoid diverticulosis with muscular hypertrophy. Normal terminal ileum. Normal caliber of small bowel loops. Nearly completely resolved abdominal ascites. There is minimal fluid identified adjacent the spleen on image 25/series 2 and along the right lobe  of the liver on image 37/series 2. Slight decrease in omental nodularity/caking.  This is ill-defined, but measures on the order of 8.0 x 3.6 cm on image 45/series 2. No pelvic adenopathy. Normal urinary bladder. Similar cul-de-sac  small volume fluid, minimally eccentric left. Left adnexal mass measures 4.2 x 2.7 cm versus 7.0 x 4.0 cm on the prior. Right adnexal mass of 3.5 x 2.5 cm compares with 3.8 x 2.4 cm on the prior.  Bones/Musculoskeletal: Mild osteopenia. L4-S1 disc bulges.   IMPRESSION:  1. Overall response to therapy, with decreased omental/peritoneal disease and nearly completely resolved ascites.  2. Decreased size of bilateral ovarian/adnexal masses.  3. Similar small volume cul-de-sac fluid.  4. Similar left renal angiomyolipoma.  She had cycle #4 of chemotherapy on June 5. She's undergone the subsequent 3 cycles with carboplatin and Taxol. Her first cycle of chemotherapy was with carboplatin and Taxotere. They had discussed the results of the CT scan with Dr. Mariel Sleet. I last saw her on June 12. The plan was to continue with chemotherapy and she has had an additional 2 cycles of chemotherapy since I saw her with the last one being on September 20, 2012 under the care of Dr. Marcial Pacas sent again. She had a CT scan on July 28 that revealed:  The urinary bladder appears normal. Previous hysterectomy. Left adnexal soft tissue structure measures 2.2 x 4.0 cm, image 76/series 2. The right adnexal soft tissue structure measures 2.3 x 3.1 cm, image 76/series 2. Previously 2.6 x 3.5 cm. Loculated fluid within the inferior left pelvis measures 3.5 x 2.5 cm, image 79/series 2. Previously 3.7 x 2.7 cm. Omental tumor is identified measuring 7.5 x 3.0 cm, image 43/series 2. Previously 7.9 x 3.3 cm. The abdominal aorta has a normal caliber. No aneurysm. No upper abdominal adenopathy identified. There is no enlarged pelvic or inguinal adenopathy identified. The stomach is normal. The small bowel loops are within normal limits. No evidence for small bowel obstruction. Normal caliber of the colon multiple distal colonic diverticula identified without acute inflammation. Review of the visualized bony structures is significant for mild lumbar  spondylosis. No aggressive lytic or sclerotic bone lesions.  IMPRESSION:  1. No acute findings.  2. Lateral mild improvement and peritoneal disease. No new or progressive disease identified.  3. No evidence for obstructive uropathy or bowel obstruction.  Her CA 125 is normalized at 20.1.  Review of Systems: She said this last cycle of chemotherapy is still more difficult. She's had more nausea this cycle and is taking Zofran once or twice a day. She states that she has more neuropathy in her legs is mostly pain and weakness she states that her oncologist is aware. She denies a change about bladder habits other than having some diarrhea this week. She does feel like she has a viral illness. She does feel cold and has been afebrile. She denies any chest pain, shortness of breath. She denies any significant unintentional weight loss or weight gain.  Current Meds:  Outpatient Encounter Prescriptions as of 10/03/2012  Medication Sig Dispense Refill  . Alum & Mag Hydroxide-Simeth (MAGIC MOUTHWASH W/LIDOCAINE) SOLN Take 5 mLs by mouth 4 (four) times daily as needed.  240 mL  1  . amLODipine (NORVASC) 10 MG tablet Take 10 mg by mouth daily before breakfast.       . clotrimazole-betamethasone (LOTRISONE) cream Apply topically 2 (two) times daily.  30 g  0  . dexamethasone (DECADRON) 4 MG tablet 8mg  PO at 9pm before chemo, 8mg  at 3am the AM of chemo. Day after chemo, take  8mg  BIDx 2 days. Then Stop. Repeat with each cycle.  30 tablet  1  . docusate sodium (COLACE) 100 MG capsule Take 100 mg by mouth 2 (two) times daily. Taking 2 at bedtime      . gabapentin (NEURONTIN) 300 MG capsule Take 1 tablet Po x 3 dasy at HS, then 2 PO at HS x 3 days and then 3 tablets PO at HS thereafter  90 capsule  1  . lidocaine-prilocaine (EMLA) cream Apply topically as needed. Apply a quarter size amount to port site 1 hour prior to chemo. Do not rub in. Cover with plastic.  30 g  PRN  . LORazepam (ATIVAN) 0.5 MG tablet Take 1  tablet (0.5 mg total) by mouth every 3 (three) hours as needed (nausea/vomiting).  45 tablet  1  . magnesium hydroxide (MILK OF MAGNESIA) 400 MG/5ML suspension Take 30 mLs by mouth daily as needed for constipation.      Marland Kitchen nystatin (MYCOSTATIN) powder Apply topically 3 (three) times daily.  30 g  1  . ondansetron (ZOFRAN) 8 MG tablet Starting the day after chemo, take 1 tablet two times a day for 2 days. Then may take 1 tablet every 8 hours if needed for nausea/vomiting.  45 tablet  3  . oxyCODONE-acetaminophen (PERCOCET/ROXICET) 5-325 MG per tablet Take 1-2 tablets by mouth every 4 (four) hours as needed.  60 tablet  0  . potassium chloride SA (K-DUR,KLOR-CON) 20 MEQ tablet Take 1 tablet (20 mEq total) by mouth daily.  30 tablet  2   No facility-administered encounter medications on file as of 10/03/2012.    Allergy:  Allergies  Allergen Reactions  . Penicillins Anaphylaxis and Nausea And Vomiting  . Morphine And Related Nausea And Vomiting    Social Hx:   History   Social History  . Marital Status: Widowed    Spouse Name: N/A    Number of Children: N/A  . Years of Education: N/A   Occupational History  . Not on file.   Social History Main Topics  . Smoking status: Never Smoker   . Smokeless tobacco: Never Used  . Alcohol Use: No  . Drug Use: No  . Sexually Active: No   Other Topics Concern  . Not on file   Social History Narrative  . No narrative on file    Past Surgical Hx: Breast cancer surgery in 1993 and 2003. Bladder tacking in 1982 Past Surgical History  Procedure Laterality Date  . Thyroid lobectomy    . Foot surgery      2 spurs taken out of foot  . Varicose vein surgery    . Bladder tack surgery     . Tonsillectomy    . Mastectomy      bilateral   . Laparotomy Bilateral 06/05/2012    Procedure: EXPLORATORY LAPAROTOMY WITH PELVIC TUMOR BIOPSY;  Surgeon: Rejeana Brock A. Duard Brady, MD;  Location: [facility];  Service: Gynecology;  Laterality: Bilateral;  . Abdominal  hysterectomy  1980  . Vascular surgery      veins stripped from both legs  . Portacath placement Left 06/25/2012    Procedure: INSERTION PORT-A-CATH;  Surgeon: Dalia Heading, MD;  Location: [facility];  Service: General;  Laterality: Left;    Past Medical Hx:  Past Medical History  Diagnosis Date  . Hypertension   . Hyperlipemia   . Thyroid disease   . Asthma   . Cancer     [place]  . Breast cancer  s/p bilateral mastectomy [id number]  . Ovarian cancer 05/17/2012  . 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 Adnexal mass 05/16/2012  . Shortness of breath     with exertion   . Peripheral vascular disease     hx of DVT   . Kidney stones     x 2  . 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 She never underwent any genetic testing that she is aware of. Family History  Problem Relation Age of Onset  . Cancer Mother     throat cancer  . Hypertension Father   . Heart attack Brother     5 brothers  . Hypertension Sister     Vitals:  Blood pressure 132/80, pulse 88, temperature 98.4 F (36.9 C), temperature source Oral, resp. rate 18, height 5\' 3"  (1.6 m), weight 188 lb 6.4 oz (85.458 kg).  Physical Exam: Well-nourished well-developed female in no acute distress.  HEENT: Fever blister of left-sided lower lip.  Neck: Supple, no lymphadenopathy, no thyromegaly.  Cardiovascular: Regular rate and rhythm.  Lungs: Clear to auscultation bilaterally  Incision is well-healed. There is no fluid wave. Abdomen is soft his minimally tender to deep palpation in the mid abdomen just under the epigastrum. There is no hepatosplenomegaly.  Groins: No lymphadenopathy.  Extremities 1+ nonpitting edema equal bilaterally.  Pelvic: Bimanual examination reveals no masses or nodularity.   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 In the setting of her having breast cancer twice, I feel that this may be related to a BRCA mutation. The patient will require postoperative genetic testing as she has daughters. She does not feel that she could afford that at this time.   She appears to have fairly stable disease after an additional 2 cycles of chemotherapy. I spent over 45 minutes face to face time independent of her physical examination with the patient and her daughters reviewing the CT scan findings. I discussed with them that concern that she has developed platinum and taxine resistant disease. It is at this point that the discussed with me that she developed some chest tightness and redness with her last cycle of chemotherapy. They did not say that she had an allergic reaction at the infusion center below the nurses intimated this. It may be that she had a carboplatin reaction and this will need to be elucidated. However, the options discussed with them include if she does have platinum resistant disease there is most likely no significant role for proceeding with any kind of debulking surgery and noted to proceed with any than 3J approved medications for platinum refractory ovarian cancer including gemcitabine, liposomal doxorubicin and topotecan. We also discussed proceeding with a diagnostic laparoscopy in at the time of laparoscopy it appears that her tumor is resectable we could proceed with aggressive surgery. If her tumor is not resectable we could at temp to obtain a biopsy for, resistance testing and chemotherapy to be started as soon as possible. They had concerns regarding insurance coverage and chemotherapy resistance testing and we will try to explore this for them prior to surgery. There were told several times a day did not need to make a decision today in clinic. However the patient very much wanted to make a decision today and she is opting towards  definitive surgery with a diagnostic laparoscopy and debulking is feasible proceeding with that. She understands that she'll need chemotherapy after surgery. She is tentatively scheduled for surgery for August 26. We will need to ensure that she has recovered fully from her chemotherapy prior to that time. Their questions regarding this were answered to their satisfaction. They will call us if they have any other issues as they discussed this is a family.  [name] A., MD 10/03/2012, 5:17 PM

## 2012-10-30 NOTE — Interval H&P Note (Signed)
History and Physical Interval Note:  10/30/2012 11:40 AM  Rita Humphrey  has presented today for surgery, with the diagnosis of OVARIAN CANCER   The various methods of treatment have been discussed with the patient and family. After consideration of risks, benefits and other options for treatment, the patient has consented to  Procedure(s): LAPAROSCOPY DIAGNOSTIC (N/A) POSSIBLE BIOPSIES OR POSSIBLE TUMOR DEBULKING     (N/A) as a surgical intervention .  The patient's history has been reviewed, patient examined, no change in status, stable for surgery.  I have reviewed the patient's chart and labs.  Questions were answered to the patient's satisfaction.     [name]

## 2012-10-30 NOTE — Progress Notes (Signed)
Utilization review completed.  

## 2012-10-30 NOTE — Anesthesia Preprocedure Evaluation (Addendum)
Anesthesia Evaluation  Patient identified by MRN, date of birth, ID band Patient awake    Reviewed: Allergy & Precautions, H&P , NPO status , Patient's Chart, lab work & pertinent test results  Airway Mallampati: II TM Distance: >3 FB Neck ROM: Full    Dental  (+) Partial Upper and Partial Lower   Pulmonary asthma ,  breath sounds clear to auscultation  Pulmonary exam normal       Cardiovascular Exercise Tolerance: Good hypertension, Pt. on medications + Peripheral Vascular Disease negative cardio ROS  Rhythm:Regular Rate:Normal     Neuro/Psych  Neuromuscular disease negative psych ROS   GI/Hepatic negative GI ROS, Neg liver ROS,   Endo/Other  negative endocrine ROS  Renal/GU negative Renal ROS  negative genitourinary   Musculoskeletal negative musculoskeletal ROS (+)   Abdominal (+) + obese,   Peds negative pediatric ROS (+)  Hematology negative hematology ROS (+)   Anesthesia Other Findings   Reproductive/Obstetrics negative OB ROS                          Anesthesia Physical Anesthesia Plan  ASA: II  Anesthesia Plan: General   Post-op Pain Management:    Induction: Intravenous  Airway Management Planned: Oral ETT  Additional Equipment:   Intra-op Plan:   Post-operative Plan: Extubation in OR  Informed Consent: I have reviewed the patients History and Physical, chart, labs and discussed the procedure including the risks, benefits and alternatives for the proposed anesthesia with the patient or authorized representative who has indicated his/her understanding and acceptance.   Dental advisory given  Plan Discussed with: CRNA  Anesthesia Plan Comments:         Anesthesia Quick Evaluation

## 2012-10-30 NOTE — Op Note (Signed)
PATIENT: AMRAN MALTER DATE OF BIRTH: 07/24/1940 ENCOUNTER DATE: 10/30/12   Preop Diagnosis: Advanced stage ovarian cancer status post neoadjuvant chemotherapy.  Postoperative Diagnosis: Same, optimally debulked  Surgery: Diagnostic laparoscopy, exploratory laparotomy bilateral salpingo-oophorectomy, total omentectomy and radical tumor debulking. Mobilization of splenic and hepatic flexures.  Surgeons:  Bernita Buffy. Duard Brady, MD; Antionette Char, MD   Anesthesia: General   Anesthesiologist: Dr. Payton Doughty  Estimated blood loss: 50 ml  IVF:  1500 ml   Urine output:  300 ml   Complications: None   Pathology: Bilateral ovaries and fallopian tubes, omentum  Operative findings: At the time of laparoscopy there was evidence of omental disease. The diaphragmatic tumors were almost completely gone. The mesenteric disease was almost almost completely gone.  At the time of laparotomy there was a old seroma measuring approximately 4 cm. The ovaries bilaterally were fairly normal in appearance. There was tumor on the external surface of the left ovary that was adherent to the rectosigmoid colon. Within the omentum there was a tumor that agglutinated the transverse colon to the stomach. It measured approximately 7 cm in length and 5 cm in width. At the completion of her tumor debulking, there were small miliary disease measuring approximately 2-3 mm.  Procedure: The patient was identified in the preoperative holding area. Informed consent was signed on the chart. Patient was seen history was reviewed and exam was performed.   The patient was then taken to the operating room and placed in the supine position with SCD hose on. General anesthesia was then induced without difficulty. She was then placed in the dorsolithotomy position. The abdomen was prepped with chlor prep sponges per protocol. Perineum was prepped with Betadine. The vagina was prepped with Betadine a Foley catheter was inserted into the bladder  under sterile conditions.  The patient was then draped after the prep was dried. Timeout was performed the patient, procedure, antibiotic, allergy, and length of procedure. After confirming that OG tube was placed. A 5 mm stab incision was made 2 cm below the costal margin in the midclavicular line on the left side. Using an operative report the peritoneal cavity was entered. The abdomen was insufflated with CO2 gas. At no point to the inked abdominal pressure increased over 15 mm of mercury. The abdominal survey with the findings as above were identified. The decision was made to proceed with laparotomy. Antibiotics were ordered.   A vertical midline infraumbilical incision was and carried down to the underlying fascia using Bovie cautery. The fascia was scored in the fascial incision was extended superiorly and inferiorly using Bovie cautery. The rectus bellies were dissected off the overlying fascia. The peritoneum was tented and entered. The peritoneal incision was extended superiorly and inferiorly with visualization of the underlying peritoneal cavity. The Buchwalter self-retaining retractor was then placed. At the initial placement as well as at several points during the case the lateral blades were checked to ensure no significant pressure on the psoas bellies.  The small and large bowel were packed out of the way of the surgical field with moist laparotomy sponges and malleable retractors were attached to the Buchwalter. Our attention was drawn to mobilize and the right colon. It was adherent to the right pelvic sidewall. The retroperitoneum on the right side was opened. The ureter was identified in a window was made between the ovarian vessels and the ureter. The ovarian vessels were clamped x2 transected and suture ligated. Using the ligasure, the adhesive disease between the ovary and the  right fallopian tube to the remnant of the round ligament was cauterized and transected. A similar procedure  was performed on the patient's left side. However, the difference was that the rectosigmoid colon was densely adherent to left pelvic sidewall. Retroperitoneal spaces open. There was no significant amount of tumor identified.  After identifying of the pedicles were hemostatic. The rectosigmoid colon and the left colon were mobilized along the white line of Toldt. The splenic flexure was brought down by continuing to opening of the record peritoneal space. The spleen was identified and was no visible tumor on the spleen. After assuring the ovary or around the omentum contained the tumor we performed a similar procedure on the right side and mobilized the right colon. At this point the transverse colon was freed of adhesive attachments and was brought better into the field of view. D. 7 x 5 cm tumor was identified. There were dense adhesions of the tumor to the stomach and the rectosigmoid colon. Using careful dissection and pinpoint cautery were able to mobilize the tumor circumferentially. The short gastrics were then identified isolated and cauterized and ligated with the LigaSure. This allowed [place] to elevate the tumor off of the peritoneum. The adhesive disease of the omentum to the transverse colon was then taken down using a pinpoint cautery and careful dissection. The tumor was completely removed. The mesentery of the transverse colon was intact. The stomach pedicles were identified and were noted to be hemostatic. There is no cautery defects noted on the surface of the stomach along the greater curvature. The transverse colon was inspected and similarly there were no serosal or mucosal defects identified. The upper abdomen was irrigated. All pedicles were noted to be hemostatic. Attention was then drawn to the pelvis. There is a small amount of bleeding on the left pelvic sidewall where the rectosigmoid colon was dissected. A small piece of Gelfoam was placed for hemostasis. The pelvis was copiously  irrigated.  The subcutaneous of the 5 mm left upper quadrant port was closed with 4-0 Vicryl. Steri-Strips and benzoin were applied. The fascia was grasped and closed with running suture of #1 PDS.  20 mL of Exparel within 20 mL of normal saline was injected for postoperative pain control. The skin was closed using staples.  All instrument, suture, laparotomy, Ray-Tec, and needle counts were correct x2. The patient tolerated the procedure well and was taken recovery room in stable condition. This is [name] dictating an operative note on [facility]RSON.

## 2012-10-30 NOTE — Anesthesia Postprocedure Evaluation (Signed)
  Anesthesia Post-op Note  Patient: Rita Humphrey  Procedure(s) Performed: Procedure(s) (LRB): LAPAROSCOPY DIAGNOSTIC, EXPLORATORY LAPAROTOMY, BILATERAL SALPINGO-OOPHORECTOMY, TUMOR DEBULKING (N/A)  Patient Location: PACU  Anesthesia Type: General  Level of Consciousness: awake and alert   Airway and Oxygen Therapy: Patient Spontanous Breathing  Post-op Pain: mild  Post-op Assessment: Post-op Vital signs reviewed, Patient's Cardiovascular Status Stable, Respiratory Function Stable, Patent Airway and No signs of Nausea or vomiting  Last Vitals:  Filed Vitals:   10/30/12 1602  BP: 124/83  Pulse: 82  Temp: 36.3 C  Resp: 16    Post-op Vital Signs: stable   Complications: No apparent anesthesia complications

## 2012-10-31 ENCOUNTER — Encounter (HOSPITAL_COMMUNITY): Payer: Self-pay | Admitting: Gynecologic Oncology

## 2012-10-31 LAB — CBC
MCH: 32.3 pg (ref 26.0–34.0)
MCV: 95.8 fL (ref 78.0–100.0)
Platelets: 275 10*3/uL (ref 150–400)
RDW: 13.5 % (ref 11.5–15.5)
WBC: 14 10*3/uL — ABNORMAL HIGH (ref 4.0–10.5)

## 2012-10-31 LAB — BASIC METABOLIC PANEL
CO2: 24 mEq/L (ref 19–32)
Calcium: 9.4 mg/dL (ref 8.4–10.5)
Creatinine, Ser: 0.61 mg/dL (ref 0.50–1.10)
GFR calc non Af Amer: 88 mL/min — ABNORMAL LOW (ref >90)

## 2012-10-31 MED ORDER — AMLODIPINE BESYLATE 10 MG PO TABS
10.0000 mg | ORAL_TABLET | Freq: Every day | ORAL | Status: DC
  Administered 2012-10-31 – 2012-11-02 (×3): 10 mg via ORAL
  Filled 2012-10-31 (×4): qty 1

## 2012-10-31 MED ORDER — FAMOTIDINE 20 MG PO TABS
20.0000 mg | ORAL_TABLET | Freq: Two times a day (BID) | ORAL | Status: DC
  Administered 2012-10-31 – 2012-11-02 (×4): 20 mg via ORAL
  Filled 2012-10-31 (×5): qty 1

## 2012-10-31 NOTE — Progress Notes (Signed)
1 Day Post-Op Procedure(s) (LRB): LAPAROSCOPY DIAGNOSTIC, EXPLORATORY LAPAROTOMY, BILATERAL SALPINGO-OOPHORECTOMY, TUMOR DEBULKING (N/A)  Subjective: Patient reports doing well this am.  Complaining of a sore throat.  Episode of nausea yesterday afternoon but no nausea since that time.  Denies emesis.  Intermittent abdominal soreness reported with adequate pain relief with PCA use.  Denies chest pain, dyspnea, passing flatus, or having a bowel movement.    Objective: Vital signs in last 24 hours: Temp:  [97.4 F (36.3 C)-100 F (37.8 C)] 98.8 F (37.1 C) (08/27 0556) Pulse Rate:  [82-104] 97 (08/27 0556) Resp:  [14-25] 20 (08/27 0831) BP: (124-170)/(61-84) 145/70 mmHg (08/27 0556) SpO2:  [89 %-100 %] 97 % (08/27 0831) Weight:  [187 lb 9.6 oz (85.095 kg)] 187 lb 9.6 oz (85.095 kg) (08/26 1602)    Intake/Output from previous day: 08/26 0701 - 08/27 0700 In: 3837.5 [I.V.:3537.5; IV Piggyback:300] Out: 2700 [Urine:2700]  Physical Examination: General: alert, cooperative and no distress Resp: mildly diminished in the bases Cardio: regular rate and rhythm, S1, S2 normal, no murmur, click, rub or gallop GI: incision: midline incision with staples open to air, no erythema or drainage and abdomen soft, very faint bowel sounds, non-distended Extremities: extremities normal, atraumatic, no cyanosis or edema  Labs: WBC/Hgb/Hct/Plts:  14.0/14.0/41.5/275 (08/27 0410) BUN/Cr/glu/ALT/AST/amyl/lip:  10/0.61/--/--/--/--/-- (08/27 0410)  Assessment: 72 y.o. s/p Procedure(s): LAPAROSCOPY DIAGNOSTIC, EXPLORATORY LAPAROTOMY, BILATERAL SALPINGO-OOPHORECTOMY, TUMOR DEBULKING: stable Pain:  Pain is well-controlled on PCA.  Heme:  Hgb 14.0 and Hct 41.5 this am.  Stable post-operatively.  CV: Hypertension: stable at this time.  Current treatment:  amlodipine (Norvasc).  GI:  Tolerating po: No: NPO.  NG in place to intermittent suction.     FEN:  Na+ 132 this am.  Continue to monitor.  Patient  asymptomatic.  Prophylaxis: pharmacologic prophylaxis (with any of the following: enoxaparin (Lovenox) 40mg  SQ 2 hours prior to surgery then every day) and intermittent pneumatic compression boots.  Plan: Discontinue NG tube Sips of clear liquids Resume Norvasc Am Bmet Encourage ambulation, IS use, deep breathing, and coughing Continue post-operative plan of care per Dr. Tamela Oddi   LOS: 1 day    Eathel Pajak DEAL 10/31/2012, 8:49 AM

## 2012-10-31 NOTE — Care Management Note (Signed)
    Page 1 of 1   10/31/2012     10:22:04 AM   CARE MANAGEMENT NOTE 10/31/2012  Patient:  Rita Humphrey, Rita Humphrey   Account Number:  [id number]  Date Initiated:  10/31/2012  Documentation initiated by:  Lorenda Ishihara  Subjective/Objective Assessment:   72 yo female admitted s/p exploratory lap, BSO, omenectomy, and tumor debulking. PTA lived at home alone.     Action/Plan:   Home when stable   Anticipated DC Date:  11/02/2012   Anticipated DC Plan:  HOME/SELF CARE      DC Planning Services  CM consult      Choice offered to / List presented to:             Status of service:  Completed, signed off Medicare Important Message given?   (If response is "NO", the following Medicare IM given date fields will be blank) Date Medicare IM given:   Date Additional Medicare IM given:    Discharge Disposition:  HOME/SELF CARE  Per UR Regulation:  Reviewed for med. necessity/level of care/duration of stay  If discussed at Long Length of Stay Meetings, dates discussed:    Comments:

## 2012-11-01 ENCOUNTER — Encounter (HOSPITAL_COMMUNITY): Payer: Medicare Other

## 2012-11-01 LAB — BASIC METABOLIC PANEL
Calcium: 9.6 mg/dL (ref 8.4–10.5)
Chloride: 105 mEq/L (ref 96–112)
Creatinine, Ser: 0.57 mg/dL (ref 0.50–1.10)
GFR calc Af Amer: >90 mL/min (ref >90)
Sodium: 137 mEq/L (ref 135–145)

## 2012-11-01 MED ORDER — ENSURE COMPLETE PO LIQD
237.0000 mL | Freq: Two times a day (BID) | ORAL | Status: DC
  Administered 2012-11-01 – 2012-11-02 (×3): 237 mL via ORAL

## 2012-11-01 MED ORDER — ACETAMINOPHEN 325 MG PO TABS
650.0000 mg | ORAL_TABLET | Freq: Four times a day (QID) | ORAL | Status: DC
  Administered 2012-11-01 – 2012-11-02 (×3): 650 mg via ORAL
  Filled 2012-11-01 (×5): qty 2

## 2012-11-01 MED ORDER — IBUPROFEN 600 MG PO TABS
600.0000 mg | ORAL_TABLET | Freq: Four times a day (QID) | ORAL | Status: DC
  Administered 2012-11-01 – 2012-11-02 (×3): 600 mg via ORAL
  Filled 2012-11-01 (×6): qty 1

## 2012-11-01 NOTE — Progress Notes (Signed)
2 Days Post-Op Procedure(s) (LRB): LAPAROSCOPY DIAGNOSTIC, EXPLORATORY LAPAROTOMY, BILATERAL SALPINGO-OOPHORECTOMY, TUMOR DEBULKING (N/A)  Subjective: Patient reports doing well this am.  Sore throat improved.  Intermittent abdominal soreness reported with movement with adequate pain relief with PCA use.  Tolerating sips of clear liquids with no nausea or emesis.  Denies chest pain, dyspnea, passing flatus, or having a bowel movement.    Objective: Vital signs in last 24 hours: Temp:  [98.1 F (36.7 C)-98.9 F (37.2 C)] 98.9 F (37.2 C) (08/28 1000) Pulse Rate:  [78-102] 102 (08/28 1000) Resp:  [16-18] 18 (08/28 1000) BP: (106-147)/(66-80) 147/80 mmHg (08/28 1000) SpO2:  [94 %-97 %] 97 % (08/28 1000)    Intake/Output from previous day: 08/27 0701 - 08/28 0700 In: 3238.8 [P.O.:120; I.V.:3018.8; IV Piggyback:100] Out: 2025 [Urine:2025]  Physical Examination: General: alert, cooperative and no distress Resp: mildly diminished in the bases Cardio: regular rate and rhythm, S1, S2 normal, no murmur, click, rub or gallop GI: incision: midline incision with staples open to air, no erythema or drainage and abdomen soft, hypoactive bowel sounds, non-distended Extremities: extremities normal, atraumatic, no cyanosis or edema  Labs:   BUN/Cr/glu/ALT/AST/amyl/lip:  7/0.57/--/--/--/--/-- (08/28 0430)  Assessment: 72 y.o. s/p Procedure(s): LAPAROSCOPY DIAGNOSTIC, EXPLORATORY LAPAROTOMY, BILATERAL SALPINGO-OOPHORECTOMY, TUMOR DEBULKING: stable Pain:  Pain is well-controlled on PCA.  Heme:  Hgb 14.0 and Hct 41.5 10/31/12 am.  Stable post-operatively.  CV: Hypertension: stable at this time.  Current treatment:  amlodipine (Norvasc).  GI:  Tolerating po: Yes, sips of clear.  S/P NG tube removal on 10/31/12  FEN:  Na+ 132 10/31/12 am.  Resolved this am.  Prophylaxis: pharmacologic prophylaxis (with any of the following: enoxaparin (Lovenox) 40mg  SQ 2 hours prior to surgery then every day)  and intermittent pneumatic compression boots.  Plan: Advance diet to full liquids Saline lock IV and discontinue PCA Ensure between meals Encourage ambulation, IS use, deep breathing, and coughing Continue post-operative plan of care per Dr. Tamela Oddi   LOS: 2 days    Rita Humphrey DEAL 11/01/2012, 10:49 AM

## 2012-11-02 MED ORDER — BISACODYL 10 MG RE SUPP
10.0000 mg | Freq: Once | RECTAL | Status: AC
  Administered 2012-11-02: 10 mg via RECTAL
  Filled 2012-11-02: qty 1

## 2012-11-02 MED ORDER — ENOXAPARIN SODIUM 40 MG/0.4ML ~~LOC~~ SOLN: 40.0000 mg | SUBCUTANEOUS | Status: DC

## 2012-11-02 MED ORDER — OXYCODONE-ACETAMINOPHEN 5-325 MG PO TABS: 1.0000 | ORAL_TABLET | ORAL | Status: DC | PRN

## 2012-11-02 NOTE — Discharge Summary (Signed)
  Physician Discharge Summary  Patient ID: Rita Humphrey MRN: [id number] DOB/AGE: 72-11-1940 72 y.o.  Admit date: 10/30/2012 Discharge date: 11/02/2012  Admission Diagnoses: Ovarian cancer  Discharge Diagnoses:  Principal Problem:   Ovarian cancer  Discharged Condition:  The patient is in good condition and stable for discharge.   Hospital Course: On 10/30/2012, the patient underwent the following: Procedure(s):  LAPAROSCOPY DIAGNOSTIC, EXPLORATORY LAPAROTOMY, BILATERAL SALPINGO-OOPHORECTOMY, TUMOR DEBULKING.  The postoperative course was uneventful.  She was discharged to home on postoperative day 3 tolerating a regular diet and passing flatus.  Consults: None  Significant Diagnostic Studies: None  Treatments: surgery: see above  Discharge Exam: Blood pressure 110/73, pulse 91, temperature 98.1 F (36.7 C), temperature source Oral, resp. rate 18, height 5\' 3"  (1.6 m), weight 187 lb 9.6 oz (85.095 kg), SpO2 93.00%. General appearance: alert, cooperative and no distress Resp: clear to auscultation bilaterally Cardio: regular rate and rhythm, S1, S2 normal, no murmur, click, rub or gallop GI: soft, non-tender; bowel sounds normal; no masses,  no organomegaly Extremities: extremities normal, atraumatic, no cyanosis or edema Incision/Wound: Midline incision with staples clean, dry, and intact.  No erythema or drainage.  Disposition: 01-Home or Self Care   Future Appointments Provider Department Dept Phone   11/06/2012 1:00 PM Doylene Bode, NP [facility] [phone]   11/15/2012 1:45 PM [name], MD [facility]YN[facility] [phone]       Medication List    ASK your doctor about these medications       amLODipine 10 MG tablet  Commonly known as:  NORVASC  Take 10 mg by mouth daily before breakfast.     lidocaine-prilocaine cream  Commonly known as:  EMLA  Apply topically as needed. Apply a quarter size  amount to port site 1 hour prior to chemo. Do not rub in. Cover with plastic.     oxyCODONE-acetaminophen 5-325 MG per tablet  Commonly known as:  PERCOCET/ROXICET  Take 1-2 tablets by mouth every 4 (four) hours as needed for pain.     potassium chloride SA 20 MEQ tablet  Commonly known as:  K-DUR,KLOR-CON  Take 20 mEq by mouth daily.     VISINE ADVANCED RELIEF 0.05-0.1-1-1 % Soln  Generic drug:  Tetrahydroz-Dextran-PEG-Povid  Place 1 drop into both eyes daily as needed.     vitamin B-6 25 MG tablet  Commonly known as:  pyridOXINE  Take 100 mg by mouth 2 (two) times daily.           Follow-up Information   Follow up with [facility] A., MD On 11/15/2012. (at the [facility])    Specialty:  Gynecologic Oncology   Contact information:   [address] [place] [address] [phone]       Follow up with Ednamae Schiano, Georgiann Mohs, NP On 11/06/2012. (at 1pm for staple removal at the [facility])    Specialty:  Gynecologic Oncology   Contact information:   [address] [place] [address] [phone]       Greater than thirty minutes were spend for face to face discharge instructions and discharge orders/summary in EPIC.   Signed: Marquerite Forsman DEAL 11/02/2012, 3:34 PM

## 2012-11-02 NOTE — Progress Notes (Signed)
3 Days Post-Op Procedure(s) (LRB): LAPAROSCOPY DIAGNOSTIC, EXPLORATORY LAPAROTOMY, BILATERAL SALPINGO-OOPHORECTOMY, TUMOR DEBULKING (N/A)  Subjective: Patient reports doing well.  Adequate pain relief with oral pain medication use.  Tolerating full liquids with no nausea or emesis.  Denies chest pain, dyspnea, passing flatus, or having a bowel movement.  No concerns voiced.    Objective: Vital signs in last 24 hours: Temp:  [98 F (36.7 C)-99.5 F (37.5 C)] 98 F (36.7 C) (08/29 0537) Pulse Rate:  [87-115] 87 (08/29 0537) Resp:  [18-20] 18 (08/29 0537) BP: (130-160)/(75-84) 140/82 mmHg (08/29 0537) SpO2:  [90 %-97 %] 94 % (08/29 0537)    Intake/Output from previous day: 08/28 0701 - 08/29 0700 In: 1860 [P.O.:360; I.V.:1500] Out: 1000 [Urine:1000]  Physical Examination: General: alert, cooperative and no distress Resp: mildly diminished in the bases Cardio: regular rate and rhythm, S1, S2 normal, no murmur, click, rub or gallop GI: incision: midline incision with staples open to air, no erythema or drainage and abdomen soft, active bowel sounds, non-distended Extremities: extremities normal, atraumatic, no cyanosis or edema  Assessment: 72 y.o. s/p Procedure(s): LAPAROSCOPY DIAGNOSTIC, EXPLORATORY LAPAROTOMY, BILATERAL SALPINGO-OOPHORECTOMY, TUMOR DEBULKING: stable Pain:  Pain is well-controlled on oral medications.  Heme:  Hgb 14.0 and Hct 41.5 10/31/12 am.  Stable post-operatively.  CV: Hypertension: stable at this time.  Current treatment:  amlodipine (Norvasc).  GI:  Tolerating po: Yes, full liquids.  S/P NG tube removal on 10/31/12  FEN:  Na+ 132 10/31/12 am: resolved.  Prophylaxis: pharmacologic prophylaxis (with any of the following: enoxaparin (Lovenox) 40mg  SQ 2 hours prior to surgery then every day) and intermittent pneumatic compression boots.  Plan: Advance diet to regular diet Dulcolax suppository this am Ensure between meals Encourage ambulation, IS use,  deep breathing, and coughing Continue post-operative plan of care per Dr. Tamela Oddi Plan for discharge when +bowel movement or +flatus   LOS: 3 days    Crisol Muecke DEAL 11/02/2012, 8:42 AM

## 2012-11-02 NOTE — Progress Notes (Signed)
Discharge instructions and script provided to pt and daughter including lovenox teaching, wound site care and s/s of infection, and general care of self.  Teach back method used with verbal understanding and feedback.  MyChart denied.

## 2012-11-06 ENCOUNTER — Encounter: Payer: Self-pay | Admitting: Gynecologic Oncology

## 2012-11-06 ENCOUNTER — Ambulatory Visit: Payer: Medicare Other | Attending: Gynecologic Oncology | Admitting: Gynecologic Oncology

## 2012-11-06 VITALS — BP 136/84 | HR 102 | Temp 98.2°F | Resp 16 | Ht 63.0 in | Wt 181.7 lb

## 2012-11-06 DIAGNOSIS — C569 Malignant neoplasm of unspecified ovary: Secondary | ICD-10-CM

## 2012-11-06 MED ORDER — ENOXAPARIN SODIUM 40 MG/0.4ML ~~LOC~~ SOLN: 40.0000 mg | SUBCUTANEOUS | Status: DC

## 2012-11-06 NOTE — Progress Notes (Signed)
Follow Up Note: Gyn-Onc  Rita Humphrey 72 y.o. female  CC:  Chief Complaint  Patient presents with  . Ovarian Cancer    Follow up    HPI: Rita Humphrey is a 72 year old, gravida 3 para 3, who had been experiencing abdominal pain since November of 2013. Since December, she had lost approximately 20 pounds and during that time, also noted early satiety and not be able to wear her clothes. She did not seek medical attention until February 2014.  She underwent a CT scan of the abdomen and pelvis on March 10. It reveals a moderate amount of ascites or peritoneal cavity. There are multiple soft tissue lesions noted throughout the peritoneal cavity consistent with peritoneal implants of tumor with omental caking as well. The abdominal structures are unremarkable. The pelvis has ascites as well as multiple peritoneal implants of tumor. The prominent adnexal soft tissue masses bilaterally. On the left, a soft tissue mass in the left adnexa measures 7 x 4 cm with a right adnexal soft tissue mass measuring 3.8 x 2.4 cm. She underwent a paracentesis on March 11. Pathology revealed malignant cells consistent with carcinoma. The malignant cells are positive for cytokeratin 7 and show a morphologic and immunophenotypic finding consistent with a gynecologic primary.  CA 125 has been drawn that was over 1000.  Further noted, the patient has a personal history of breast cancer and has never received genetic testing.   She underwent exploratory laparotomy and peritoneal biopsies on June 05, 2012. Operative findings included 3.7 L of ascites along with diffuse carcinomatosis involving the small bowel mesentery with agglutination of the small bowel mesentery.  There was complete coverage of the right hemidiaphragm with tumor. The transverse colon and omentum were diffusely replaced with tumor that was not resectable. The left upper quadrant was  agglutinated with tumor and there is no large abdominal pelvic mass in the left upper quadrant. There is diffuse subcentimeter disease covering almost the entire surface of the small bowel mesentery. The right ovary was agglutinated to the rectosigmoid colon with a thick rind of tumor connecting it. She was not deemed resectable.  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 She underwent 1 cycle of Taxotere and carboplatin based chemotherapy in the hospital.   After 3 cycles of chemotherapy, her CA 125 went from 1097 to 54.9 on June 4.  On May 27th she had a CT scan that revealed: Findings: Lung bases: Bilateral mastectomy. Clear lung bases. Normal heart size without pericardial or pleural effusion. Abdomen/pelvis: Normal liver, spleen, stomach, pancreas, gallbladder, biliary tract, adrenal glands. Left renal angiomyolipoma of 9 mm. Bilateral renal sinus cysts, without hydronephrosis. No retroperitoneal or retrocrural adenopathy. Sigmoid diverticulosis with muscular hypertrophy. Normal terminal ileum. Normal caliber of small bowel loops. Nearly completely resolved abdominal ascites. There is minimal fluid identified adjacent the spleen on image 25/series 2 and along the right lobe of the liver on image 37/series 2. Slight decrease in omental nodularity/caking. This is ill-defined, but measures on the order of 8.0 x 3.6 cm on image 45/series 2. No pelvic adenopathy. Normal urinary bladder. Similar cul-de-sac small volume fluid, minimally eccentric left. Left adnexal mass measures 4.2 x 2.7 cm versus 7.0 x 4.0 cm on the prior. Right adnexal mass of 3.5 x 2.5 cm compares with 3.8 x 2.4 cm on the prior. Bones/Musculoskeletal: Mild osteopenia. L4-S1 disc bulges. IMPRESSION: 1. Overall response to therapy, with decreased omental/peritoneal disease and nearly completely resolved  ascites.  2. Decreased size of bilateral ovarian/adnexal masses.  3.  Similar small volume cul-de-sac fluid. 4. Similar left renal angiomyolipoma.   She had cycle #4 of chemotherapy on June 5. She's undergone the subsequent 3 cycles with carboplatin and Taxol. Her first cycle of chemotherapy was with carboplatin and Taxotere.  She had a CT scan on July 28 that revealed: The urinary bladder appears normal. Previous hysterectomy. Left adnexal soft tissue structure measures 2.2 x 4.0 cm, image 76/series 2. The right adnexal soft tissue structure measures 2.3 x 3.1 cm, image 76/series 2. Previously 2.6 x 3.5 cm. Loculated fluid within the inferior left pelvis measures 3.5 x 2.5 cm, image 79/series 2. Previously 3.7 x 2.7 cm. Omental tumor is identified measuring 7.5 x 3.0 cm, image 43/series 2. Previously 7.9 x 3.3 cm. The abdominal aorta has a normal caliber. No aneurysm. No upper abdominal adenopathy identified. There is no enlarged pelvic or inguinal adenopathy identified. The stomach is normal. The small bowel loops are within normal limits. No evidence for small bowel obstruction. Normal caliber of the colon multiple distal colonic diverticula identified without acute inflammation. Review of the visualized bony structures is significant for mild lumbar spondylosis. No aggressive lytic or sclerotic bone lesions. IMPRESSION: 1. No acute findings. 2. Lateral mild improvement and peritoneal disease. No new or progressive disease identified.  3. No evidence for obstructive uropathy or bowel obstruction. Her CA 125 is normalized at 20.1.  On 10/30/12, she underwent a diagnostic laparoscopy, exploratory laparotomy, bilateral salpingo-oophorectomy, total omentectomy, and radical tumor debulking along with mobilization of splenic and hepatic flexures by Dr. [name].  Final pathology revealed: 1. Adnexa - ovary +/- tube, neoplastic, left - HIGH GRADE CARCINOMA, 2.3 CM, INVOLVING THE ADJACENT FALLOPIAN TUBE. - PLEASE SEE ONCOLOGY TEMPLATE FOR DETAILS.  2. Adnexa - ovary +/- tube,  neoplastic, right - RESIDUAL HIGH GRADE CARCINOMA IN A BACKGROUND OF ENDOSALPINGIOSIS, 2.5 CM, INVOLVING THE OVARIAN CAPSULE - BENIGN FALLOPIAN TUBAL TISSUE WITH PARATUBAL CYSTS. 3. 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  Interval History:  She presents today for post-operative follow up and staple removal.  She reports doing well since surgery.  Patient describes expected post operative status.  Adequate PO intake reported.  Bowels and bladder functioning without difficulty.  Pain minimal.  She is ambulating without difficulty.  She denies nausea, vomiting, severe abdominal pain/swelling, or incisional drainage or erythema.  Review of Systems: Constitutional: Feels well.  No fever, chills, early satiety, change in appetite, unintentional weight loss or gain, or fatigue.  Cardiovascular: No chest pain, shortness of breath, or edema.  Pulmonary: No cough or wheeze.  Gastrointestinal: No nausea, vomiting, or diarrhea. No bright red blood per rectum or change in bowel movement.  Genitourinary: No frequency, urgency, or dysuria. No vaginal bleeding or discharge.  Musculoskeletal: No myalgia or joint pain. Neurologic: No weakness, numbness, or change in gait.  Psychology: No depression, anxiety, or insomnia.  Current Meds:  Outpatient Encounter Prescriptions as of 11/06/2012  Medication Sig Dispense Refill  . amLODipine (NORVASC) 10 MG tablet Take 10 mg by mouth daily before breakfast.       . enoxaparin (LOVENOX) 40 MG/0.4ML injection Inject 0.4 mLs (40 mg total) into the skin daily.  21 Syringe  0  . lidocaine-prilocaine (EMLA) cream Apply topically as needed. Apply a quarter size amount to port site 1 hour prior to chemo. Do not rub in. Cover with plastic.  30 g  PRN  .  oxyCODONE-acetaminophen (PERCOCET/ROXICET) 5-325 MG per tablet Take 1-2 tablets by mouth every 4 (four) hours as needed (moderate to severe pain).  60 tablet  0  . potassium chloride SA  (K-DUR,KLOR-CON) 20 MEQ tablet Take 20 mEq by mouth daily.      Rocco Pauls (VISINE ADVANCED RELIEF) 0.05-0.1-1-1 % SOLN Place 1 drop into both eyes daily as needed.      . vitamin B-6 (PYRIDOXINE) 25 MG tablet Take 100 mg by mouth 2 (two) times daily.      . [DISCONTINUED] enoxaparin (LOVENOX) 40 MG/0.4ML injection Inject 0.4 mLs (40 mg total) into the skin daily.  25 Syringe  0   No facility-administered encounter medications on file as of 11/06/2012.    Allergy:  Allergies  Allergen Reactions  . Penicillins Anaphylaxis and Nausea And Vomiting  . Morphine And Related Nausea And Vomiting    Social Hx:   History   Social History  . Marital Status: Widowed    Spouse Name: N/A    Number of Children: N/A  . Years of Education: N/A   Occupational History  . Not on file.   Social History Main Topics  . Smoking status: Never Smoker   . Smokeless tobacco: Never Used  . Alcohol Use: No  . Drug Use: No  . Sexual Activity: No   Other Topics Concern  . Not on file   Social History Narrative  . No narrative on file    Past Surgical Hx:  Past Surgical History  Procedure Laterality Date  . Thyroid lobectomy    . Foot surgery      2 spurs taken out of foot  . Varicose vein surgery    . Bladder tack surgery     . Tonsillectomy    . Mastectomy      bilateral   . Laparotomy Bilateral 06/05/2012    Procedure: EXPLORATORY LAPAROTOMY WITH PELVIC TUMOR BIOPSY;  Surgeon: Rejeana Brock A. Duard Brady, MD;  Location: [facility];  Service: Gynecology;  Laterality: Bilateral;  . Abdominal hysterectomy  1980  . Vascular surgery      veins stripped from both legs  . Portacath placement Left 06/25/2012    Procedure: INSERTION PORT-A-CATH;  Surgeon: Dalia Heading, MD;  Location: [facility];  Service: General;  Laterality: Left;  . Laparoscopy N/A 10/30/2012    Procedure: LAPAROSCOPY DIAGNOSTIC, EXPLORATORY LAPAROTOMY, BILATERAL SALPINGO-OOPHORECTOMY, TUMOR DEBULKING;  Surgeon: Rejeana Brock A. Duard Brady,  MD;  Location: [facility];  Service: Gynecology;  Laterality: N/A;    Past Medical Hx:  Past Medical History  Diagnosis Date  . Hypertension   . Hyperlipemia   . Thyroid disease   . Asthma   . Adnexal mass 05/16/2012  . 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 Cancer     [place]  . Breast cancer     s/p bilateral mastectomy [id number]  . Ovarian cancer 05/17/2012  . 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 Peripheral vascular disease 2013    hx of DVT right leg  . History of kidney stones   . Anemia   . Platelets decreased april 2014     1 transfusion given before chemo  . Deafness in left ear   . Numbness in feet   . Numbness of fingers of both hands   . Dizziness     occasional    Family Hx:  Family History  Problem Relation Age of Onset  . Cancer Mother  throat cancer  . Hypertension Father   . Heart attack Brother     5 brothers  . Hypertension Sister     Vitals:  Blood pressure 136/84, pulse 102, temperature 98.2 F (36.8 C), temperature source Oral, resp. rate 16, height 5\' 3"  (1.6 m), weight 181 lb 11.2 oz (82.419 kg), SpO2 100.00%.  Physical Exam:  General: Well developed, well nourished female in no acute distress. Alert and oriented x 3.  Cardiovascular: Regular rate and rhythm. S1 and S2 normal.  Lungs: Clear to auscultation bilaterally. No wheezes/crackles/rhonchi noted.  Skin: No rashes or lesions present. Back: No CVA tenderness.  Abdomen: Abdomen soft, non-tender and obese. Active bowel sounds in all quadrants. 28 staples removed from the midline incision without difficulty.  No erythema noted.  Trace amount of serosanguinous drainage noted from open staple insertion site.  No evidence of superficial incisional opening or fluid collection.  No fluid expressed with light palpation.  1/2 inch steri strips applied.   Extremities: No bilateral cyanosis or clubbing.  Mild, non-pitting edema noted bilaterally.    Assessment/Plan:   72 year old, gravida 3 para 3, s/p diagnostic laparoscopy, exploratory laparotomy, bilateral salpingo-oophorectomy, total omentectomy, and radical tumor debulking along with mobilization of splenic and hepatic flexures by Dr. [name] on 10/30/12.  Final pathology revealed: 1. Adnexa - ovary +/- tube, neoplastic, left - HIGH GRADE CARCINOMA, 2.3 CM, INVOLVING THE ADJACENT FALLOPIAN TUBE. - PLEASE SEE ONCOLOGY TEMPLATE FOR DETAILS.  2. 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 3. 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 She is doing well post-operatively.  She has a follow up appointment with Dr. Duard Brady on September 11.  Incision care reinforced.  Reportable signs and symptoms reviewed. The patient will require postoperative genetic testing as she has daughters per Dr. Denman George recommendations.  She is advised to call for any questions or concerns.   CROSS, [name], NP 11/06/2012, 2:37 PM

## 2012-11-15 ENCOUNTER — Ambulatory Visit: Payer: Medicare Other | Attending: Gynecologic Oncology | Admitting: Gynecologic Oncology

## 2012-11-15 ENCOUNTER — Encounter: Payer: Self-pay | Admitting: Gynecologic Oncology

## 2012-11-15 VITALS — BP 138/82 | HR 92 | Temp 98.9°F | Resp 18 | Ht 63.0 in | Wt 180.0 lb

## 2012-11-15 DIAGNOSIS — M899 Disorder of bone, unspecified: Secondary | ICD-10-CM | POA: Insufficient documentation

## 2012-11-15 DIAGNOSIS — Z86718 Personal history of other venous thrombosis and embolism: Secondary | ICD-10-CM | POA: Insufficient documentation

## 2012-11-15 DIAGNOSIS — C569 Malignant neoplasm of unspecified ovary: Secondary | ICD-10-CM | POA: Insufficient documentation

## 2012-11-15 DIAGNOSIS — M47817 Spondylosis without myelopathy or radiculopathy, lumbosacral region: Secondary | ICD-10-CM | POA: Insufficient documentation

## 2012-11-15 DIAGNOSIS — K5730 Diverticulosis of large intestine without perforation or abscess without bleeding: Secondary | ICD-10-CM | POA: Insufficient documentation

## 2012-11-15 DIAGNOSIS — N281 Cyst of kidney, acquired: Secondary | ICD-10-CM | POA: Insufficient documentation

## 2012-11-15 DIAGNOSIS — D3000 Benign neoplasm of unspecified kidney: Secondary | ICD-10-CM | POA: Insufficient documentation

## 2012-11-15 DIAGNOSIS — C786 Secondary malignant neoplasm of retroperitoneum and peritoneum: Secondary | ICD-10-CM | POA: Insufficient documentation

## 2012-11-15 DIAGNOSIS — Z7901 Long term (current) use of anticoagulants: Secondary | ICD-10-CM | POA: Insufficient documentation

## 2012-11-15 DIAGNOSIS — Z90710 Acquired absence of both cervix and uterus: Secondary | ICD-10-CM | POA: Insufficient documentation

## 2012-11-15 DIAGNOSIS — Z9221 Personal history of antineoplastic chemotherapy: Secondary | ICD-10-CM | POA: Insufficient documentation

## 2012-11-15 DIAGNOSIS — Z9010 Acquired absence of unspecified breast and nipple: Secondary | ICD-10-CM | POA: Insufficient documentation

## 2012-11-15 DIAGNOSIS — G620 Drug-induced polyneuropathy: Secondary | ICD-10-CM | POA: Insufficient documentation

## 2012-11-15 DIAGNOSIS — I10 Essential (primary) hypertension: Secondary | ICD-10-CM | POA: Insufficient documentation

## 2012-11-15 DIAGNOSIS — Z79899 Other long term (current) drug therapy: Secondary | ICD-10-CM | POA: Insufficient documentation

## 2012-11-15 DIAGNOSIS — T451X5A Adverse effect of antineoplastic and immunosuppressive drugs, initial encounter: Secondary | ICD-10-CM | POA: Insufficient documentation

## 2012-11-15 DIAGNOSIS — Z853 Personal history of malignant neoplasm of breast: Secondary | ICD-10-CM | POA: Insufficient documentation

## 2012-11-15 NOTE — Progress Notes (Signed)
Consult Note: Gyn-Onc  Rita Humphrey 72 y.o. female  CC:  Chief Complaint  Patient presents with  . Ovarian Cancer    Follow up    HPI: Rita Humphrey is a 72 year old, gravida 3 para 3, who had been experiencing abdominal pain since November of 2013. Since December, she had lost approximately 20 pounds and during that time, also noted early satiety and not be able to wear her clothes. She did not seek medical attention until February 2014. She underwent a CT scan of the abdomen and pelvis on March 10. It reveals a moderate amount of ascites or peritoneal cavity. There are multiple soft tissue lesions noted throughout the peritoneal cavity consistent with peritoneal implants of tumor with omental caking as well. The abdominal structures are unremarkable. The pelvis has ascites as well as multiple peritoneal implants of tumor. The prominent adnexal soft tissue masses bilaterally. On the left, a soft tissue mass in the left adnexa measures 7 x 4 cm with a right adnexal soft tissue mass measuring 3.8 x 2.4 cm. She underwent a paracentesis on March 11. Pathology revealed malignant cells consistent with carcinoma. The malignant cells are positive for cytokeratin 7 and show a morphologic and immunophenotypic finding consistent with a gynecologic primary. CA 125 has been drawn that was over 1000. Further noted, the patient has a personal history of breast cancer and has never received genetic testing.   She underwent exploratory laparotomy and peritoneal biopsies on June 05, 2012. Operative findings included 3.7 L of ascites along with diffuse carcinomatosis involving the small bowel mesentery with agglutination of the small bowel mesentery. There was complete coverage of the right hemidiaphragm with tumor. The transverse colon and omentum were diffusely replaced with tumor that was not resectable. The left upper quadrant was agglutinated with tumor and there is no large abdominal pelvic mass in the left upper  quadrant. There is diffuse subcentimeter disease covering almost the entire surface of the small bowel mesentery. The right ovary was agglutinated to the rectosigmoid colon with a thick rind of tumor connecting it. She was not deemed resectable. 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 She underwent 1 cycle of Taxotere and carboplatin based chemotherapy in the hospital.   After 3 cycles of chemotherapy, her CA 125 went from 1097 to 54.9 on June 4. On May 27th she had a CT scan that revealed: Findings: Lung bases: Bilateral mastectomy. Clear lung bases. Normal heart size without pericardial or pleural effusion. Abdomen/pelvis: Normal liver, spleen, stomach, pancreas, gallbladder, biliary tract, adrenal glands. Left renal angiomyolipoma of 9 mm. Bilateral renal sinus cysts, without hydronephrosis. No retroperitoneal or retrocrural adenopathy. Sigmoid diverticulosis with muscular hypertrophy. Normal terminal ileum. Normal caliber of small bowel loops. Nearly completely resolved abdominal ascites. There is minimal fluid identified adjacent the spleen on image 25/series 2 and along the right lobe of the liver on image 37/series 2. Slight decrease in omental nodularity/caking. This is ill-defined, but measures on the order of 8.0 x 3.6 cm on image 45/series 2. No pelvic adenopathy. Normal urinary bladder. Similar cul-de-sac small volume fluid, minimally eccentric left. Left adnexal mass measures 4.2 x 2.7 cm versus 7.0 x 4.0 cm on the prior. Right adnexal mass of 3.5 x 2.5 cm compares with 3.8 x 2.4 cm on the prior. Bones/Musculoskeletal: Mild osteopenia. L4-S1 disc bulges. IMPRESSION: 1. Overall response to therapy, with decreased omental/peritoneal disease and nearly completely resolved ascites. 2. Decreased size of bilateral ovarian/adnexal  masses. 3. Similar small volume cul-de-sac fluid. 4. Similar left renal angiomyolipoma.   She had  cycle #4 of chemotherapy on June 5. She's undergone the subsequent 3 cycles with carboplatin and Taxol. Her first cycle of chemotherapy was with carboplatin and Taxotere. She had a CT scan on July 28 that revealed: The urinary bladder appears normal. Previous hysterectomy. Left adnexal soft tissue structure measures 2.2 x 4.0 cm, image 76/series 2. The right adnexal soft tissue structure measures 2.3 x 3.1 cm, image 76/series 2. Previously 2.6 x 3.5 cm. Loculated fluid within the inferior left pelvis measures 3.5 x 2.5 cm, image 79/series 2. Previously 3.7 x 2.7 cm. Omental tumor is identified measuring 7.5 x 3.0 cm, image 43/series 2. Previously 7.9 x 3.3 cm. The abdominal aorta has a normal caliber. No aneurysm. No upper abdominal adenopathy identified. There is no enlarged pelvic or inguinal adenopathy identified. The stomach is normal. The small bowel loops are within normal limits. No evidence for small bowel obstruction. Normal caliber of the colon multiple distal colonic diverticula identified without acute inflammation. Review of the visualized bony structures is significant for mild lumbar spondylosis. No aggressive lytic or sclerotic bone lesions. IMPRESSION: 1. No acute findings. 2. Lateral mild improvement and peritoneal disease. No new or progressive disease identified. 3. No evidence for obstructive uropathy or bowel obstruction. Her CA 125 is normalized at 20.1.    On 10/30/12, she underwent a diagnostic laparoscopy, exploratory laparotomy, bilateral salpingo-oophorectomy, total omentectomy, and radical tumor debulking along with mobilization of splenic and hepatic flexures by Dr. [name]. Final pathology revealed: 1. Adnexa - ovary +/- tube, neoplastic, left - HIGH GRADE CARCINOMA, 2.3 CM, INVOLVING THE ADJACENT FALLOPIAN TUBE. - PLEASE SEE ONCOLOGY TEMPLATE FOR DETAILS. 2. 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 3. 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  Interval History:  She is recovering from surgery well. She is eating well normal bowel and bladder function. Normal not pain control. 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   Current Meds:  Outpatient Encounter Prescriptions as of 11/15/2012  Medication Sig Dispense Refill  . amLODipine (NORVASC) 10 MG tablet Take 10 mg by mouth daily before breakfast.       . enoxaparin (LOVENOX) 40 MG/0.4ML injection Inject 0.4 mLs (40 mg total) into the skin daily.  21 Syringe  0  . lidocaine-prilocaine (EMLA) cream Apply topically as needed. Apply a quarter size amount to port site 1 hour prior to chemo. Do not rub in. Cover with plastic.  30 g  PRN  . oxyCODONE-acetaminophen (PERCOCET/ROXICET) 5-325 MG per tablet Take 1-2 tablets by mouth every 4 (four) hours as needed (moderate to severe pain).  60 tablet  0  . potassium chloride SA (K-DUR,KLOR-CON) 20 MEQ tablet Take 20 mEq by mouth daily.      Rocco Pauls (VISINE ADVANCED RELIEF) 0.05-0.1-1-1 % SOLN Place 1 drop into both eyes daily as needed.      . vitamin B-6 (PYRIDOXINE) 25 MG tablet Take 100 mg by mouth 2 (two) times daily.       No facility-administered encounter medications on file as of 11/15/2012.    Allergy:  Allergies  Allergen Reactions  . Penicillins Anaphylaxis and Nausea And Vomiting  . Morphine And Related Nausea And Vomiting    Social Hx:   History  Social History  . Marital Status: Widowed    Spouse Name: N/A    Number of Children: N/A  . Years of Education: N/A   Occupational History  . Not on file.   Social History Main Topics  . Smoking status: Never Smoker   . Smokeless tobacco: Never Used  . Alcohol Use: No  . Drug Use: No  . Sexual Activity: No   Other Topics Concern  . Not on file   Social History Narrative  . No  narrative on file    Past Surgical Hx:  Past Surgical History  Procedure Laterality Date  . Thyroid lobectomy    . Foot surgery      2 spurs taken out of foot  . Varicose vein surgery    . Bladder tack surgery     . Tonsillectomy    . Mastectomy      bilateral   . Laparotomy Bilateral 06/05/2012    Procedure: EXPLORATORY LAPAROTOMY WITH PELVIC TUMOR BIOPSY;  Surgeon: Rejeana Brock A. Duard Brady, MD;  Location: [facility];  Service: Gynecology;  Laterality: Bilateral;  . Abdominal hysterectomy  1980  . Vascular surgery      veins stripped from both legs  . Portacath placement Left 06/25/2012    Procedure: INSERTION PORT-A-CATH;  Surgeon: Dalia Heading, MD;  Location: [facility];  Service: General;  Laterality: Left;  . Laparoscopy N/A 10/30/2012    Procedure: LAPAROSCOPY DIAGNOSTIC, EXPLORATORY LAPAROTOMY, BILATERAL SALPINGO-OOPHORECTOMY, TUMOR DEBULKING;  Surgeon: Rejeana Brock A. Duard Brady, MD;  Location: [facility];  Service: Gynecology;  Laterality: N/A;    Past Medical Hx:  Past Medical History  Diagnosis Date  . Hypertension   . Hyperlipemia   . Thyroid disease   . Asthma   . Adnexal mass 05/16/2012  . 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 Cancer     [place]  . Breast cancer     s/p bilateral mastectomy [id number]  . Ovarian cancer 05/17/2012  . 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 Peripheral vascular disease 2013    hx of DVT right leg  . History of kidney stones   . Anemia   . Platelets decreased april 2014     1 transfusion given before chemo  . Deafness in left ear   . Numbness in feet   . Numbness of fingers of both hands   . Dizziness     occasional    Oncology Hx:    Ovarian cancer   05/15/2012 Initial Diagnosis [facility] ovarian cancer   06/05/2012 Surgery suboptimal debulking    - 10/15/2012 Chemotherapy 7 cycles of platinum with taxane   10/30/2012 Surgery exploratory laparotomy, optimal debulking. Tumor sent to [facility]     Family Hx:  Family History  Problem Relation Age of Onset  . Cancer Mother     throat cancer  . Hypertension Father   . Heart attack Brother     5 brothers  . Hypertension Sister     Vitals:  Blood pressure 138/82, pulse 92, temperature 98.9 F (37.2 C), temperature source Oral, resp. rate 18, height 5\' 3"  (1.6 m), weight 180 lb (81.647 kg).  Physical Exam: Well-nourished well-developed female in no acute distress.  Abdomen: Well-healed vertical midline incision. Some ecchymosis status post Lovenox injections. Abdomen is soft and nontender.  Assessment/Plan: 72 year old status post neoadjuvant chemotherapy and optimal cytoreduction after 7 cycles of taxanes and platinum based chemotherapy. She still had residual disease of high-grade  carcinoma in a background of hydrosalpinges cyst within the right tube and ovary. She also high-grade metastatic cancer within the omentum. Her tumor was sent for [facility]. We have called pathology and they do not have the results available yet. We will contact [facility]. I believe we should start chemotherapy around the week of September October 29 6. By that point I think she will have recovered sufficiently from surgery. We will call her daughter Carlene at [phone] or [phone] with the results of the testing. She would like to continue receiving her therapy where she previously had it.  Orbin Mayeux A., MD 11/15/2012, 1:35 PM

## 2012-11-20 ENCOUNTER — Telehealth: Payer: Self-pay | Admitting: Gynecologic Oncology

## 2012-11-20 NOTE — Telephone Encounter (Signed)
Would recommend based on chemofx assay single agent carboplatin x3 cycles. PG

## 2012-11-21 ENCOUNTER — Telehealth: Payer: Self-pay | Admitting: Gynecologic Oncology

## 2012-11-21 ENCOUNTER — Other Ambulatory Visit (HOSPITAL_COMMUNITY): Payer: Self-pay | Admitting: Oncology

## 2012-11-21 NOTE — Telephone Encounter (Signed)
Informed of Chemo Fx testing results and upcoming appt at [facility] for Oct 1.  Verbalizing understanding.

## 2012-12-05 ENCOUNTER — Encounter (HOSPITAL_COMMUNITY): Payer: Medicare Other | Attending: Oncology

## 2012-12-05 ENCOUNTER — Encounter (HOSPITAL_COMMUNITY): Payer: Self-pay

## 2012-12-05 VITALS — BP 109/77 | HR 80 | Temp 98.2°F | Resp 18 | Wt 186.6 lb

## 2012-12-05 DIAGNOSIS — G622 Polyneuropathy due to other toxic agents: Secondary | ICD-10-CM | POA: Insufficient documentation

## 2012-12-05 DIAGNOSIS — C786 Secondary malignant neoplasm of retroperitoneum and peritoneum: Secondary | ICD-10-CM | POA: Insufficient documentation

## 2012-12-05 DIAGNOSIS — R109 Unspecified abdominal pain: Secondary | ICD-10-CM | POA: Insufficient documentation

## 2012-12-05 DIAGNOSIS — N9489 Other specified conditions associated with female genital organs and menstrual cycle: Secondary | ICD-10-CM | POA: Insufficient documentation

## 2012-12-05 DIAGNOSIS — Z23 Encounter for immunization: Secondary | ICD-10-CM

## 2012-12-05 DIAGNOSIS — C569 Malignant neoplasm of unspecified ovary: Secondary | ICD-10-CM | POA: Insufficient documentation

## 2012-12-05 DIAGNOSIS — C50919 Malignant neoplasm of unspecified site of unspecified female breast: Secondary | ICD-10-CM | POA: Insufficient documentation

## 2012-12-05 DIAGNOSIS — I10 Essential (primary) hypertension: Secondary | ICD-10-CM | POA: Insufficient documentation

## 2012-12-05 DIAGNOSIS — C801 Malignant (primary) neoplasm, unspecified: Secondary | ICD-10-CM | POA: Insufficient documentation

## 2012-12-05 LAB — CBC WITH DIFFERENTIAL/PLATELET
Basophils Absolute: 0 10*3/uL (ref 0.0–0.1)
HCT: 41.2 % (ref 36.0–46.0)
Lymphocytes Relative: 39 % (ref 12–46)
Lymphs Abs: 3.2 10*3/uL (ref 0.7–4.0)
MCV: 95.2 fL (ref 78.0–100.0)
Neutro Abs: 4.2 10*3/uL (ref 1.7–7.7)
Platelets: 300 10*3/uL (ref 150–400)
RBC: 4.33 MIL/uL (ref 3.87–5.11)
RDW: 12.8 % (ref 11.5–15.5)
WBC: 8.4 10*3/uL (ref 4.0–10.5)

## 2012-12-05 LAB — COMPREHENSIVE METABOLIC PANEL
ALT: 22 U/L (ref 0–35)
AST: 20 U/L (ref 0–37)
Alkaline Phosphatase: 89 U/L (ref 39–117)
CO2: 24 mEq/L (ref 19–32)
Chloride: 103 mEq/L (ref 96–112)
GFR calc Af Amer: >90 mL/min (ref >90)
GFR calc non Af Amer: 85 mL/min — ABNORMAL LOW (ref >90)
Glucose, Bld: 95 mg/dL (ref 70–99)
Sodium: 138 mEq/L (ref 135–145)
Total Bilirubin: 0.3 mg/dL (ref 0.3–1.2)

## 2012-12-05 MED ORDER — HEPARIN SOD (PORK) LOCK FLUSH 100 UNIT/ML IV SOLN
500.0000 [IU] | Freq: Once | INTRAVENOUS | Status: AC
  Administered 2012-12-05: 500 [IU] via INTRAVENOUS
  Filled 2012-12-05: qty 5

## 2012-12-05 MED ORDER — SODIUM CHLORIDE 0.9 % IJ SOLN
10.0000 mL | INTRAMUSCULAR | Status: DC | PRN
  Administered 2012-12-05: 10 mL via INTRAVENOUS

## 2012-12-05 MED ORDER — INFLUENZA VAC SPLIT QUAD 0.5 ML IM SUSP
0.5000 mL | Freq: Once | INTRAMUSCULAR | Status: AC
  Administered 2012-12-05: 0.5 mL via INTRAMUSCULAR
  Filled 2012-12-05: qty 0.5

## 2012-12-05 NOTE — Patient Instructions (Addendum)
.  [facility] Discharge Instructions  RECOMMENDATIONS MADE BY THE CONSULTANT AND ANY TEST RESULTS WILL BE SENT TO YOUR REFERRING PHYSICIAN.  EXAM FINDINGS BY THE PHYSICIAN TODAY AND SIGNS OR SYMPTOMS TO REPORT TO CLINIC OR PRIMARY PHYSICIAN: Exam and findings as discussed by Dr. Zigmund Daniel.  MEDICATIONS PRESCRIBED:  We will consider single agent etoposide  VP16 (Etoposide) - this medication can lower your blood pressure. We will monitor this during chemo. Bone marrow suppression (lowers white blood cells (fight infection), lowers red blood cells (make up your blood), lowers platelets (help blood to clot). Hair loss, loss of appetite   INSTRUCTIONS/FOLLOW-UP: 1 week  Thank you for choosing [facility] to provide your oncology and hematology care.  To afford each patient quality time with our providers, please arrive at least 15 minutes before your scheduled appointment time.  With your help, our goal is to use those 15 minutes to complete the necessary work-up to ensure our physicians have the information they need to help with your evaluation and healthcare recommendations.    Effective January 1st, 2014, we ask that you re-schedule your appointment with our physicians should you arrive 10 or more minutes late for your appointment.  We strive to give you quality time with our providers, and arriving late affects you and other patients whose appointments are after yours.    Again, thank you for choosing [facility].  Our hope is that these requests will decrease the amount of time that you wait before being seen by our physicians.       _____________________________________________________________  Should you have questions after your visit to [facility], please contact our office at [phone] between the hours of 8:30 a.m. and 5:00 p.m.  Voicemails left after 4:30 p.m. will not be returned until the following business day.  For  prescription refill requests, have your pharmacy contact our office with your prescription refill request.

## 2012-12-05 NOTE — Progress Notes (Signed)
Lucky Cowboy presents today for injection per MD orders. Flu shot administered IM in left Upper Arm. Administration without incident. Patient tolerated well.  Lucky Cowboy presented for Portacath access and flush. Proper placement of portacath confirmed by CXR. Portacath located lt chest wall accessed with  H 20 needle. Good blood return present after 2 flushes. Portacath flushed with 20ml NS and 500U/8ml Heparin and needle removed intact. Procedure without incident. Patient tolerated procedure well.

## 2012-12-05 NOTE — Progress Notes (Signed)
[facility] OFFICE PROGRESS NOTE  [name], MD  [address] [place] [address]  DIAGNOSIS: Ovarian cancer, unspecified laterality - Plan: CBC with Differential, Comprehensive metabolic panel, CA 125, Ferritin, CBC with Differential, Comprehensive metabolic panel, CA 125, Ferritin, heparin lock flush 100 unit/mL, sodium chloride 0.9 % injection 10 mL  Chief Complaint  Patient presents with  . Follow-up    CURRENT THERAPY: Status post debulking laparoscopically on 10/30/2012, optimally debulked. Previous he treated with neoadjuvant chemotherapy for 5 cycles including carboplatin and Taxotere x1, carboplatin and Taxol x2, and carboplatin and Abraxane x2  INTERVAL HISTORY: Rita Humphrey 72 y.o. female returns for consideration of additional chemotherapy after undergoing laparoscopic debulking of ovarian cancer preceded by 6 cycles of neoadjuvant chemotherapy using 3 different taxanes along with carboplatin. The patient relates an allergic reaction after at the conclusion of carboplatin infusion. This was noted in the chart prior to Abraxane infusion in July 2014. Patient still has discomfort in the left upper quadrant without abdominal distention, diarrhea, constipation, nausea, vomiting, abdominal distention, cough, wheezing, lower M.D. swelling or redness, skin rash, headache, or seizures.  He has experienced considerable peripheral neuropathy involving both her hands and feet.  MEDICAL HISTORY: Past Medical History  Diagnosis Date  . Hypertension   . Hyperlipemia   . Thyroid disease   . Asthma   . Adnexal mass 05/16/2012  . 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 Cancer     [place]  . Breast cancer     s/p bilateral mastectomy [id number]  . Ovarian cancer 05/17/2012  . 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 Peripheral vascular disease 2013    hx of DVT right leg  . History of kidney stones   . Anemia   .  Platelets decreased april 2014     1 transfusion given before chemo  . Deafness in left ear   . Numbness in feet   . Numbness of fingers of both hands   . Dizziness     occasional    INTERIM HISTORY: has Hypokalemia; Abdominal pain; HTN (hypertension); Hyperlipemia; Low back pain; Neck pain; Ascites; Adnexal mass; Unspecified constipation; Thrombocytosis; Ovarian cancer; Omental metastasis; Nausea with vomiting; and Peripheral neuropathy, secondary to chemotherapy on her problem list.    ALLERGIES:  is allergic to penicillins and morphine and related.  MEDICATIONS: has a current medication list which includes the following prescription(s): amlodipine, lidocaine-prilocaine, oxycodone-acetaminophen, potassium chloride sa, tetrahydroz-dextran-peg-povid, and vitamin b-6, and the following Facility-Administered Medications: heparin lock flush and sodium chloride.  SURGICAL HISTORY:  Past Surgical History  Procedure Laterality Date  . Thyroid lobectomy    . Foot surgery      2 spurs taken out of foot  . Varicose vein surgery    . Bladder tack surgery     . Tonsillectomy    . Mastectomy      bilateral   . Laparotomy Bilateral 06/05/2012    Procedure: EXPLORATORY LAPAROTOMY WITH PELVIC TUMOR BIOPSY;  Surgeon: Rejeana Brock A. Duard Brady, MD;  Location: [facility];  Service: Gynecology;  Laterality: Bilateral;  . Abdominal hysterectomy  1980  . Vascular surgery      veins stripped from both legs  . Portacath placement Left 06/25/2012    Procedure: INSERTION PORT-A-CATH;  Surgeon: Dalia Heading, MD;  Location: [facility];  Service: General;  Laterality: Left;  . Laparoscopy N/A 10/30/2012    Procedure: LAPAROSCOPY DIAGNOSTIC, EXPLORATORY LAPAROTOMY, BILATERAL  SALPINGO-OOPHORECTOMY, TUMOR DEBULKING;  Surgeon: Rejeana Brock A. Duard Brady, MD;  Location: [facility];  Service: Gynecology;  Laterality: N/A;    FAMILY HISTORY: family history includes Cancer in her mother; Heart attack in her brother; Hypertension in her father and  sister.  SOCIAL HISTORY:  reports that she has never smoked. She has never used smokeless tobacco. She reports that she does not drink alcohol or use illicit drugs.  REVIEW OF SYSTEMS:  Other than that discussed above is noncontributory.  PHYSICAL EXAMINATION: ECOG PERFORMANCE STATUS: 1 - Symptomatic but completely ambulatory  Blood pressure 109/77, pulse 80, temperature 98.2 F (36.8 C), temperature source Oral, resp. rate 18, weight 186 lb 9.6 oz (84.641 kg).  GENERAL:alert, no distress and comfortable SKIN: skin color, texture, turgor are normal, no rashes or significant lesions EYES: normal, Conjunctiva are pink and non-injected, sclera clear OROPHARYNX:no exudate, no erythema and lips, buccal mucosa, and tongue normal  NECK: supple, thyroid normal size, non-tender, without nodularity CHEST: Normal AP diameter, status post bilateral mastectomy the LYMPH:  no palpable lymphadenopathy in the cervical, axillary or inguinal LUNGS: clear to auscultation and percussion with normal breathing effort HEART: regular rate & rhythm and no murmurs and no lower extremity edema ABDOMEN: No distention. Surgical scar and laparoscopic scars well-healed. Slight tenderness in the left upper quadrant without rebound tenderness Musculoskeletal:no cyanosis of digits and no clubbing  NEURO: alert & oriented x 3 with fluent speech, no focal motor/sensory deficits   LABORATORY DATA: No visits with results within 30 Day(s) from this visit. Latest known visit with results is:  Admission on 10/30/2012, Discharged on 11/02/2012  Component Date Value Range Status  . Sodium 10/31/2012 132* 135 - 145 mEq/L Final  . Potassium 10/31/2012 4.4  3.5 - 5.1 mEq/L Final  . Chloride 10/31/2012 99  96 - 112 mEq/L Final  . CO2 10/31/2012 24  19 - 32 mEq/L Final  . Glucose, Bld 10/31/2012 154* 70 - 99 mg/dL Final  . BUN 16/12/9602 10  6 - 23 mg/dL Final  . Creatinine, Ser 10/31/2012 0.61  0.50 - 1.10 mg/dL Final  .  Calcium 54/11/8117 9.4  8.4 - 10.5 mg/dL Final  . GFR calc non Af Amer 10/31/2012 88* >90 mL/min Final  . GFR calc Af Amer 10/31/2012 >90  >90 mL/min Final   Comment: (NOTE)                          The eGFR has been calculated using the CKD EPI equation.                          This calculation has not been validated in all clinical situations.                          eGFR's persistently <90 mL/min signify possible Chronic Kidney                          Disease.  . WBC 10/31/2012 14.0* 4.0 - 10.5 K/uL Final  . RBC 10/31/2012 4.33  3.87 - 5.11 MIL/uL Final  . Hemoglobin 10/31/2012 14.0  12.0 - 15.0 g/dL Final  . HCT 14/78/2956 41.5  36.0 - 46.0 % Final  . MCV 10/31/2012 95.8  78.0 - 100.0 fL Final  . MCH 10/31/2012 32.3  26.0 - 34.0 pg Final  . MCHC 10/31/2012 33.7  30.0 -  36.0 g/dL Final  . RDW 16/12/9602 13.5  11.5 - 15.5 % Final  . Platelets 10/31/2012 275  150 - 400 K/uL Final  . Sodium 11/01/2012 137  135 - 145 mEq/L Final  . Potassium 11/01/2012 4.0  3.5 - 5.1 mEq/L Final  . Chloride 11/01/2012 105  96 - 112 mEq/L Final  . CO2 11/01/2012 24  19 - 32 mEq/L Final  . Glucose, Bld 11/01/2012 128* 70 - 99 mg/dL Final  . BUN 54/11/8117 7  6 - 23 mg/dL Final  . Creatinine, Ser 11/01/2012 0.57  0.50 - 1.10 mg/dL Final  . Calcium 14/78/2956 9.6  8.4 - 10.5 mg/dL Final  . GFR calc non Af Amer 11/01/2012 >90  >90 mL/min Final  . GFR calc Af Amer 11/01/2012 >90  >90 mL/min Final   Comment: (NOTE)                          The eGFR has been calculated using the CKD EPI equation.                          This calculation has not been validated in all clinical situations.                          eGFR's persistently <90 mL/min signify possible Chronic Kidney                          Disease.     Urinalysis    Component Value Date/Time   COLORURINE YELLOW 07/03/2012 1158   APPEARANCEUR CLEAR 07/03/2012 1158   LABSPEC 1.020 07/03/2012 1158   PHURINE 5.5 07/03/2012 1158   GLUCOSEU 100*  07/03/2012 1158   HGBUR NEGATIVE 07/03/2012 1158   BILIRUBINUR SMALL* 07/03/2012 1158   KETONESUR NEGATIVE 07/03/2012 1158   PROTEINUR 100* 07/03/2012 1158   UROBILINOGEN 4.0* 07/03/2012 1158   NITRITE POSITIVE* 07/03/2012 1158   LEUKOCYTESUR TRACE* 07/03/2012 1158  PATHOLOGY:  2606) Patient: ELLYCE, LAFEVERS Collected: 10/30/2012 Client: [facility] Accession: [id number] Received: 10/30/2012 [name] DOB: 24-Sep-1940 Age: 12 Gender: F Reported: 11/01/2012 [address] Patient Ph: [phone] MRN #: [id number] [place], [place] [address] Visit #: [id number] Chart #: Phone: [phone] Fax: CC: Antionette Char Melissa D. Cross, NP REPORT OF SURGICAL PATHOLOGY FINAL DIAGNOSIS Diagnosis 1. Adnexa - ovary +/- tube, neoplastic, left - HIGH GRADE CARCINOMA, 2.3 CM, INVOLVING THE ADJACENT FALLOPIAN TUBE. - PLEASE SEE ONCOLOGY TEMPLATE FOR DETAILS. 2. 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 3. 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 Microscopic Comment 1. OVARY Specimen(s): Bilateral ovaries and fallopian tubes Procedure(s): Bilateral salpingo-oophorectomy Lymph node sampling performed: No Primary tumor site: Bilateral ovaries Ovarian surface involvement: Yes Maximum tumor size (cm): Left ovary: 2.1 cm; Right ovary: 2.5 cm Histologic type: High grade carcinoma with predominant clear cell carcinoma component and minor serous carcinoma component. Grade: High grade Peritoneal implants: Positive, 18 cm Pelvic extension: Involving left fallopian tube Peritoneal washings: No Lymph nodes: number examined N/A ; number positive N/A TNM code: y pT3C, y pNx FIGO Stage (based on pathologic findings, needs clinical correlation): IIIC Comments: Immunostains were performed on the tumor block. The  tumor cells are strongly  positive for WT-1, p53, p16 and negative for CD10 and vimentin with appropriate controls. Abigail Miyamoto MD Pathologist, Electronic Signature (Case signed 11/01/2012) 1 of 2 FINAL for BRAE, SCHAAFSMA ([id number]) Specimen Gross and Clinical Information Specimen(s) Obtained: 1. Adnexa - ovary +/- tube, neoplastic, left 2. Adnexa - ovary +/- tube, neoplastic, right 3. Omentum, resection for tumor Specimen Clinical Information 1. Ovarian cancer (je) Gross 1. Received in formalin is a 3 x 3 x 2.1 cm ovary, clinically left, with separate fimbriated segment of fallopian tube, 4.3 cm in length and up to 0.6 cm in diameter. The ovary has a tan pink to hyperemic exterior with few scattered adhesions, and has a tan to hyperemic diffusely vaguely nodular solid cut surface. The fimbriated end of the tube has adherent tan white to red indurated, nodular tissue involving a 2.1 x 1.6 cm area. The remaining cut surfaces are unremarkable. Representative sections are submitted as follows: A, B = sections of ovary C, D = sections from fimbriated end of tube E = cross sections of proximal tube Total = five blocks. 2. Received in formalin is a 2.5 x 2.3 x 2 cm ovary, clinically right, with an attached 3.2 cm in length and up to 0.6 cm in diameter fimbriated segment of fallopian tube. The ovary has a tan white to red diffusely roughened external surface, and has tan pink to hyperemic diffusely vaguely nodular solid cut surfaces, with few smooth bloody fluid filled cysts up to 0.3 cm in diameter. The tube has hyperemic, focally granular serosa and unremarkable cut surfaces. Representative sections are submitted as follows: A-C = sections of ovary D = distal end of tube E = cross sections of proximal tube Total = five blocks. 3. Received in formalin is an 18.4 x 7.2 x 3.8 cm previously sectioned soft to indurated fatty tissue diffusely involved with external adhesions. The cut  surfaces vary from tan yellow, soft, lobulated to areas with gray white vaguely nodular indurated tissue infiltrating through fatty tissue. A discrete mass lesion is not identified. Representative sections are submitted in three blocks. (SSW:kh 10-30-12) Stain(s) used in Diagnosis: The following stain(s) were used in diagnosing the case: P16, Vimentin, P53, Wilms' Tumor -1, ER - NOACIS, CD-10. The control(s) stained appropriately. Disclaimer Some of these immunohistochemical stains may have been developed and the performance characteristics determined by [facility]. Some may not have been cleared or approved by the U.S. Food and Drug Administration. The FDA has determined that such clearance or approval is not necessary. This test is used for clinical purposes. It should not be regarded as investigational or for research. This laboratory is certified under the Clinical Laboratory Improvement Amendments of 1988 (CLIA-88) as qualified to perform high complexity clinical laboratory testing. Report signed out from the following location(s) Technical Component and Interpretation performed at [facility] [address], [place], [place] [address].  PATHOLOGY: 2606) Patient: CAMMIE, FAULSTICH Collected: 10/30/2012 Client: [facility] Accession: [id number] Received: 10/30/2012 [name] DOB: 05-25-1940 Age: 34 Gender: F Reported: 11/01/2012 [address] Patient Ph: [phone] MRN #: [id number] [place], [place] [address] Visit #: [id number] Chart #: Phone: [phone] Fax: CC: Antionette Char Melissa D. Cross, NP REPORT OF SURGICAL PATHOLOGY FINAL DIAGNOSIS Diagnosis 1. Adnexa - ovary +/- tube, neoplastic, left - HIGH GRADE CARCINOMA, 2.3 CM, INVOLVING THE ADJACENT FALLOPIAN TUBE. - PLEASE SEE ONCOLOGY TEMPLATE FOR DETAILS. 2. Adnexa - ovary +/- tube, neoplastic, right - RESIDUAL HIGH GRADE CARCINOMA IN A BACKGROUND OF  ENDOSALPINGIOSIS, 2.5 CM, INVOLVING  THE OVARIAN CAPSULE - BENIGN FALLOPIAN TUBAL TISSUE WITH PARATUBAL CYSTS. 3. 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 Microscopic Comment 1. OVARY Specimen(s): Bilateral ovaries and fallopian tubes Procedure(s): Bilateral salpingo-oophorectomy Lymph node sampling performed: No Primary tumor site: Bilateral ovaries Ovarian surface involvement: Yes Maximum tumor size (cm): Left ovary: 2.1 cm; Right ovary: 2.5 cm Histologic type: High grade carcinoma with predominant clear cell carcinoma component and minor serous carcinoma component. Grade: High grade Peritoneal implants: Positive, 18 cm Pelvic extension: Involving left fallopian tube Peritoneal washings: No Lymph nodes: number examined N/A ; number positive N/A TNM code: y pT3C, y pNx FIGO Stage (based on pathologic findings, needs clinical correlation): IIIC Comments: Immunostains were performed on the tumor block. The tumor cells are strongly positive for WT-1, p53, p16 and negative for CD10 and vimentin with appropriate controls. Abigail Miyamoto MD Pathologist, Electronic Signature (Case signed 11/01/2012) 1 of 2 FINAL for ALYZZA, ANDRINGA ([id number]) Specimen Gross and Clinical Information Specimen(s) Obtained: 1. Adnexa - ovary +/- tube, neoplastic, left 2. Adnexa - ovary +/- tube, neoplastic, right 3. Omentum, resection for tumor Specimen Clinical Information 1. Ovarian cancer (je) Gross 1. Received in formalin is a 3 x 3 x 2.1 cm ovary, clinically left, with separate fimbriated segment of fallopian tube, 4.3 cm in length and up to 0.6 cm in diameter. The ovary has a tan pink to hyperemic exterior with few scattered adhesions, and has a tan to hyperemic diffusely vaguely nodular solid cut surface. The fimbriated end of the tube has adherent tan white to red indurated, nodular tissue involving a 2.1 x 1.6 cm area. The remaining cut surfaces are unremarkable. Representative sections are  submitted as follows: A, B = sections of ovary C, D = sections from fimbriated end of tube E = cross sections of proximal tube Total = five blocks. 2. Received in formalin is a 2.5 x 2.3 x 2 cm ovary, clinically right, with an attached 3.2 cm in length and up to 0.6 cm in diameter fimbriated segment of fallopian tube. The ovary has a tan white to red diffusely roughened external surface, and has tan pink to hyperemic diffusely vaguely nodular solid cut surfaces, with few smooth bloody fluid filled cysts up to 0.3 cm in diameter. The tube has hyperemic, focally granular serosa and unremarkable cut surfaces. Representative sections are submitted as follows: A-C = sections of ovary D = distal end of tube E = cross sections of proximal tube Total = five blocks. 3. Received in formalin is an 18.4 x 7.2 x 3.8 cm previously sectioned soft to indurated fatty tissue diffusely involved with external adhesions. The cut surfaces vary from tan yellow, soft, lobulated to areas with gray white vaguely nodular indurated tissue infiltrating through fatty tissue. A discrete mass lesion is not identified. Representative sections are submitted in three blocks. (SSW:kh 10-30-12) Stain(s) used in Diagnosis: The following stain(s) were used in diagnosing the case: P16, Vimentin, P53, Wilms' Tumor -1, ER - NOACIS, CD-10. The control(s) stained appropriately. Disclaimer Some of these immunohistochemical stains may have been developed and the performance characteristics determined by [facility]. Some may not have been cleared or approved by the U.S. Food and Drug Administration. The FDA has determined that such clearance or approval is not necessary. This test is used for clinical purposes. It should not be regarded as investigational or for research. This laboratory is certified under the Clinical Laboratory Improvement Amendments of 1988 (  CLIA-88) as qualified to perform high complexity clinical  laboratory testing. Report signed out from the following location(s) Technical Component and Interpretation performed at [facility] [address], [place], [place] [address].  RADIOLOGY: Clinical Data: Ovarian cancer  CT ABDOMEN AND PELVIS WITH CONTRAST  Technique: Multidetector CT imaging of the abdomen and pelvis was  performed following the standard protocol during bolus  administration of intravenous contrast.  Contrast: OMNIPAQUE IOHEXOL 300 MG/ML SOLN  Comparison: 08/30/2012  Findings: There is no pleural or pericardial effusion.  No focal liver abnormality identified. The gallbladder appears  within normal limits. No biliary dilatation. Normal appearance of  the pancreas. The spleen appears normal.  The adrenal glands are both unremarkable. Bilateral renal sinus  cysts are identified. No evidence for obstructive uropathy. 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  The urinary bladder appears normal. Previous hysterectomy. Left  adnexal soft tissue structure measures 2.2 x 4.0 cm, image  76/series 2. The right adnexal soft tissue structure measures 2.3  x 3.1 cm, image 76/series 2. Previously 2.6 x 3.5 cm.  Loculated fluid within the inferior left pelvis measures 3.5 x 2.5  cm, image 79/series 2. Previously 3.7 x 2.7 cm. Omental tumor is  identified measuring 7.5 x 3.0 cm, image 43/series 2. Previously  7.9 x 3.3 cm.  The abdominal aorta has a normal caliber. No aneurysm. No upper  abdominal adenopathy identified. There is no enlarged pelvic or  inguinal adenopathy identified.  The stomach is normal. The small bowel loops are within normal  limits. No evidence for small bowel obstruction. Normal caliber  of the colon multiple distal colonic diverticula identified without  acute inflammation. Review of the visualized bony structures is  significant for mild lumbar spondylosis. No aggressive lytic or   sclerotic bone lesions.  IMPRESSION:  1. No acute findings.  2. Lateral mild improvement and peritoneal disease. No new or  progressive disease identified.  3. No evidence for obstructive uropathy or bowel obstruction.  Original Report Authenticated By: Signa Kell, M.D.  ASSESSMENT: 1. Ovarian cancer, stage III, status post neoadjuvant chemotherapy followed by debulking laparoscopic surgery with optimal results, for additional treatment.  #2.ChemoFX analysis on tumor cells revealed sensitivity to platinum agents and nonresponsive to gemcitabine. #3. Allergic reaction to carboplatin during last cycle of treatment characterized by shortness of breath, wheezing, and diffuse erythema of the skin. #4. According to ChemoFX responsive he is predicted by docetaxel, paclitaxel, etoposide, and platinum since the patient had significant neuropathy, we'll go with the etoposide for 3 days every 21-28 days.   PLAN: #1. Additional chemotherapy utilizing etoposide for 3 days every 3-4 weeks. Treatment to start in one week. #2. Both the patient and her daughter are in agreement with this strategy.   All questions were answered. The patient knows to call the clinic with any problems, questions or concerns. We can certainly see the patient much sooner if necessary.   I spent 40 minutes counseling the patient face to face. The total time spent in the appointment was 30 minutes.    Maurilio Lovely, MD 12/05/2012 1:27 PM

## 2012-12-07 DIAGNOSIS — Z853 Personal history of malignant neoplasm of breast: Secondary | ICD-10-CM

## 2012-12-07 HISTORY — DX: Personal history of malignant neoplasm of breast: Z85.3

## 2012-12-10 ENCOUNTER — Other Ambulatory Visit (HOSPITAL_COMMUNITY): Payer: Self-pay | Admitting: Hematology and Oncology

## 2012-12-10 MED ORDER — PROCHLORPERAZINE MALEATE 10 MG PO TABS: 10.0000 mg | ORAL_TABLET | Freq: Four times a day (QID) | ORAL | Status: DC | PRN

## 2012-12-10 MED ORDER — DEXAMETHASONE 4 MG PO TABS: ORAL_TABLET | ORAL | Status: DC

## 2012-12-10 NOTE — Patient Instructions (Addendum)
[facility]   CHEMOTHERAPY INSTRUCTIONS  VP16 (Etoposide) - this medication can lower your blood pressure. We will monitor this during chemo. Bone marrow suppression (lowers white blood cells (fight infection), lowers red blood cells (make up your blood), lowers platelets (help blood to clot). Hair loss, loss of appetite.   POTENTIAL SIDE EFFECTS OF TREATMENT: Increased Susceptibility to Infection, Vomiting, Constipation, Hair Thinning, Changes in Character of Skin and Nails (brittleness, dryness,etc.), Bone Marrow Suppression, Complete Hair Loss, Nausea, Diarrhea and Mouth Sores  EDUCATIONAL MATERIALS GIVEN AND REVIEWED: Specific Instructions Sheets: Etoposide   SELF CARE ACTIVITIES WHILE ON CHEMOTHERAPY: Increase your fluid intake 48 hours prior to treatment and drink at least 2 quarts per day after treatment., No alcohol intake., No aspirin or other medications unless approved by your oncologist., Eat foods that are light and easy to digest., Eat foods at cold or room temperature., No fried, fatty, or spicy foods immediately before or after treatment., Have teeth cleaned professionally before starting treatment. Keep dentures and partial plates clean., Use soft toothbrush and do not use mouthwashes that contain alcohol. Biotene is a good mouthwash that is available at most pharmacies or may be ordered by calling [phone]., Use warm salt water gargles (1 teaspoon salt per 1 quart warm water) before and after meals and at bedtime. Or you may rinse with 2 tablespoons of three -percent hydrogen peroxide mixed in eight ounces of water., Always use sunscreen with SPF (Sun Protection Factor) of 30 or higher., Use your nausea medication as directed to prevent nausea., Use your stool softener or laxative as directed to prevent constipation. and Use your anti-diarrheal medication as directed to stop diarrhea.  Please wash your hands for at least 30 seconds using  warm soapy water. Handwashing is the #1 way to prevent the spread of germs. Stay away from sick people or people who are getting over a cold. If you develop respiratory systems such as green/yellow mucus production or productive cough or persistent cough let us know and we will see if you need an antibiotic. It is a good idea to keep a pair of gloves on when going into grocery stores/[employer] to decrease your risk of coming into contact with germs on the carts, etc. Carry alcohol hand gel with you at all times and use it frequently if out in public. All foods need to be cooked thoroughly. No raw foods. No medium or undercooked meats, eggs. If your food is cooked medium well, it does not need to be hot pink or saturated with bloody liquid at all. Vegetables and fruits need to be washed/rinsed under the faucet with a dish detergent before being consumed. You can eat raw fruits and vegetables unless we tell you otherwise but it would be best if you cooked them or bought frozen. Do not eat off of salad bars or hot bars unless you really trust the cleanliness of the restaurant. If you need dental work, please let us know before you go for your appointment so that we can coordinate the best possible time for you in regards to your chemo regimen. You need to also let your dentist know that you are actively taking chemo. We may need to do labs prior to your dental appointment. We also want your bowels moving at least every other day. If this is not happening, we need to know so that we can get you on a bowel regimen to help you go.  MEDICATIONS: You have been given prescriptions for the following medications:  Dexamethasone 4mg  tablet. Starting the day after chemo, take 2 tablets in the am and 2 tablets in the pm for 2 days. Then Stop. Take with food.   Compazine 10mg  tablet. Take 1 tablet every 6 hours as needed for nausea/vomiting.    Over-the-Counter Meds:  Colace - this is a stool softener. Take 100mg   capsule 2-6 times a day as needed. If you have to take more than 6 capsules of Colace a day call the Cancer Center.  Senna - this is a mild laxative used to treat mild constipation. May take up to 3 tablets at bedtime.   Milk of Magnesia - this is a laxative used to treat moderate to severe constipation. May take 2-4 tablespoons every 8 hours as needed. May increase to 8 tablespoons x 1 dose and if no bowel movement call the Cancer Center.  Imodium - this is for diarrhea. Take 2 tabs after 1st loose stool and then 1 tab after each loose stool until you go a total of 12 hours without a loose stool. Call Cancer Center if loose stools continue.   SYMPTOMS TO REPORT AS SOON AS POSSIBLE AFTER TREATMENT:  FEVER GREATER THAN 100.5 F  CHILLS WITH OR WITHOUT FEVER  NAUSEA AND VOMITING THAT IS NOT CONTROLLED WITH YOUR NAUSEA MEDICATION  UNUSUAL SHORTNESS OF BREATH  UNUSUAL BRUISING OR BLEEDING  TENDERNESS IN MOUTH AND THROAT WITH OR WITHOUT PRESENCE OF ULCERS  URINARY PROBLEMS  BOWEL PROBLEMS  UNUSUAL RASH    Wear comfortable clothing and clothing appropriate for easy access to any Portacath or PICC line. Let us know if there is anything that we can do to make your therapy better!      I have been informed and understand all of the instructions given to me and have received a copy. I have been instructed to call the clinic [phone] or my family physician as soon as possible for continued medical care, if indicated. I do not have any more questions at this time but understand that I may call the Cancer Center or the Patient Navigator at [phone] during office hours should I have questions or need assistance in obtaining follow-up care.      _________________________________________      _______________     __________ Signature of Patient or Authorized Representative        Date                            Time      _________________________________________ Nurse's  Signature      Ovarian Cancer The ovaries are the parts of the female reproductive system that produce eggs. Women have two ovaries. They are located on either side of the uterus. Ovarian cancer is an abnormal growth of tissue (tumor) in one or both ovaries that is cancerous (malignant). Unlike noncancerous (benign) tumors, malignant tumors can spread to other parts of your body.  CAUSES  The exact cause of ovarian cancer is unknown. RISK FACTORS There are a number of risk factors that can increase your chances of getting ovarian cancer. They include:  Age. Ovarian cancer is most common in women aged 22 75 years.  Being Caucasian.  Personal or family history of endometrial, colon, breast, or ovarian cancer.  Having the BRCA 1 and BRCA 2 genes.  Using fertility medicines.  Starting menstruation before the age of 12 years.  Starting menopause after the age of 102 years.  Becoming pregnant for the first time at the age of 9 years or older.  Never being pregnant.  Having hormone replacement therapy.  Eating high amounts of animal fat. SYMPTOMS  Early ovarian cancer often does not cause symptoms. As the cancer grows, symptoms may include:  Unexplained weight loss.  Abdominal pain, swelling, or bloating.  Pain and pressure in the back and pelvis.  Abnormal vaginal bleeding.  Loss of appetite.  Frequent urination.  Pain during intercourse.  Fatigue. DIAGNOSIS  Your caregiver will ask about your medical history. He or she may also perform a number of procedures, such as:  A pelvic exam. Your caregiver will feel the organs in the pelvis for any lumps or changes in their shape or size.  Imaging tests, such as computed tomography (CT) scans, ultrasound tests, or magnetic resonance imaging (MRI).  Blood tests. Your cancer will be staged to determine its severity and extent. Staging is a careful attempt to find out the size of the tumor, whether the cancer has spread,  and if so, to what parts of the body. You may need to have more tests to determine the stage of your cancer. The test results will help determine what treatment plan is best for you. STAGES   Stage I. The cancer is found in only one or both ovaries.  Stage II. The cancer has spread to other parts of the pelvis, such as the uterus or fallopian tubes.  Stage III. The cancer has spread outside the pelvis to the abdominal cavity or to the lymph nodes in the abdomen.  Stage IV. The cancer has spread outside the abdomen to areas such as the liver or lungs. TREATMENT  Most women with ovarian cancer are treated with a combination of surgery and chemotherapy.   If the cancer is found at an early stage, surgery may be done to remove one ovary and its fallopian tube.  For more advanced cases, surgery may be done to remove the uterus, cervix, fallopian tubes, and ovaries. Your caregiver may also remove the lymph nodes near the tumor and some tissue in the abdomen.  Chemotherapy is the use of drugs to kill cancer cells. Chemotherapy may be used before or after the surgery. HOME CARE INSTRUCTIONS   Only take over-the-counter or prescription medicines as directed by your caregiver.  Maintain a healthy diet.  Consider joining a support group. If you are feeling stressed because you have ovarian cancer, a support group may help you learn to cope with the disease.  Seek advice to help you manage the treatment of side effects.  Keep all follow-up appointments as directed by your caregiver. SEEK IMMEDIATE MEDICAL CARE IF:   You have new or worsening symptoms.  You have a fever during your chemotherapy treatment. Document Released: 01/12/2004 Document Revised: 02/08/2012 Document Reviewed: 11/10/2011 [facility] Patient Information 2014 [place], [place]. Etoposide, VP-16 injection What is this medicine? ETOPOSIDE, VP-16 (e toe POE side) is a chemotherapy drug. It is used to treat testicular cancer, lung  cancer, and other cancers. This medicine may be used for other purposes; ask your health care provider or pharmacist if you have questions. What should I tell my health care provider before I take this medicine? They need to know if you have any of these conditions: -infection -kidney disease -low blood counts, like low white cell, platelet, or red cell counts -an unusual or allergic reaction to etoposide, other chemotherapeutic agents, other medicines, foods,  dyes, or preservatives -pregnant or trying to get pregnant -breast-feeding How should I use this medicine? This medicine is for infusion into a vein. It is administered in a hospital or clinic by a specially trained health care professional. Talk to your pediatrician regarding the use of this medicine in children. Special care may be needed. Overdosage: If you think you have taken too much of this medicine contact a poison control center or emergency room at once. NOTE: This medicine is only for you. Do not share this medicine with others. What if I miss a dose? It is important not to miss your dose. Call your doctor or health care professional if you are unable to keep an appointment. What may interact with this medicine? -cyclosporine -medicines to increase blood counts like filgrastim, pegfilgrastim, sargramostim -vaccines This list may not describe all possible interactions. Give your health care provider a list of all the medicines, herbs, non-prescription drugs, or dietary supplements you use. Also tell them if you smoke, drink alcohol, or use illegal drugs. Some items may interact with your medicine. What should I watch for while using this medicine? Visit your doctor for checks on your progress. This drug may make you feel generally unwell. This is not uncommon, as chemotherapy can affect healthy cells as well as cancer cells. Report any side effects. Continue your course of treatment even though you feel ill unless your doctor  tells you to stop. In some cases, you may be given additional medicines to help with side effects. Follow all directions for their use. Call your doctor or health care professional for advice if you get a fever, chills or sore throat, or other symptoms of a cold or flu. Do not treat yourself. This drug decreases your body's ability to fight infections. Try to avoid being around people who are sick. This medicine may increase your risk to bruise or bleed. Call your doctor or health care professional if you notice any unusual bleeding. Be careful brushing and flossing your teeth or using a toothpick because you may get an infection or bleed more easily. If you have any dental work done, tell your dentist you are receiving this medicine. Avoid taking products that contain aspirin, acetaminophen, ibuprofen, naproxen, or ketoprofen unless instructed by your doctor. These medicines may hide a fever. Do not become pregnant while taking this medicine. Women should inform their doctor if they wish to become pregnant or think they might be pregnant. There is a potential for serious side effects to an unborn child. Talk to your health care professional or pharmacist for more information. Do not breast-feed an infant while taking this medicine. What side effects may I notice from receiving this medicine? Side effects that you should report to your doctor or health care professional as soon as possible: -allergic reactions like skin rash, itching or hives, swelling of the face, lips, or tongue -low blood counts - this medicine may decrease the number of white blood cells, red blood cells and platelets. You may be at increased risk for infections and bleeding. -signs of infection - fever or chills, cough, sore throat, pain or difficulty passing urine -signs of decreased platelets or bleeding - bruising, pinpoint red spots on the skin, black, tarry stools, blood in the urine -signs of decreased red blood cells -  unusually weak or tired, fainting spells, lightheadedness -breathing problems -changes in vision -mouth or throat sores or ulcers -pain, redness, swelling or irritation at the injection site -pain, tingling, numbness in the hands  or feet -redness, blistering, peeling or loosening of the skin, including inside the mouth -seizures -vomiting Side effects that usually do not require medical attention (report to your doctor or health care professional if they continue or are bothersome): -diarrhea -hair loss -loss of appetite -nausea -stomach pain This list may not describe all possible side effects. Call your doctor for medical advice about side effects. You may report side effects to FDA at 1-800-FDA-1088. Where should I keep my medicine? This drug is given in a hospital or clinic and will not be stored at home. NOTE: This sheet is a summary. It may not cover all possible information. If you have questions about this medicine, talk to your doctor, pharmacist, or health care provider.  2013, [employer]. (06/25/2007 5:24:12 PM)

## 2012-12-13 ENCOUNTER — Encounter (HOSPITAL_COMMUNITY): Payer: Medicare Other

## 2012-12-13 ENCOUNTER — Encounter (HOSPITAL_COMMUNITY): Payer: Self-pay

## 2012-12-13 ENCOUNTER — Encounter (HOSPITAL_BASED_OUTPATIENT_CLINIC_OR_DEPARTMENT_OTHER): Payer: Medicare Other

## 2012-12-13 DIAGNOSIS — R209 Unspecified disturbances of skin sensation: Secondary | ICD-10-CM

## 2012-12-13 DIAGNOSIS — C786 Secondary malignant neoplasm of retroperitoneum and peritoneum: Secondary | ICD-10-CM

## 2012-12-13 DIAGNOSIS — C569 Malignant neoplasm of unspecified ovary: Secondary | ICD-10-CM

## 2012-12-13 DIAGNOSIS — R1032 Left lower quadrant pain: Secondary | ICD-10-CM

## 2012-12-13 MED ORDER — LIDOCAINE-PRILOCAINE 2.5-2.5 % EX CREA: TOPICAL_CREAM | CUTANEOUS | Status: DC

## 2012-12-13 NOTE — Progress Notes (Signed)
Chemo teaching done and consent signed for Etoposide. Med calendar given.

## 2012-12-13 NOTE — Patient Instructions (Signed)
[facility] Discharge Instructions  RECOMMENDATIONS MADE BY THE CONSULTANT AND ANY TEST RESULTS WILL BE SENT TO YOUR REFERRING PHYSICIAN.  EXAM FINDINGS BY THE PHYSICIAN TODAY AND SIGNS OR SYMPTOMS TO REPORT TO CLINIC OR PRIMARY PHYSICIAN: Exam and findings as discussed by Dr. Zigmund Daniel. Plan to start you on a different chemotherapy.  Will be using etoposide for 3 days every 3 - 4 weeks.  MEDICATIONS PRESCRIBED:  none  INSTRUCTIONS/FOLLOW-UP: Follow-up on day 2 of chemotherapy.  Thank you for choosing [facility] to provide your oncology and hematology care.  To afford each patient quality time with our providers, please arrive at least 15 minutes before your scheduled appointment time.  With your help, our goal is to use those 15 minutes to complete the necessary work-up to ensure our physicians have the information they need to help with your evaluation and healthcare recommendations.    Effective January 1st, 2014, we ask that you re-schedule your appointment with our physicians should you arrive 10 or more minutes late for your appointment.  We strive to give you quality time with our providers, and arriving late affects you and other patients whose appointments are after yours.    Again, thank you for choosing [facility].  Our hope is that these requests will decrease the amount of time that you wait before being seen by our physicians.       _____________________________________________________________  Should you have questions after your visit to [facility], please contact our office at [phone] between the hours of 8:30 a.m. and 5:00 p.m.  Voicemails left after 4:30 p.m. will not be returned until the following business day.  For prescription refill requests, have your pharmacy contact our office with your prescription refill request.

## 2012-12-13 NOTE — Progress Notes (Signed)
[facility] OFFICE PROGRESS NOTE  Rita Pizza, MD  [address] [place] [address]  DIAGNOSIS: Ovarian cancer, unspecified laterality  Omental metastasis  Chief Complaint  Patient presents with  . Ovarian Cancer    Discuss new chemo    CURRENT THERAPY: Plan to give etoposide for 3 days every 3-4 weeks  INTERVAL HISTORY:j Rita Humphrey 72 y.o. female returns for discussion of treatment for persistent ovarian cancer, status post neoadjuvant chemotherapy followed by definitive debulking with disease left behind. She still has occasional left lower quadrant abdominal pain with diffuse bone aches as well as numbness and tingling involving the lower extremities greater than the upper is. She denies any fever, night sweats, diarrhea, constipation, dysuria, hesitancy, hematuria, lower extremity swelling or redness, cough, wheezing, headache, skin rash, or seizures.   MEDICAL HISTORY: Past Medical History  Diagnosis Date  . Hypertension   . Hyperlipemia   . Thyroid disease   . Asthma   . Adnexal mass 05/16/2012  . 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 Cancer     [place]  . Breast cancer     s/p bilateral mastectomy [id number]  . Ovarian cancer 05/17/2012  . 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 Peripheral vascular disease 2013    hx of DVT right leg  . History of kidney stones   . Anemia   . Platelets decreased april 2014     1 transfusion given before chemo  . Deafness in left ear   . Numbness in feet   . Numbness of fingers of both hands   . Dizziness     occasional    INTERIM HISTORY: has Hypokalemia; Abdominal pain; HTN (hypertension); Hyperlipemia; Low back pain; Neck pain; Ascites; Adnexal mass; Unspecified constipation; Thrombocytosis; Ovarian cancer; Omental metastasis; Nausea with vomiting; Peripheral neuropathy, secondary to chemotherapy; Ovarian cancer; Cancer; and Breast cancer on her problem  list.    ALLERGIES:  is allergic to gabapentin; penicillins; carboplatin; and morphine and related.  MEDICATIONS: has a current medication list which includes the following prescription(s): amlodipine, vitamin d3, lidocaine-prilocaine, potassium chloride sa, tetrahydroz-dextran-peg-povid, vitamin b-6, dexamethasone, etoposide, ondansetron, oxycodone-acetaminophen, pegfilgrastim, and prochlorperazine.  SURGICAL HISTORY:  Past Surgical History  Procedure Laterality Date  . Thyroid lobectomy    . Foot surgery      2 spurs taken out of foot  . Varicose vein surgery    . Bladder tack surgery     . Tonsillectomy    . Mastectomy      bilateral   . Laparotomy Bilateral 06/05/2012    Procedure: EXPLORATORY LAPAROTOMY WITH PELVIC TUMOR BIOPSY;  Surgeon: Rejeana Brock A. Duard Brady, MD;  Location: [facility];  Service: Gynecology;  Laterality: Bilateral;  . Abdominal hysterectomy  1980  . Vascular surgery      veins stripped from both legs  . Portacath placement Left 06/25/2012    Procedure: INSERTION PORT-A-CATH;  Surgeon: Dalia Heading, MD;  Location: [facility];  Service: General;  Laterality: Left;  . Laparoscopy N/A 10/30/2012    Procedure: LAPAROSCOPY DIAGNOSTIC, EXPLORATORY LAPAROTOMY, BILATERAL SALPINGO-OOPHORECTOMY, TUMOR DEBULKING;  Surgeon: Rejeana Brock A. Duard Brady, MD;  Location: [facility];  Service: Gynecology;  Laterality: N/A;    FAMILY HISTORY: family history includes Cancer in her mother; Heart attack in her brother; Hypertension in her father and sister.  SOCIAL HISTORY:  reports that she has never smoked. She has never used smokeless tobacco. She reports that she  does not drink alcohol or use illicit drugs.  REVIEW OF SYSTEMS:  Other than that discussed above is noncontributory.  PHYSICAL EXAMINATION: ECOG PERFORMANCE STATUS: 1 - Symptomatic but completely ambulatory  There were no vitals taken for this visit.  GENERAL:alert, no distress and comfortable SKIN: skin color, texture, turgor are normal, no  rashes or significant lesions EYES: PERLA; Conjunctiva are pink and non-injected, sclera clear OROPHARYNX:no exudate, no erythema on lips, buccal mucosa, or tongue. NECK: supple, thyroid normal size, non-tender, without nodularity. No masses CHEST: Increased AP diameter with a LifePort in place. No breast masses. LYMPH:  no palpable lymphadenopathy in the cervical, axillary or inguinal LUNGS: clear to auscultation and percussion with normal breathing effort HEART: regular rate & rhythm and no murmurs and no lower extremity edema ABDOMEN: Soft with well-healed surgical scar no evidence of wound dehisced since, purulence, or hemorrhage. No rebound tenderness. No CVA tenderness or free fluid wave. MUSCULOSKELETAL:no cyanosis of digits and no clubbing. Range of motion normal.  NEURO: alert & oriented x 3 with fluent speech, no focal motor/sensory deficits. Deep tendon reflexes diminished.   LABORATORY DATA: Office Visit on 12/05/2012  Component Date Value Range Status  . WBC 12/05/2012 8.4  4.0 - 10.5 K/uL Final  . RBC 12/05/2012 4.33  3.87 - 5.11 MIL/uL Final  . Hemoglobin 12/05/2012 13.8  12.0 - 15.0 g/dL Final  . HCT 14/78/2956 41.2  36.0 - 46.0 % Final  . MCV 12/05/2012 95.2  78.0 - 100.0 fL Final  . MCH 12/05/2012 31.9  26.0 - 34.0 pg Final  . MCHC 12/05/2012 33.5  30.0 - 36.0 g/dL Final  . RDW 21/30/8657 12.8  11.5 - 15.5 % Final  . Platelets 12/05/2012 300  150 - 400 K/uL Final  . Neutrophils Relative % 12/05/2012 50  43 - 77 % Final  . Neutro Abs 12/05/2012 4.2  1.7 - 7.7 K/uL Final  . Lymphocytes Relative 12/05/2012 39  12 - 46 % Final  . Lymphs Abs 12/05/2012 3.2  0.7 - 4.0 K/uL Final  . Monocytes Relative 12/05/2012 7  3 - 12 % Final  . Monocytes Absolute 12/05/2012 0.6  0.1 - 1.0 K/uL Final  . Eosinophils Relative 12/05/2012 4  0 - 5 % Final  . Eosinophils Absolute 12/05/2012 0.3  0.0 - 0.7 K/uL Final  . Basophils Relative 12/05/2012 0  0 - 1 % Final  . Basophils Absolute  12/05/2012 0.0  0.0 - 0.1 K/uL Final  . Sodium 12/05/2012 138  135 - 145 mEq/L Final  . Potassium 12/05/2012 4.0  3.5 - 5.1 mEq/L Final  . Chloride 12/05/2012 103  96 - 112 mEq/L Final  . CO2 12/05/2012 24  19 - 32 mEq/L Final  . Glucose, Bld 12/05/2012 95  70 - 99 mg/dL Final  . BUN 84/69/6295 16  6 - 23 mg/dL Final  . Creatinine, Ser 12/05/2012 0.68  0.50 - 1.10 mg/dL Final  . Calcium 28/41/3244 10.0  8.4 - 10.5 mg/dL Final  . Total Protein 12/05/2012 7.5  6.0 - 8.3 g/dL Final  . Albumin 03/09/7251 3.9  3.5 - 5.2 g/dL Final  . AST 66/44/0347 20  0 - 37 U/L Final  . ALT 12/05/2012 22  0 - 35 U/L Final  . Alkaline Phosphatase 12/05/2012 89  39 - 117 U/L Final  . Total Bilirubin 12/05/2012 0.3  0.3 - 1.2 mg/dL Final  . GFR calc non Af Amer 12/05/2012 85* >90 mL/min Final  . GFR calc Af  Amer 12/05/2012 >90  >90 mL/min Final   Comment: (NOTE)                          The eGFR has been calculated using the CKD EPI equation.                          This calculation has not been validated in all clinical situations.                          eGFR's persistently <90 mL/min signify possible Chronic Kidney                          Disease.  . CA 125 12/05/2012 15.2  0.0 - 30.2 U/mL Final   Performed at [facility]  . Ferritin 12/05/2012 100  10 - 291 ng/mL Final   Performed at [facility]    PATHOLOGY:  Urinalysis    Component Value Date/Time   COLORURINE YELLOW 07/03/2012 1158   APPEARANCEUR CLEAR 07/03/2012 1158   LABSPEC 1.020 07/03/2012 1158   PHURINE 5.5 07/03/2012 1158   GLUCOSEU 100* 07/03/2012 1158   HGBUR NEGATIVE 07/03/2012 1158   BILIRUBINUR SMALL* 07/03/2012 1158   KETONESUR NEGATIVE 07/03/2012 1158   PROTEINUR 100* 07/03/2012 1158   UROBILINOGEN 4.0* 07/03/2012 1158   NITRITE POSITIVE* 07/03/2012 1158   LEUKOCYTESUR TRACE* 07/03/2012 1158    RADIOGRAPHIC STUDIES: No results found.  ASSESSMENT: *1. Ovarian cancer, stage III, status post neoadjuvant  chemotherapy followed by debulking laparoscopic surgery with optimal results, for additional treatment.  #2.ChemoFX analysis on tumor cells revealed sensitivity to platinum agents and nonresponsive to gemcitabine.  #3. Allergic reaction to carboplatin during last cycle of treatment characterized by shortness of breath, wheezing, and diffuse erythema of the skin.  #4. According to ChemoFX responsiveness is predicted by docetaxel, paclitaxel, etoposide, and platinum. Since the patient had significant neuropathy coupled with allergic reaction to carboplatin, etoposide for 3 days every 21-28 days will be utilized beginning on 12/17/2012.    PLAN: #1. Chemotherapy teaching today. #2. Etoposide intravenously daily for 3 days with Neulasta support on day 4 to begin on 12/17/2012 with office visit on 12/18/2012. #3. The patient and her daughters are in agreement with this strategy.   All questions were answered. The patient knows to call the clinic with any problems, questions or concerns. We can certainly see the patient much sooner if necessary.   I spent 30 minutes counseling the patient face to face. The total time spent in the appointment was 25 minutes.    Maurilio Lovely, MD 12/13/2012 2:35 PM

## 2012-12-17 ENCOUNTER — Encounter (HOSPITAL_BASED_OUTPATIENT_CLINIC_OR_DEPARTMENT_OTHER): Payer: Medicare Other

## 2012-12-17 VITALS — BP 134/84 | HR 91 | Temp 97.2°F | Resp 18 | Wt 189.4 lb

## 2012-12-17 DIAGNOSIS — N9489 Other specified conditions associated with female genital organs and menstrual cycle: Secondary | ICD-10-CM

## 2012-12-17 DIAGNOSIS — C786 Secondary malignant neoplasm of retroperitoneum and peritoneum: Secondary | ICD-10-CM

## 2012-12-17 DIAGNOSIS — R109 Unspecified abdominal pain: Secondary | ICD-10-CM

## 2012-12-17 DIAGNOSIS — C801 Malignant (primary) neoplasm, unspecified: Secondary | ICD-10-CM

## 2012-12-17 DIAGNOSIS — I10 Essential (primary) hypertension: Secondary | ICD-10-CM

## 2012-12-17 DIAGNOSIS — C569 Malignant neoplasm of unspecified ovary: Secondary | ICD-10-CM

## 2012-12-17 DIAGNOSIS — C50919 Malignant neoplasm of unspecified site of unspecified female breast: Secondary | ICD-10-CM

## 2012-12-17 DIAGNOSIS — IMO0002 Reserved for concepts with insufficient information to code with codable children: Secondary | ICD-10-CM

## 2012-12-17 DIAGNOSIS — Z5111 Encounter for antineoplastic chemotherapy: Secondary | ICD-10-CM

## 2012-12-17 LAB — CBC WITH DIFFERENTIAL/PLATELET
Basophils Absolute: 0 10*3/uL (ref 0.0–0.1)
Eosinophils Absolute: 0.2 10*3/uL (ref 0.0–0.7)
Eosinophils Relative: 2 % (ref 0–5)
Hemoglobin: 13.6 g/dL (ref 12.0–15.0)
Lymphocytes Relative: 26 % (ref 12–46)
Lymphs Abs: 2.8 10*3/uL (ref 0.7–4.0)
MCH: 31.3 pg (ref 26.0–34.0)
MCV: 94.5 fL (ref 78.0–100.0)
Monocytes Relative: 7 % (ref 3–12)
RBC: 4.34 MIL/uL (ref 3.87–5.11)
WBC: 10.8 10*3/uL — ABNORMAL HIGH (ref 4.0–10.5)

## 2012-12-17 MED ORDER — SODIUM CHLORIDE 0.9 % IV SOLN
120.0000 mg/m2 | Freq: Once | INTRAVENOUS | Status: AC
  Administered 2012-12-17: 230 mg via INTRAVENOUS
  Filled 2012-12-17: qty 11.5

## 2012-12-17 MED ORDER — SODIUM CHLORIDE 0.9 % IV SOLN
Freq: Once | INTRAVENOUS | Status: AC
  Administered 2012-12-17: 11:00:00 via INTRAVENOUS

## 2012-12-17 MED ORDER — DEXAMETHASONE SODIUM PHOSPHATE 10 MG/ML IJ SOLN: 10.0000 mg | Freq: Once | INTRAMUSCULAR | Status: DC

## 2012-12-17 MED ORDER — SODIUM CHLORIDE 0.9 % IV SOLN: 8.0000 mg | Freq: Once | INTRAVENOUS | Status: DC

## 2012-12-17 MED ORDER — SODIUM CHLORIDE 0.9 % IJ SOLN
10.0000 mL | INTRAMUSCULAR | Status: DC | PRN
  Administered 2012-12-17: 10 mL

## 2012-12-17 MED ORDER — HEPARIN SOD (PORK) LOCK FLUSH 100 UNIT/ML IV SOLN
500.0000 [IU] | Freq: Once | INTRAVENOUS | Status: AC | PRN
  Administered 2012-12-17: 500 [IU]
  Filled 2012-12-17: qty 5

## 2012-12-17 MED ORDER — SODIUM CHLORIDE 0.9 % IV SOLN
Freq: Once | INTRAVENOUS | Status: AC
  Administered 2012-12-17: 8 mg via INTRAVENOUS
  Filled 2012-12-17: qty 4

## 2012-12-17 NOTE — Progress Notes (Signed)
Tolerated chemo well.  To return in am for Day 2.

## 2012-12-18 ENCOUNTER — Encounter (HOSPITAL_COMMUNITY): Payer: Self-pay

## 2012-12-18 ENCOUNTER — Encounter (HOSPITAL_BASED_OUTPATIENT_CLINIC_OR_DEPARTMENT_OTHER): Payer: Medicare Other

## 2012-12-18 VITALS — BP 144/75 | HR 91 | Temp 97.3°F | Resp 18

## 2012-12-18 DIAGNOSIS — C569 Malignant neoplasm of unspecified ovary: Secondary | ICD-10-CM

## 2012-12-18 DIAGNOSIS — N9489 Other specified conditions associated with female genital organs and menstrual cycle: Secondary | ICD-10-CM

## 2012-12-18 DIAGNOSIS — I10 Essential (primary) hypertension: Secondary | ICD-10-CM

## 2012-12-18 DIAGNOSIS — C801 Malignant (primary) neoplasm, unspecified: Secondary | ICD-10-CM

## 2012-12-18 DIAGNOSIS — Z5111 Encounter for antineoplastic chemotherapy: Secondary | ICD-10-CM

## 2012-12-18 DIAGNOSIS — R109 Unspecified abdominal pain: Secondary | ICD-10-CM

## 2012-12-18 DIAGNOSIS — C786 Secondary malignant neoplasm of retroperitoneum and peritoneum: Secondary | ICD-10-CM

## 2012-12-18 DIAGNOSIS — C50919 Malignant neoplasm of unspecified site of unspecified female breast: Secondary | ICD-10-CM

## 2012-12-18 DIAGNOSIS — IMO0002 Reserved for concepts with insufficient information to code with codable children: Secondary | ICD-10-CM

## 2012-12-18 MED ORDER — SODIUM CHLORIDE 0.9 % IV SOLN
Freq: Once | INTRAVENOUS | Status: AC
  Administered 2012-12-18: 11:00:00 via INTRAVENOUS

## 2012-12-18 MED ORDER — HEPARIN SOD (PORK) LOCK FLUSH 100 UNIT/ML IV SOLN
500.0000 [IU] | Freq: Once | INTRAVENOUS | Status: AC | PRN
  Administered 2012-12-18: 500 [IU]
  Filled 2012-12-18: qty 5

## 2012-12-18 MED ORDER — SODIUM CHLORIDE 0.9 % IJ SOLN
10.0000 mL | INTRAMUSCULAR | Status: DC | PRN
  Administered 2012-12-18: 10 mL

## 2012-12-18 MED ORDER — DEXAMETHASONE SODIUM PHOSPHATE 10 MG/ML IJ SOLN: 10.0000 mg | Freq: Once | INTRAMUSCULAR | Status: DC

## 2012-12-18 MED ORDER — SODIUM CHLORIDE 0.9 % IV SOLN
120.0000 mg/m2 | Freq: Once | INTRAVENOUS | Status: AC
  Administered 2012-12-18: 230 mg via INTRAVENOUS
  Filled 2012-12-18: qty 11.5

## 2012-12-18 MED ORDER — SODIUM CHLORIDE 0.9 % IV SOLN
Freq: Once | INTRAVENOUS | Status: AC
  Administered 2012-12-18: 8 mg via INTRAVENOUS
  Filled 2012-12-18: qty 4

## 2012-12-18 MED ORDER — SODIUM CHLORIDE 0.9 % IV SOLN: 8.0000 mg | Freq: Once | INTRAVENOUS | Status: DC

## 2012-12-18 NOTE — Progress Notes (Signed)
[facility] OFFICE PROGRESS NOTE  Rita Pizza, MD  [address] [place] [address]  DIAGNOSIS: Ovarian cancer, unspecified laterality  Chief Complaint  Patient presents with  . Ovarian Cancer    CURRENT THERAPY: Etoposide IV daily for 3 days every 4 weeks, cycle 1 day 2 today.  INTERVAL HISTORY: Rita Humphrey 72 y.o. female returns for continuation of salvage chemotherapy for persistent ovarian cancer, status post preoperative carboplatin and Taxol followed by surgical debulking with minimal residual disease. She tolerated this treatment well with no nausea, vomiting, fever, night sweats, abdominal pain, diarrhea, abdominal distention, lower extremity swelling or redness, PND, orthopnea, or palpitations. Upper extremity paresthesias have improved the lower extremities are still severe.  MEDICAL HISTORY: Past Medical History  Diagnosis Date  . Hypertension   . Hyperlipemia   . Thyroid disease   . Asthma   . Adnexal mass 05/16/2012  . 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 Cancer     [place]  . Breast cancer     s/p bilateral mastectomy [id number]  . Ovarian cancer 05/17/2012  . 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 Peripheral vascular disease 2013    hx of DVT right leg  . History of kidney stones   . Anemia   . Platelets decreased april 2014     1 transfusion given before chemo  . Deafness in left ear   . Numbness in feet   . Numbness of fingers of both hands   . Dizziness     occasional    INTERIM HISTORY: has Hypokalemia; Abdominal pain; HTN (hypertension); Hyperlipemia; Low back pain; Neck pain; Ascites; Adnexal mass; Unspecified constipation; Thrombocytosis; Ovarian cancer; Omental metastasis; Nausea with vomiting; Peripheral neuropathy, secondary to chemotherapy; Ovarian cancer; Cancer; and Breast cancer on her problem list.    ALLERGIES:  is allergic to gabapentin; penicillins; carboplatin;  and morphine and related.  MEDICATIONS: has a current medication list which includes the following prescription(s): amlodipine, vitamin d3, dexamethasone, etoposide, lidocaine-prilocaine, ondansetron, oxycodone-acetaminophen, pegfilgrastim, potassium chloride sa, prochlorperazine, tetrahydroz-dextran-peg-povid, and vitamin b-6, and the following Facility-Administered Medications: etoposide (VEPESID) 230 mg in sodium chloride 0.9 % 600 mL chemo infusion, heparin lock flush, and sodium chloride.  SURGICAL HISTORY:  Past Surgical History  Procedure Laterality Date  . Thyroid lobectomy    . Foot surgery      2 spurs taken out of foot  . Varicose vein surgery    . Bladder tack surgery     . Tonsillectomy    . Mastectomy      bilateral   . Laparotomy Bilateral 06/05/2012    Procedure: EXPLORATORY LAPAROTOMY WITH PELVIC TUMOR BIOPSY;  Surgeon: Rejeana Brock A. Duard Brady, MD;  Location: [facility];  Service: Gynecology;  Laterality: Bilateral;  . Abdominal hysterectomy  1980  . Vascular surgery      veins stripped from both legs  . Portacath placement Left 06/25/2012    Procedure: INSERTION PORT-A-CATH;  Surgeon: Dalia Heading, MD;  Location: [facility];  Service: General;  Laterality: Left;  . Laparoscopy N/A 10/30/2012    Procedure: LAPAROSCOPY DIAGNOSTIC, EXPLORATORY LAPAROTOMY, BILATERAL SALPINGO-OOPHORECTOMY, TUMOR DEBULKING;  Surgeon: Rejeana Brock A. Duard Brady, MD;  Location: [facility];  Service: Gynecology;  Laterality: N/A;    FAMILY HISTORY: family history includes Cancer in her mother; Heart attack in her brother; Hypertension in her father and sister.  SOCIAL HISTORY:  reports that she has never smoked. She has never  used smokeless tobacco. She reports that she does not drink alcohol or use illicit drugs.  REVIEW OF SYSTEMS:  Other than that discussed above is noncontributory.  PHYSICAL EXAMINATION: ECOG PERFORMANCE STATUS: 1 - Symptomatic but completely ambulatory  There were no vitals taken for this  visit.  GENERAL:alert, no distress and comfortable SKIN: skin color, texture, turgor are normal, no rashes or significant lesions EYES: PERLA; Conjunctiva are pink and non-injected, sclera clear OROPHARYNX:no exudate, no erythema on lips, buccal mucosa, or tongue. NECK: supple, thyroid normal size, non-tender, without nodularity. No masses CHEST: AP diameter normal. LifePort in place. LYMPH:  no palpable lymphadenopathy in the cervical, axillary or inguinal LUNGS: clear to auscultation and percussion with normal breathing effort HEART: regular rate & rhythm and no murmurs and no lower extremity edema ABDOMEN:abdomen soft, non-tender and normal bowel sounds. Surgical wound well-healed with no free fluid wave or shifting dullness. No masses felt. MUSCULOSKELETAL:no cyanosis of digits and no clubbing. Range of motion normal.  NEURO: alert & oriented x 3 with fluent speech, no focal motor/sensory deficits   LABORATORY DATA: Infusion on 12/17/2012  Component Date Value Range Status  . WBC 12/17/2012 10.8* 4.0 - 10.5 K/uL Final  . RBC 12/17/2012 4.34  3.87 - 5.11 MIL/uL Final  . Hemoglobin 12/17/2012 13.6  12.0 - 15.0 g/dL Final  . HCT 40/98/1191 41.0  36.0 - 46.0 % Final  . MCV 12/17/2012 94.5  78.0 - 100.0 fL Final  . MCH 12/17/2012 31.3  26.0 - 34.0 pg Final  . MCHC 12/17/2012 33.2  30.0 - 36.0 g/dL Final  . RDW 47/82/9562 12.8  11.5 - 15.5 % Final  . Platelets 12/17/2012 311  150 - 400 K/uL Final  . Neutrophils Relative % 12/17/2012 65  43 - 77 % Final  . Neutro Abs 12/17/2012 7.0  1.7 - 7.7 K/uL Final  . Lymphocytes Relative 12/17/2012 26  12 - 46 % Final  . Lymphs Abs 12/17/2012 2.8  0.7 - 4.0 K/uL Final  . Monocytes Relative 12/17/2012 7  3 - 12 % Final  . Monocytes Absolute 12/17/2012 0.7  0.1 - 1.0 K/uL Final  . Eosinophils Relative 12/17/2012 2  0 - 5 % Final  . Eosinophils Absolute 12/17/2012 0.2  0.0 - 0.7 K/uL Final  . Basophils Relative 12/17/2012 0  0 - 1 % Final  .  Basophils Absolute 12/17/2012 0.0  0.0 - 0.1 K/uL Final  Office Visit on 12/05/2012  Component Date Value Range Status  . WBC 12/05/2012 8.4  4.0 - 10.5 K/uL Final  . RBC 12/05/2012 4.33  3.87 - 5.11 MIL/uL Final  . Hemoglobin 12/05/2012 13.8  12.0 - 15.0 g/dL Final  . HCT 13/10/6576 41.2  36.0 - 46.0 % Final  . MCV 12/05/2012 95.2  78.0 - 100.0 fL Final  . MCH 12/05/2012 31.9  26.0 - 34.0 pg Final  . MCHC 12/05/2012 33.5  30.0 - 36.0 g/dL Final  . RDW 46/96/2952 12.8  11.5 - 15.5 % Final  . Platelets 12/05/2012 300  150 - 400 K/uL Final  . Neutrophils Relative % 12/05/2012 50  43 - 77 % Final  . Neutro Abs 12/05/2012 4.2  1.7 - 7.7 K/uL Final  . Lymphocytes Relative 12/05/2012 39  12 - 46 % Final  . Lymphs Abs 12/05/2012 3.2  0.7 - 4.0 K/uL Final  . Monocytes Relative 12/05/2012 7  3 - 12 % Final  . Monocytes Absolute 12/05/2012 0.6  0.1 - 1.0 K/uL Final  .  Eosinophils Relative 12/05/2012 4  0 - 5 % Final  . Eosinophils Absolute 12/05/2012 0.3  0.0 - 0.7 K/uL Final  . Basophils Relative 12/05/2012 0  0 - 1 % Final  . Basophils Absolute 12/05/2012 0.0  0.0 - 0.1 K/uL Final  . Sodium 12/05/2012 138  135 - 145 mEq/L Final  . Potassium 12/05/2012 4.0  3.5 - 5.1 mEq/L Final  . Chloride 12/05/2012 103  96 - 112 mEq/L Final  . CO2 12/05/2012 24  19 - 32 mEq/L Final  . Glucose, Bld 12/05/2012 95  70 - 99 mg/dL Final  . BUN 09/81/1914 16  6 - 23 mg/dL Final  . Creatinine, Ser 12/05/2012 0.68  0.50 - 1.10 mg/dL Final  . Calcium 78/29/5621 10.0  8.4 - 10.5 mg/dL Final  . Total Protein 12/05/2012 7.5  6.0 - 8.3 g/dL Final  . Albumin 30/86/5784 3.9  3.5 - 5.2 g/dL Final  . AST 69/62/9528 20  0 - 37 U/L Final  . ALT 12/05/2012 22  0 - 35 U/L Final  . Alkaline Phosphatase 12/05/2012 89  39 - 117 U/L Final  . Total Bilirubin 12/05/2012 0.3  0.3 - 1.2 mg/dL Final  . GFR calc non Af Amer 12/05/2012 85* >90 mL/min Final  . GFR calc Af Amer 12/05/2012 >90  >90 mL/min Final   Comment: (NOTE)                           The eGFR has been calculated using the CKD EPI equation.                          This calculation has not been validated in all clinical situations.                          eGFR's persistently <90 mL/min signify possible Chronic Kidney                          Disease.  . CA 125 12/05/2012 15.2  0.0 - 30.2 U/mL Final   Performed at [facility]  . Ferritin 12/05/2012 100  10 - 291 ng/mL Final   Performed at [facility]    PATHOLOGY:  Urinalysis    Component Value Date/Time   COLORURINE YELLOW 07/03/2012 1158   APPEARANCEUR CLEAR 07/03/2012 1158   LABSPEC 1.020 07/03/2012 1158   PHURINE 5.5 07/03/2012 1158   GLUCOSEU 100* 07/03/2012 1158   HGBUR NEGATIVE 07/03/2012 1158   BILIRUBINUR SMALL* 07/03/2012 1158   KETONESUR NEGATIVE 07/03/2012 1158   PROTEINUR 100* 07/03/2012 1158   UROBILINOGEN 4.0* 07/03/2012 1158   NITRITE POSITIVE* 07/03/2012 1158   LEUKOCYTESUR TRACE* 07/03/2012 1158    RADIOGRAPHIC STUDIES: No results found.  ASSESSMENT: *1. Ovarian cancer, stage III, status post neoadjuvant chemotherapy followed by debulking laparoscopic surgery with optimal results, for additional treatment.  #2.ChemoFX analysis on tumor cells revealed sensitivity to platinum agents and nonresponsive to gemcitabine. #3. Good tolerance of initial dose of etoposide. #4.Allergic reaction to carboplatin during last cycle of treatment characterized by shortness of breath, wheezing, and diffuse erythema of the skin #5.According to ChemoFX responsiveness is predicted by docetaxel, paclitaxel, etoposide, and platinum. Since the patient had significant neuropathy coupled with allergic reaction to carboplatin, etoposide for 3 days every 21-28 days was started yesterday.   PLAN: #1. Day 2 cycle 1  of etoposide today with day 3 tomorrow and Neulasta 6 mg subcutaneously on day 4. #2. Patient was warned about the potential for bone pain secondary to Neulasta and does have  analgesics at home to use. #3. She was told to call should she develop fever and will be seen again on 12/27/2012 with CBC.   All questions were answered. The patient knows to call the clinic with any problems, questions or concerns. We can certainly see the patient much sooner if necessary.   I spent 25 minutes counseling the patient face to face. The total time spent in the appointment was 30 minutes.    Maurilio Lovely, MD 12/18/2012 12:39 PM

## 2012-12-19 ENCOUNTER — Encounter (HOSPITAL_BASED_OUTPATIENT_CLINIC_OR_DEPARTMENT_OTHER): Payer: Medicare Other

## 2012-12-19 VITALS — BP 136/66 | HR 74 | Temp 97.8°F | Resp 18

## 2012-12-19 DIAGNOSIS — Z5111 Encounter for antineoplastic chemotherapy: Secondary | ICD-10-CM

## 2012-12-19 DIAGNOSIS — C569 Malignant neoplasm of unspecified ovary: Secondary | ICD-10-CM

## 2012-12-19 DIAGNOSIS — C50919 Malignant neoplasm of unspecified site of unspecified female breast: Secondary | ICD-10-CM

## 2012-12-19 DIAGNOSIS — I10 Essential (primary) hypertension: Secondary | ICD-10-CM

## 2012-12-19 DIAGNOSIS — N9489 Other specified conditions associated with female genital organs and menstrual cycle: Secondary | ICD-10-CM

## 2012-12-19 DIAGNOSIS — IMO0002 Reserved for concepts with insufficient information to code with codable children: Secondary | ICD-10-CM

## 2012-12-19 DIAGNOSIS — C801 Malignant (primary) neoplasm, unspecified: Secondary | ICD-10-CM

## 2012-12-19 DIAGNOSIS — C786 Secondary malignant neoplasm of retroperitoneum and peritoneum: Secondary | ICD-10-CM

## 2012-12-19 DIAGNOSIS — R109 Unspecified abdominal pain: Secondary | ICD-10-CM

## 2012-12-19 MED ORDER — HEPARIN SOD (PORK) LOCK FLUSH 100 UNIT/ML IV SOLN
INTRAVENOUS | Status: AC
  Filled 2012-12-19: qty 5

## 2012-12-19 MED ORDER — HEPARIN SOD (PORK) LOCK FLUSH 100 UNIT/ML IV SOLN
500.0000 [IU] | Freq: Once | INTRAVENOUS | Status: AC | PRN
  Administered 2012-12-19: 500 [IU]

## 2012-12-19 MED ORDER — SODIUM CHLORIDE 0.9 % IJ SOLN
10.0000 mL | INTRAMUSCULAR | Status: DC | PRN
  Administered 2012-12-19 (×2): 10 mL

## 2012-12-19 MED ORDER — SODIUM CHLORIDE 0.9 % IV SOLN
Freq: Once | INTRAVENOUS | Status: AC
  Administered 2012-12-19: 11:00:00 via INTRAVENOUS

## 2012-12-19 MED ORDER — SODIUM CHLORIDE 0.9 % IV SOLN
120.0000 mg/m2 | Freq: Once | INTRAVENOUS | Status: AC
  Administered 2012-12-19: 230 mg via INTRAVENOUS
  Filled 2012-12-19: qty 11.5

## 2012-12-19 MED ORDER — SODIUM CHLORIDE 0.9 % IV SOLN
Freq: Once | INTRAVENOUS | Status: AC
  Administered 2012-12-19: 8 mg via INTRAVENOUS
  Filled 2012-12-19: qty 4

## 2012-12-19 NOTE — Progress Notes (Signed)
Pt. Tolerated chemo well, portacath, flushed w/ ns 20cc, flushed w/ 5 cc heparin. D/c per policy Left with family member.

## 2012-12-20 ENCOUNTER — Encounter (HOSPITAL_BASED_OUTPATIENT_CLINIC_OR_DEPARTMENT_OTHER): Payer: Medicare Other

## 2012-12-20 VITALS — BP 127/76 | HR 74 | Temp 97.6°F | Resp 18

## 2012-12-20 DIAGNOSIS — C569 Malignant neoplasm of unspecified ovary: Secondary | ICD-10-CM

## 2012-12-20 DIAGNOSIS — Z5189 Encounter for other specified aftercare: Secondary | ICD-10-CM

## 2012-12-20 MED ORDER — PEGFILGRASTIM INJECTION 6 MG/0.6ML
SUBCUTANEOUS | Status: AC
  Filled 2012-12-20: qty 0.6

## 2012-12-20 MED ORDER — PEGFILGRASTIM INJECTION 6 MG/0.6ML
6.0000 mg | Freq: Once | SUBCUTANEOUS | Status: AC
  Administered 2012-12-20: 6 mg via SUBCUTANEOUS

## 2012-12-20 MED ORDER — PEGFILGRASTIM INJECTION 6 MG/0.6ML: 6.0000 mg | Freq: Once | SUBCUTANEOUS | Status: DC

## 2012-12-20 NOTE — Progress Notes (Signed)
Rita Humphrey presents today for injection per MD orders. Neulasta 6mg  administered SQ in right Abdomen. Administration without incident. Patient tolerated well.

## 2012-12-24 ENCOUNTER — Encounter: Payer: Self-pay | Admitting: Hematology and Oncology

## 2012-12-27 ENCOUNTER — Encounter (HOSPITAL_BASED_OUTPATIENT_CLINIC_OR_DEPARTMENT_OTHER): Payer: Medicare Other

## 2012-12-27 ENCOUNTER — Encounter (HOSPITAL_COMMUNITY): Payer: Self-pay

## 2012-12-27 VITALS — BP 144/85 | HR 88 | Temp 98.1°F | Resp 16 | Wt 188.6 lb

## 2012-12-27 DIAGNOSIS — I10 Essential (primary) hypertension: Secondary | ICD-10-CM

## 2012-12-27 DIAGNOSIS — G622 Polyneuropathy due to other toxic agents: Secondary | ICD-10-CM

## 2012-12-27 DIAGNOSIS — IMO0002 Reserved for concepts with insufficient information to code with codable children: Secondary | ICD-10-CM

## 2012-12-27 DIAGNOSIS — C569 Malignant neoplasm of unspecified ovary: Secondary | ICD-10-CM

## 2012-12-27 DIAGNOSIS — R109 Unspecified abdominal pain: Secondary | ICD-10-CM

## 2012-12-27 MED ORDER — DULOXETINE HCL 30 MG PO CPEP: 30.0000 mg | ORAL_CAPSULE | Freq: Every day | ORAL | Status: DC

## 2012-12-27 NOTE — Progress Notes (Signed)
[facility]  OFFICE PROGRESS NOTE  [place], [facility], [place]  [address] [place] [address]  DIAGNOSIS: Ovarian cancer, unspecified laterality - Plan: CBC with Differential, CBC with Differential, CBC with Differential, Comprehensive metabolic panel, Cancer antigen 27.29  Peripheral neuropathy, secondary to chemotherapy  HTN (hypertension)  Chief Complaint  Patient presents with  . Ovarian Cancer    chemo followup  . Peripheral Neuropathy    CURRENT THERAPY: VP-16 for 3 days every 3 weeks, status post first cycle last week with Neulasta support.  INTERVAL HISTORY: Rita Humphrey 72 y.o. female returns for followup after initiation of salvage chemotherapy for ovarian cancer, status post neoadjuvant therapy with carboplatin and Taxol, status post debulking surgery with minimal residual disease. She has noted increased tingling in both upper extremities despite the fact that VP-16 should only cause neuropathy in about 1% of cases. This is probably residual from her previous Taxol and carboplatin. She's also had some increase in left lower quadrant abdominal pain but with regular bowel movements and no diarrhea, rectal bleeding, or incontinence. She had 3 days of nausea which was relieved by Compazine. Neuropathy is primarily affecting her fingers but she is not having any problems buttoning buttons, using eating utensils, or hand writing. She denies any fever, night sweats, or severe bone pain from Neulasta.  MEDICAL HISTORY: Past Medical History  Diagnosis Date  . Hypertension   . Hyperlipemia   . Thyroid disease   . Asthma   . Adnexal mass 05/16/2012  . 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 Cancer     [place]  . Breast cancer     s/p bilateral mastectomy [id number]  . Ovarian cancer 05/17/2012  . 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 Peripheral vascular disease 2013    hx of DVT right leg   . History of kidney stones   . Anemia   . Platelets decreased april 2014     1 transfusion given before chemo  . Deafness in left ear   . Numbness in feet   . Numbness of fingers of both hands   . Dizziness     occasional    INTERIM HISTORY: has Hypokalemia; Abdominal pain; HTN (hypertension); Hyperlipemia; Low back pain; Neck pain; Ascites; Adnexal mass; Unspecified constipation; Thrombocytosis; Ovarian cancer; Omental metastasis; Nausea with vomiting; Peripheral neuropathy, secondary to chemotherapy; Ovarian cancer; Cancer; and Breast cancer on her problem list.    ALLERGIES:  is allergic to gabapentin; penicillins; carboplatin; and morphine and related.  MEDICATIONS: has a current medication list which includes the following prescription(s): amlodipine, vitamin d3, dexamethasone, etoposide, lidocaine-prilocaine, ondansetron, oxycodone-acetaminophen, pegfilgrastim, potassium chloride sa, prochlorperazine, tetrahydroz-dextran-peg-povid, vitamin b-6, and duloxetine.  SURGICAL HISTORY:  Past Surgical History  Procedure Laterality Date  . Thyroid lobectomy    . Foot surgery      2 spurs taken out of foot  . Varicose vein surgery    . Bladder tack surgery     . Tonsillectomy    . Mastectomy      bilateral   . Laparotomy Bilateral 06/05/2012    Procedure: EXPLORATORY LAPAROTOMY WITH PELVIC TUMOR BIOPSY;  Surgeon: Rejeana Brock A. Duard Brady, MD;  Location: [facility];  Service: Gynecology;  Laterality: Bilateral;  . Abdominal hysterectomy  1980  . Vascular surgery      veins stripped from both legs  . Portacath placement Left 06/25/2012  Procedure: INSERTION PORT-A-CATH;  Surgeon: Dalia Heading, MD;  Location: [facility];  Service: General;  Laterality: Left;  . Laparoscopy N/A 10/30/2012    Procedure: LAPAROSCOPY DIAGNOSTIC, EXPLORATORY LAPAROTOMY, BILATERAL SALPINGO-OOPHORECTOMY, TUMOR DEBULKING;  Surgeon: Rejeana Brock A. Duard Brady, MD;  Location: [facility];  Service: Gynecology;  Laterality: N/A;    FAMILY  HISTORY: family history includes Cancer in her mother; Heart attack in her brother; Hypertension in her father and sister.  SOCIAL HISTORY:  reports that she has never smoked. She has never used smokeless tobacco. She reports that she does not drink alcohol or use illicit drugs.  REVIEW OF SYSTEMS:  Other than that discussed above is noncontributory.  PHYSICAL EXAMINATION: ECOG PERFORMANCE STATUS: 1 - Symptomatic but completely ambulatory  Blood pressure 144/85, pulse 88, temperature 98.1 F (36.7 C), temperature source Oral, resp. rate 16, weight 188 lb 9.6 oz (85.548 kg).  GENERAL:alert, no distress and comfortable SKIN: skin color, texture, turgor are normal, no rashes or significant lesions EYES: PERLA; Conjunctiva are pink and non-injected, sclera clear OROPHARYNX:no exudate, no erythema on lips, buccal mucosa, or tongue. NECK: supple, thyroid normal size, non-tender, without nodularity. No masses CHEST: LifePort in place. No breast masses. LYMPH:  no palpable lymphadenopathy in the cervical, axillary or inguinal LUNGS: clear to auscultation and percussion with normal breathing effort HEART: regular rate & rhythm and no murmurs and no lower extremity edema ABDOMEN:abdomen soft, non-tender and normal bowel sounds. Minimal left lower quadrant discomfort without rebound tenderness. MUSCULOSKELETAL:no cyanosis of digits and no clubbing. Range of motion normal.  NEURO: alert & oriented x 3 with fluent speech, no focal motor/sensory deficits.   LABORATORY DATA: Infusion on 12/17/2012  Component Date Value Range Status  . WBC 12/17/2012 10.8* 4.0 - 10.5 K/uL Final  . RBC 12/17/2012 4.34  3.87 - 5.11 MIL/uL Final  . Hemoglobin 12/17/2012 13.6  12.0 - 15.0 g/dL Final  . HCT 45/40/9811 41.0  36.0 - 46.0 % Final  . MCV 12/17/2012 94.5  78.0 - 100.0 fL Final  . MCH 12/17/2012 31.3  26.0 - 34.0 pg Final  . MCHC 12/17/2012 33.2  30.0 - 36.0 g/dL Final  . RDW 91/47/8295 12.8  11.5 - 15.5 %  Final  . Platelets 12/17/2012 311  150 - 400 K/uL Final  . Neutrophils Relative % 12/17/2012 65  43 - 77 % Final  . Neutro Abs 12/17/2012 7.0  1.7 - 7.7 K/uL Final  . Lymphocytes Relative 12/17/2012 26  12 - 46 % Final  . Lymphs Abs 12/17/2012 2.8  0.7 - 4.0 K/uL Final  . Monocytes Relative 12/17/2012 7  3 - 12 % Final  . Monocytes Absolute 12/17/2012 0.7  0.1 - 1.0 K/uL Final  . Eosinophils Relative 12/17/2012 2  0 - 5 % Final  . Eosinophils Absolute 12/17/2012 0.2  0.0 - 0.7 K/uL Final  . Basophils Relative 12/17/2012 0  0 - 1 % Final  . Basophils Absolute 12/17/2012 0.0  0.0 - 0.1 K/uL Final  Office Visit on 12/05/2012  Component Date Value Range Status  . WBC 12/05/2012 8.4  4.0 - 10.5 K/uL Final  . RBC 12/05/2012 4.33  3.87 - 5.11 MIL/uL Final  . Hemoglobin 12/05/2012 13.8  12.0 - 15.0 g/dL Final  . HCT 62/13/0865 41.2  36.0 - 46.0 % Final  . MCV 12/05/2012 95.2  78.0 - 100.0 fL Final  . MCH 12/05/2012 31.9  26.0 - 34.0 pg Final  . MCHC 12/05/2012 33.5  30.0 - 36.0 g/dL  Final  . RDW 12/05/2012 12.8  11.5 - 15.5 % Final  . Platelets 12/05/2012 300  150 - 400 K/uL Final  . Neutrophils Relative % 12/05/2012 50  43 - 77 % Final  . Neutro Abs 12/05/2012 4.2  1.7 - 7.7 K/uL Final  . Lymphocytes Relative 12/05/2012 39  12 - 46 % Final  . Lymphs Abs 12/05/2012 3.2  0.7 - 4.0 K/uL Final  . Monocytes Relative 12/05/2012 7  3 - 12 % Final  . Monocytes Absolute 12/05/2012 0.6  0.1 - 1.0 K/uL Final  . Eosinophils Relative 12/05/2012 4  0 - 5 % Final  . Eosinophils Absolute 12/05/2012 0.3  0.0 - 0.7 K/uL Final  . Basophils Relative 12/05/2012 0  0 - 1 % Final  . Basophils Absolute 12/05/2012 0.0  0.0 - 0.1 K/uL Final  . Sodium 12/05/2012 138  135 - 145 mEq/L Final  . Potassium 12/05/2012 4.0  3.5 - 5.1 mEq/L Final  . Chloride 12/05/2012 103  96 - 112 mEq/L Final  . CO2 12/05/2012 24  19 - 32 mEq/L Final  . Glucose, Bld 12/05/2012 95  70 - 99 mg/dL Final  . BUN 16/12/9602 16  6 - 23 mg/dL  Final  . Creatinine, Ser 12/05/2012 0.68  0.50 - 1.10 mg/dL Final  . Calcium 54/11/8117 10.0  8.4 - 10.5 mg/dL Final  . Total Protein 12/05/2012 7.5  6.0 - 8.3 g/dL Final  . Albumin 14/78/2956 3.9  3.5 - 5.2 g/dL Final  . AST 21/30/8657 20  0 - 37 U/L Final  . ALT 12/05/2012 22  0 - 35 U/L Final  . Alkaline Phosphatase 12/05/2012 89  39 - 117 U/L Final  . Total Bilirubin 12/05/2012 0.3  0.3 - 1.2 mg/dL Final  . GFR calc non Af Amer 12/05/2012 85* >90 mL/min Final  . GFR calc Af Amer 12/05/2012 >90  >90 mL/min Final   Comment: (NOTE)                          The eGFR has been calculated using the CKD EPI equation.                          This calculation has not been validated in all clinical situations.                          eGFR's persistently <90 mL/min signify possible Chronic Kidney                          Disease.  . CA 125 12/05/2012 15.2  0.0 - 30.2 U/mL Final   Performed at [facility]  . Ferritin 12/05/2012 100  10 - 291 ng/mL Final   Performed at [facility]    PATHOLOGY:  Urinalysis    Component Value Date/Time   COLORURINE YELLOW 07/03/2012 1158   APPEARANCEUR CLEAR 07/03/2012 1158   LABSPEC 1.020 07/03/2012 1158   PHURINE 5.5 07/03/2012 1158   GLUCOSEU 100* 07/03/2012 1158   HGBUR NEGATIVE 07/03/2012 1158   BILIRUBINUR SMALL* 07/03/2012 1158   KETONESUR NEGATIVE 07/03/2012 1158   PROTEINUR 100* 07/03/2012 1158   UROBILINOGEN 4.0* 07/03/2012 1158   NITRITE POSITIVE* 07/03/2012 1158   LEUKOCYTESUR TRACE* 07/03/2012 1158    RADIOGRAPHIC STUDIES: No results found.  ASSESSMENT:  #1. Ovarian cancer, stage III, status post  neoadjuvant chemotherapy followed by debulking laparoscopic surgery with optimal results but with residual disease, status post first cycle of VP-16 given daily from 1013 through 12/19/2012 followed by Neulasta 6 mg subcutaneously. #2. Peripheral neuropathy from previous chemotherapy, apparently worsening at this time despite the fact  that VP-16 only causes neuropathy in about 1% of patients. #3. History of allergic reaction to carboplatin and for that reason that drug is not being utilized now.   PLAN:  #1. Trial of Cymbalta 30 mg at bedtime for neuropathy. #2. Followup on 01/08/2013 at which time cycle 2 will be initiated. Plan is to give 3 cycles and performed PET scan.   All questions were answered. The patient knows to call the clinic with any problems, questions or concerns. We can certainly see the patient much sooner if necessary.   I spent 25 minutes counseling the patient face to face. The total time spent in the appointment was 30 minutes.    Maurilio Lovely, MD 12/27/2012 1:33 PM

## 2012-12-27 NOTE — Patient Instructions (Signed)
[facility] Discharge Instructions  RECOMMENDATIONS MADE BY THE CONSULTANT AND ANY TEST RESULTS WILL BE SENT TO YOUR REFERRING PHYSICIAN.  A Rita Humphrey prescription was sent to your pharmacy for Cymbalta. Take as directed. Continue with current chemotherapy plan as scheduled. Watch for any bleeding or fevers. MD appointment next day of chemotherapy.  Thank you for choosing [facility] to provide your oncology and hematology care.  To afford each patient quality time with our providers, please arrive at least 15 minutes before your scheduled appointment time.  With your help, our goal is to use those 15 minutes to complete the necessary work-up to ensure our physicians have the information they need to help with your evaluation and healthcare recommendations.    Effective January 1st, 2014, we ask that you re-schedule your appointment with our physicians should you arrive 10 or more minutes late for your appointment.  We strive to give you quality time with our providers, and arriving late affects you and other patients whose appointments are after yours.    Again, thank you for choosing [facility].  Our hope is that these requests will decrease the amount of time that you wait before being seen by our physicians.       _____________________________________________________________  Should you have questions after your visit to [facility], please contact our office at [phone] between the hours of 8:30 a.m. and 5:00 p.m.  Voicemails left after 4:30 p.m. will not be returned until the following business day.  For prescription refill requests, have your pharmacy contact our office with your prescription refill request.

## 2013-01-01 ENCOUNTER — Telehealth (HOSPITAL_COMMUNITY): Payer: Self-pay | Admitting: *Deleted

## 2013-01-01 ENCOUNTER — Other Ambulatory Visit (HOSPITAL_COMMUNITY): Payer: Self-pay | Admitting: Oncology

## 2013-01-01 DIAGNOSIS — E876 Hypokalemia: Secondary | ICD-10-CM

## 2013-01-01 MED ORDER — POTASSIUM CHLORIDE CRYS ER 20 MEQ PO TBCR: 20.0000 meq | EXTENDED_RELEASE_TABLET | Freq: Every day | ORAL | Status: DC

## 2013-01-01 NOTE — Telephone Encounter (Signed)
Daughter called and needed refill on potassium please

## 2013-01-07 ENCOUNTER — Encounter (HOSPITAL_COMMUNITY): Payer: Self-pay

## 2013-01-07 ENCOUNTER — Encounter (HOSPITAL_COMMUNITY): Payer: Medicare Other | Attending: Oncology

## 2013-01-07 ENCOUNTER — Encounter (HOSPITAL_BASED_OUTPATIENT_CLINIC_OR_DEPARTMENT_OTHER): Payer: Medicare Other

## 2013-01-07 ENCOUNTER — Ambulatory Visit (HOSPITAL_COMMUNITY): Payer: Medicare Other

## 2013-01-07 VITALS — BP 138/67 | HR 85 | Temp 98.0°F | Resp 18

## 2013-01-07 DIAGNOSIS — C569 Malignant neoplasm of unspecified ovary: Secondary | ICD-10-CM | POA: Insufficient documentation

## 2013-01-07 DIAGNOSIS — C801 Malignant (primary) neoplasm, unspecified: Secondary | ICD-10-CM | POA: Insufficient documentation

## 2013-01-07 DIAGNOSIS — G622 Polyneuropathy due to other toxic agents: Secondary | ICD-10-CM | POA: Insufficient documentation

## 2013-01-07 DIAGNOSIS — IMO0002 Reserved for concepts with insufficient information to code with codable children: Secondary | ICD-10-CM

## 2013-01-07 DIAGNOSIS — G4700 Insomnia, unspecified: Secondary | ICD-10-CM | POA: Insufficient documentation

## 2013-01-07 DIAGNOSIS — C786 Secondary malignant neoplasm of retroperitoneum and peritoneum: Secondary | ICD-10-CM | POA: Insufficient documentation

## 2013-01-07 DIAGNOSIS — Z5111 Encounter for antineoplastic chemotherapy: Secondary | ICD-10-CM

## 2013-01-07 DIAGNOSIS — I10 Essential (primary) hypertension: Secondary | ICD-10-CM | POA: Insufficient documentation

## 2013-01-07 DIAGNOSIS — R109 Unspecified abdominal pain: Secondary | ICD-10-CM | POA: Insufficient documentation

## 2013-01-07 DIAGNOSIS — C50919 Malignant neoplasm of unspecified site of unspecified female breast: Secondary | ICD-10-CM | POA: Insufficient documentation

## 2013-01-07 DIAGNOSIS — N9489 Other specified conditions associated with female genital organs and menstrual cycle: Secondary | ICD-10-CM | POA: Insufficient documentation

## 2013-01-07 LAB — CBC WITH DIFFERENTIAL/PLATELET
Basophils Absolute: 0 10*3/uL (ref 0.0–0.1)
Eosinophils Absolute: 0.1 10*3/uL (ref 0.0–0.7)
Eosinophils Relative: 1 % (ref 0–5)
HCT: 39.8 % (ref 36.0–46.0)
Lymphocytes Relative: 31 % (ref 12–46)
MCH: 30.4 pg (ref 26.0–34.0)
MCV: 92.3 fL (ref 78.0–100.0)
Monocytes Absolute: 1.1 10*3/uL — ABNORMAL HIGH (ref 0.1–1.0)
Monocytes Relative: 12 % (ref 3–12)
Neutrophils Relative %: 56 % (ref 43–77)
Platelets: 376 10*3/uL (ref 150–400)
RDW: 13.3 % (ref 11.5–15.5)
WBC: 8.8 10*3/uL (ref 4.0–10.5)

## 2013-01-07 LAB — COMPREHENSIVE METABOLIC PANEL
AST: 17 U/L (ref 0–37)
Alkaline Phosphatase: 106 U/L (ref 39–117)
BUN: 19 mg/dL (ref 6–23)
CO2: 26 mEq/L (ref 19–32)
Calcium: 10 mg/dL (ref 8.4–10.5)
Chloride: 103 mEq/L (ref 96–112)
Creatinine, Ser: 0.72 mg/dL (ref 0.50–1.10)
GFR calc non Af Amer: 84 mL/min — ABNORMAL LOW (ref >90)
Potassium: 3.9 mEq/L (ref 3.5–5.1)
Sodium: 138 mEq/L (ref 135–145)
Total Bilirubin: 0.2 mg/dL — ABNORMAL LOW (ref 0.3–1.2)

## 2013-01-07 MED ORDER — SODIUM CHLORIDE 0.9 % IV SOLN
Freq: Once | INTRAVENOUS | Status: AC
  Administered 2013-01-07: 8 mg via INTRAVENOUS
  Filled 2013-01-07: qty 4

## 2013-01-07 MED ORDER — SODIUM CHLORIDE 0.9 % IV SOLN
Freq: Once | INTRAVENOUS | Status: AC
  Administered 2013-01-07: 11:00:00 via INTRAVENOUS

## 2013-01-07 MED ORDER — HEPARIN SOD (PORK) LOCK FLUSH 100 UNIT/ML IV SOLN
INTRAVENOUS | Status: AC
  Filled 2013-01-07: qty 5

## 2013-01-07 MED ORDER — SODIUM CHLORIDE 0.9 % IV SOLN
120.0000 mg/m2 | Freq: Once | INTRAVENOUS | Status: AC
  Administered 2013-01-07: 230 mg via INTRAVENOUS
  Filled 2013-01-07: qty 11.5

## 2013-01-07 MED ORDER — HEPARIN SOD (PORK) LOCK FLUSH 100 UNIT/ML IV SOLN
500.0000 [IU] | Freq: Once | INTRAVENOUS | Status: AC | PRN
  Administered 2013-01-07: 500 [IU]
  Filled 2013-01-07: qty 5

## 2013-01-07 NOTE — Patient Instructions (Signed)
.  [facility] Discharge Instructions  RECOMMENDATIONS MADE BY THE CONSULTANT AND ANY TEST RESULTS WILL BE SENT TO YOUR REFERRING PHYSICIAN.  EXAM FINDINGS BY THE PHYSICIAN TODAY AND SIGNS OR SYMPTOMS TO REPORT TO CLINIC OR PRIMARY PHYSICIAN: Exam and findings as discussed by Dr. Zigmund Daniel. Plan is for 4 cycles of this chemo Pet scan after 4 cycles Discussed use of Avastin as a maintenance treatment.  INSTRUCTIONS/FOLLOW-UP: 3 weeks  Thank you for choosing [facility] to provide your oncology and hematology care.  To afford each patient quality time with our providers, please arrive at least 15 minutes before your scheduled appointment time.  With your help, our goal is to use those 15 minutes to complete the necessary work-up to ensure our physicians have the information they need to help with your evaluation and healthcare recommendations.    Effective January 1st, 2014, we ask that you re-schedule your appointment with our physicians should you arrive 10 or more minutes late for your appointment.  We strive to give you quality time with our providers, and arriving late affects you and other patients whose appointments are after yours.    Again, thank you for choosing [facility].  Our hope is that these requests will decrease the amount of time that you wait before being seen by our physicians.       _____________________________________________________________  Should you have questions after your visit to [facility], please contact our office at [phone] between the hours of 8:30 a.m. and 5:00 p.m.  Voicemails left after 4:30 p.m. will not be returned until the following business day.  For prescription refill requests, have your pharmacy contact our office with your prescription refill request.

## 2013-01-07 NOTE — Progress Notes (Signed)
[facility]  OFFICE PROGRESS NOTE  [name], MD  [address] [place] [address]  DIAGNOSIS: Ovarian cancer, unspecified laterality  Chief Complaint  Patient presents with  . Ovarian Cancer    CURRENT THERAPY: VP-16 cycle 2 day 1  INTERVAL HISTORY: Rita Humphrey 72 y.o. female returns for continuation of maintenance chemotherapy for stage 3 ovarian cancer, status post neoadjuvant chemotherapy followed by debulking surgery at which time minimal residual disease was left. Due to severe peripheral neuropathy as well as allergy to carboplatin, treatment with VP-16 was selected based upon ChemoFX sensitivity testing.  Apart from having trouble sleeping resulting in awakening after 2 hours, she feels well. Neuropathy has improved. She denies abdominal pain, nausea, vomiting, diarrhea, constipation, lower extremity swelling or redness, PND, orthopnea, palpitations, headache, or seizures.  MEDICAL HISTORY: Past Medical History  Diagnosis Date  . Hypertension   . Hyperlipemia   . Thyroid disease   . Asthma   . Adnexal mass 05/16/2012  . 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 Cancer     [place]  . Breast cancer     s/p bilateral mastectomy [id number]  . Ovarian cancer 05/17/2012  . 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 Peripheral vascular disease 2013    hx of DVT right leg  . History of kidney stones   . Anemia   . Platelets decreased april 2014     1 transfusion given before chemo  . Deafness in left ear   . Numbness in feet   . Numbness of fingers of both hands   . Dizziness     occasional    INTERIM HISTORY: has Hypokalemia; Abdominal pain; HTN (hypertension); Hyperlipemia; Low back pain; Neck pain; Ascites; Adnexal mass; Unspecified constipation; Thrombocytosis; Ovarian cancer; Omental metastasis; Nausea with vomiting; Peripheral neuropathy, secondary to chemotherapy;  Ovarian cancer; Cancer; and Breast cancer on her problem list.    ALLERGIES:  is allergic to gabapentin; penicillins; carboplatin; and morphine and related.  MEDICATIONS: has a current medication list which includes the following prescription(s): amlodipine, vitamin d3, dexamethasone, duloxetine, etoposide, lidocaine-prilocaine, ondansetron, oxycodone-acetaminophen, pegfilgrastim, potassium chloride sa, prochlorperazine, tetrahydroz-dextran-peg-povid, and vitamin b-6.  SURGICAL HISTORY:  Past Surgical History  Procedure Laterality Date  . Thyroid lobectomy    . Foot surgery      2 spurs taken out of foot  . Varicose vein surgery    . Bladder tack surgery     . Tonsillectomy    . Mastectomy      bilateral   . Laparotomy Bilateral 06/05/2012    Procedure: EXPLORATORY LAPAROTOMY WITH PELVIC TUMOR BIOPSY;  Surgeon: Rejeana Brock A. Duard Brady, MD;  Location: [facility];  Service: Gynecology;  Laterality: Bilateral;  . Abdominal hysterectomy  1980  . Vascular surgery      veins stripped from both legs  . Portacath placement Left 06/25/2012    Procedure: INSERTION PORT-A-CATH;  Surgeon: Dalia Heading, MD;  Location: [facility];  Service: General;  Laterality: Left;  . Laparoscopy N/A 10/30/2012    Procedure: LAPAROSCOPY DIAGNOSTIC, EXPLORATORY LAPAROTOMY, BILATERAL SALPINGO-OOPHORECTOMY, TUMOR DEBULKING;  Surgeon: Rejeana Brock A. Duard Brady, MD;  Location: [facility];  Service: Gynecology;  Laterality: N/A;    FAMILY HISTORY: family history includes Cancer in her mother; Heart attack in her brother; Hypertension in her father and sister.  SOCIAL HISTORY:  reports that she has never smoked. She has never  used smokeless tobacco. She reports that she does not drink alcohol or use illicit drugs.  REVIEW OF SYSTEMS:  Other than that discussed above is noncontributory.  PHYSICAL EXAMINATION: ECOG PERFORMANCE STATUS: 1 - Symptomatic but completely ambulatory  There were no vitals taken for this visit.  GENERAL:alert, no  distress and comfortable SKIN: skin color, texture, turgor are normal, no rashes or significant lesions EYES: PERLA; Conjunctiva are pink and non-injected, sclera clear OROPHARYNX:no exudate, no erythema on lips, buccal mucosa, or tongue. NECK: supple, thyroid normal size, non-tender, without nodularity. No masses CHEST: LifePort in place with no breast masses. LYMPH:  no palpable lymphadenopathy in the cervical, axillary or inguinal LUNGS: clear to auscultation and percussion with normal breathing effort HEART: regular rate & rhythm and no murmurs. ABDOMEN:abdomen soft, non-tender and normal bowel sounds MUSCULOSKELETAL:no cyanosis of digits and no clubbing. Range of motion normal.  NEURO: alert & oriented x 3 with fluent speech, no focal motor/sensory deficits   LABORATORY DATA: Infusion on 01/07/2013  Component Date Value Range Status  . WBC 01/07/2013 8.8  4.0 - 10.5 K/uL Final  . RBC 01/07/2013 4.31  3.87 - 5.11 MIL/uL Final  . Hemoglobin 01/07/2013 13.1  12.0 - 15.0 g/dL Final  . HCT 16/12/9602 39.8  36.0 - 46.0 % Final  . MCV 01/07/2013 92.3  78.0 - 100.0 fL Final  . MCH 01/07/2013 30.4  26.0 - 34.0 pg Final  . MCHC 01/07/2013 32.9  30.0 - 36.0 g/dL Final  . RDW 54/11/8117 13.3  11.5 - 15.5 % Final  . Platelets 01/07/2013 376  150 - 400 K/uL Final  . Neutrophils Relative % 01/07/2013 56  43 - 77 % Final  . Neutro Abs 01/07/2013 4.9  1.7 - 7.7 K/uL Final  . Lymphocytes Relative 01/07/2013 31  12 - 46 % Final  . Lymphs Abs 01/07/2013 2.7  0.7 - 4.0 K/uL Final  . Monocytes Relative 01/07/2013 12  3 - 12 % Final  . Monocytes Absolute 01/07/2013 1.1* 0.1 - 1.0 K/uL Final  . Eosinophils Relative 01/07/2013 1  0 - 5 % Final  . Eosinophils Absolute 01/07/2013 0.1  0.0 - 0.7 K/uL Final  . Basophils Relative 01/07/2013 0  0 - 1 % Final  . Basophils Absolute 01/07/2013 0.0  0.0 - 0.1 K/uL Final  . Sodium 01/07/2013 138  135 - 145 mEq/L Final  . Potassium 01/07/2013 3.9  3.5 - 5.1  mEq/L Final  . Chloride 01/07/2013 103  96 - 112 mEq/L Final  . CO2 01/07/2013 26  19 - 32 mEq/L Final  . Glucose, Bld 01/07/2013 90  70 - 99 mg/dL Final  . BUN 14/78/2956 19  6 - 23 mg/dL Final  . Creatinine, Ser 01/07/2013 0.72  0.50 - 1.10 mg/dL Final  . Calcium 21/30/8657 10.0  8.4 - 10.5 mg/dL Final  . Total Protein 01/07/2013 7.0  6.0 - 8.3 g/dL Final  . Albumin 84/69/6295 3.5  3.5 - 5.2 g/dL Final  . AST 28/41/3244 17  0 - 37 U/L Final  . ALT 01/07/2013 20  0 - 35 U/L Final  . Alkaline Phosphatase 01/07/2013 106  39 - 117 U/L Final  . Total Bilirubin 01/07/2013 0.2* 0.3 - 1.2 mg/dL Final  . GFR calc non Af Amer 01/07/2013 84* >90 mL/min Final  . GFR calc Af Amer 01/07/2013 >90  >90 mL/min Final   Comment: (NOTE)  The eGFR has been calculated using the CKD EPI equation.                          This calculation has not been validated in all clinical situations.                          eGFR's persistently <90 mL/min signify possible Chronic Kidney                          Disease.  Infusion on 12/17/2012  Component Date Value Range Status  . WBC 12/17/2012 10.8* 4.0 - 10.5 K/uL Final  . RBC 12/17/2012 4.34  3.87 - 5.11 MIL/uL Final  . Hemoglobin 12/17/2012 13.6  12.0 - 15.0 g/dL Final  . HCT 16/12/9602 41.0  36.0 - 46.0 % Final  . MCV 12/17/2012 94.5  78.0 - 100.0 fL Final  . MCH 12/17/2012 31.3  26.0 - 34.0 pg Final  . MCHC 12/17/2012 33.2  30.0 - 36.0 g/dL Final  . RDW 54/11/8117 12.8  11.5 - 15.5 % Final  . Platelets 12/17/2012 311  150 - 400 K/uL Final  . Neutrophils Relative % 12/17/2012 65  43 - 77 % Final  . Neutro Abs 12/17/2012 7.0  1.7 - 7.7 K/uL Final  . Lymphocytes Relative 12/17/2012 26  12 - 46 % Final  . Lymphs Abs 12/17/2012 2.8  0.7 - 4.0 K/uL Final  . Monocytes Relative 12/17/2012 7  3 - 12 % Final  . Monocytes Absolute 12/17/2012 0.7  0.1 - 1.0 K/uL Final  . Eosinophils Relative 12/17/2012 2  0 - 5 % Final  . Eosinophils Absolute  12/17/2012 0.2  0.0 - 0.7 K/uL Final  . Basophils Relative 12/17/2012 0  0 - 1 % Final  . Basophils Absolute 12/17/2012 0.0  0.0 - 0.1 K/uL Final    PATHOLOGY:  Urinalysis    Component Value Date/Time   COLORURINE YELLOW 07/03/2012 1158   APPEARANCEUR CLEAR 07/03/2012 1158   LABSPEC 1.020 07/03/2012 1158   PHURINE 5.5 07/03/2012 1158   GLUCOSEU 100* 07/03/2012 1158   HGBUR NEGATIVE 07/03/2012 1158   BILIRUBINUR SMALL* 07/03/2012 1158   KETONESUR NEGATIVE 07/03/2012 1158   PROTEINUR 100* 07/03/2012 1158   UROBILINOGEN 4.0* 07/03/2012 1158   NITRITE POSITIVE* 07/03/2012 1158   LEUKOCYTESUR TRACE* 07/03/2012 1158    RADIOGRAPHIC STUDIES: No results found.  ASSESSMENT:  #1.Ovarian cancer, stage III, status post neoadjuvant chemotherapy followed by debulking laparoscopic surgery with optimal results but with residual disease, status post first cycle of VP-16 given daily from 1013 through 12/19/2012 followed by Neulasta 6 mg subcutaneously For cycle #2 today day 1 of 3 followed by Neulasta on day 4. #2. Neuropathy, improved on Cymbalta. #3. History of allergic reaction to carboplatin, reason that the drug is not being utilized now.   PLAN:  #1. VP-16 for 3 days followed by Neulasta on day 4. #2. Continue Cymbalta 30 mg at bedtime. #3. Followup examination in 3 weeks with plans to give 4 cycles of VP-16 and then performed PET scan. If PET scan is normal, maintenance with Avastin.   All questions were answered. The patient knows to call the clinic with any problems, questions or concerns. We can certainly see the patient much sooner if necessary.   I spent 25 minutes counseling the patient face to face. The total time spent in the appointment was  30 minutes.    Maurilio Lovely, MD 01/07/2013 11:46 AM

## 2013-01-07 NOTE — Progress Notes (Signed)
Tolerated chemo well. 

## 2013-01-08 ENCOUNTER — Encounter (HOSPITAL_BASED_OUTPATIENT_CLINIC_OR_DEPARTMENT_OTHER): Payer: Medicare Other

## 2013-01-08 VITALS — BP 136/75 | HR 84 | Temp 97.6°F | Resp 18 | Wt 189.0 lb

## 2013-01-08 DIAGNOSIS — IMO0002 Reserved for concepts with insufficient information to code with codable children: Secondary | ICD-10-CM

## 2013-01-08 DIAGNOSIS — R109 Unspecified abdominal pain: Secondary | ICD-10-CM

## 2013-01-08 DIAGNOSIS — C786 Secondary malignant neoplasm of retroperitoneum and peritoneum: Secondary | ICD-10-CM

## 2013-01-08 DIAGNOSIS — C801 Malignant (primary) neoplasm, unspecified: Secondary | ICD-10-CM

## 2013-01-08 DIAGNOSIS — Z5111 Encounter for antineoplastic chemotherapy: Secondary | ICD-10-CM

## 2013-01-08 DIAGNOSIS — I10 Essential (primary) hypertension: Secondary | ICD-10-CM

## 2013-01-08 DIAGNOSIS — C50919 Malignant neoplasm of unspecified site of unspecified female breast: Secondary | ICD-10-CM

## 2013-01-08 DIAGNOSIS — C569 Malignant neoplasm of unspecified ovary: Secondary | ICD-10-CM

## 2013-01-08 DIAGNOSIS — N9489 Other specified conditions associated with female genital organs and menstrual cycle: Secondary | ICD-10-CM

## 2013-01-08 LAB — CANCER ANTIGEN 27.29: CA 27.29: 34 U/mL (ref 0–39)

## 2013-01-08 MED ORDER — SODIUM CHLORIDE 0.9 % IJ SOLN: 10.0000 mL | INTRAMUSCULAR | Status: DC | PRN

## 2013-01-08 MED ORDER — SODIUM CHLORIDE 0.9 % IV SOLN
120.0000 mg/m2 | Freq: Once | INTRAVENOUS | Status: AC
  Administered 2013-01-08: 230 mg via INTRAVENOUS
  Filled 2013-01-08: qty 11.5

## 2013-01-08 MED ORDER — SODIUM CHLORIDE 0.9 % IV SOLN
Freq: Once | INTRAVENOUS | Status: AC
  Administered 2013-01-08: 11:00:00 via INTRAVENOUS

## 2013-01-08 MED ORDER — SODIUM CHLORIDE 0.9 % IV SOLN
Freq: Once | INTRAVENOUS | Status: AC
  Administered 2013-01-08: 8 mg via INTRAVENOUS
  Filled 2013-01-08: qty 4

## 2013-01-08 MED ORDER — HEPARIN SOD (PORK) LOCK FLUSH 100 UNIT/ML IV SOLN
500.0000 [IU] | Freq: Once | INTRAVENOUS | Status: AC | PRN
  Administered 2013-01-08: 500 [IU]
  Filled 2013-01-08: qty 5

## 2013-01-08 NOTE — Progress Notes (Signed)
Tolerated well

## 2013-01-09 ENCOUNTER — Encounter (HOSPITAL_BASED_OUTPATIENT_CLINIC_OR_DEPARTMENT_OTHER): Payer: Medicare Other

## 2013-01-09 VITALS — BP 159/88 | HR 76 | Temp 97.0°F | Resp 18 | Wt 188.0 lb

## 2013-01-09 DIAGNOSIS — C786 Secondary malignant neoplasm of retroperitoneum and peritoneum: Secondary | ICD-10-CM

## 2013-01-09 DIAGNOSIS — C50919 Malignant neoplasm of unspecified site of unspecified female breast: Secondary | ICD-10-CM

## 2013-01-09 DIAGNOSIS — R109 Unspecified abdominal pain: Secondary | ICD-10-CM

## 2013-01-09 DIAGNOSIS — IMO0002 Reserved for concepts with insufficient information to code with codable children: Secondary | ICD-10-CM

## 2013-01-09 DIAGNOSIS — I10 Essential (primary) hypertension: Secondary | ICD-10-CM

## 2013-01-09 DIAGNOSIS — Z5111 Encounter for antineoplastic chemotherapy: Secondary | ICD-10-CM

## 2013-01-09 DIAGNOSIS — N9489 Other specified conditions associated with female genital organs and menstrual cycle: Secondary | ICD-10-CM

## 2013-01-09 DIAGNOSIS — C569 Malignant neoplasm of unspecified ovary: Secondary | ICD-10-CM

## 2013-01-09 DIAGNOSIS — C801 Malignant (primary) neoplasm, unspecified: Secondary | ICD-10-CM

## 2013-01-09 MED ORDER — SODIUM CHLORIDE 0.9 % IV SOLN
120.0000 mg/m2 | Freq: Once | INTRAVENOUS | Status: AC
  Administered 2013-01-09: 230 mg via INTRAVENOUS
  Filled 2013-01-09: qty 11.5

## 2013-01-09 MED ORDER — HEPARIN SOD (PORK) LOCK FLUSH 100 UNIT/ML IV SOLN
INTRAVENOUS | Status: AC
  Filled 2013-01-09: qty 5

## 2013-01-09 MED ORDER — SODIUM CHLORIDE 0.9 % IV SOLN
Freq: Once | INTRAVENOUS | Status: AC
  Administered 2013-01-09: 8 mg via INTRAVENOUS
  Filled 2013-01-09: qty 4

## 2013-01-09 MED ORDER — SODIUM CHLORIDE 0.9 % IV SOLN
Freq: Once | INTRAVENOUS | Status: AC
  Administered 2013-01-09: 11:00:00 via INTRAVENOUS

## 2013-01-09 NOTE — Progress Notes (Signed)
.  Rita Humphrey presents today for day 3 of etoposide. No c/o other than slight "heartburn".

## 2013-01-09 NOTE — Progress Notes (Signed)
Port access d/c.  Denies discomfort.  Tolerated etoposide infusion well.

## 2013-01-10 ENCOUNTER — Encounter (HOSPITAL_BASED_OUTPATIENT_CLINIC_OR_DEPARTMENT_OTHER): Payer: Medicare Other

## 2013-01-10 ENCOUNTER — Ambulatory Visit (HOSPITAL_COMMUNITY): Payer: Medicare Other

## 2013-01-10 DIAGNOSIS — C801 Malignant (primary) neoplasm, unspecified: Secondary | ICD-10-CM

## 2013-01-10 DIAGNOSIS — C50919 Malignant neoplasm of unspecified site of unspecified female breast: Secondary | ICD-10-CM

## 2013-01-10 DIAGNOSIS — C569 Malignant neoplasm of unspecified ovary: Secondary | ICD-10-CM

## 2013-01-10 DIAGNOSIS — R109 Unspecified abdominal pain: Secondary | ICD-10-CM

## 2013-01-10 DIAGNOSIS — C786 Secondary malignant neoplasm of retroperitoneum and peritoneum: Secondary | ICD-10-CM

## 2013-01-10 DIAGNOSIS — N9489 Other specified conditions associated with female genital organs and menstrual cycle: Secondary | ICD-10-CM

## 2013-01-10 DIAGNOSIS — I10 Essential (primary) hypertension: Secondary | ICD-10-CM

## 2013-01-10 DIAGNOSIS — IMO0002 Reserved for concepts with insufficient information to code with codable children: Secondary | ICD-10-CM

## 2013-01-10 DIAGNOSIS — Z5189 Encounter for other specified aftercare: Secondary | ICD-10-CM

## 2013-01-10 MED ORDER — PEGFILGRASTIM INJECTION 6 MG/0.6ML
SUBCUTANEOUS | Status: AC
  Filled 2013-01-10: qty 0.6

## 2013-01-10 MED ORDER — PEGFILGRASTIM INJECTION 6 MG/0.6ML
6.0000 mg | Freq: Once | SUBCUTANEOUS | Status: AC
  Administered 2013-01-10: 6 mg via SUBCUTANEOUS

## 2013-01-10 NOTE — Progress Notes (Signed)
Rita Humphrey presents today for injection per MD orders. Neulasta 6mg administered SQ in right Abdomen. Administration without incident. Patient tolerated well.  

## 2013-01-18 ENCOUNTER — Telehealth (HOSPITAL_COMMUNITY): Payer: Self-pay | Admitting: *Deleted

## 2013-01-18 NOTE — Telephone Encounter (Signed)
Complains of burning on urination x 2 days. Using AZO  Discussed with T. Kefalas PA and called in Bactrim DS 1 BiD for 7 days.

## 2013-01-28 ENCOUNTER — Other Ambulatory Visit (HOSPITAL_COMMUNITY): Payer: Self-pay | Admitting: Hematology and Oncology

## 2013-01-28 ENCOUNTER — Encounter (HOSPITAL_BASED_OUTPATIENT_CLINIC_OR_DEPARTMENT_OTHER): Payer: Medicare Other

## 2013-01-28 VITALS — BP 147/73 | HR 92 | Temp 98.3°F | Resp 18 | Wt 190.8 lb

## 2013-01-28 DIAGNOSIS — C569 Malignant neoplasm of unspecified ovary: Secondary | ICD-10-CM

## 2013-01-28 DIAGNOSIS — C801 Malignant (primary) neoplasm, unspecified: Secondary | ICD-10-CM

## 2013-01-28 DIAGNOSIS — IMO0002 Reserved for concepts with insufficient information to code with codable children: Secondary | ICD-10-CM

## 2013-01-28 DIAGNOSIS — I10 Essential (primary) hypertension: Secondary | ICD-10-CM

## 2013-01-28 DIAGNOSIS — R109 Unspecified abdominal pain: Secondary | ICD-10-CM

## 2013-01-28 DIAGNOSIS — C50919 Malignant neoplasm of unspecified site of unspecified female breast: Secondary | ICD-10-CM

## 2013-01-28 DIAGNOSIS — C786 Secondary malignant neoplasm of retroperitoneum and peritoneum: Secondary | ICD-10-CM

## 2013-01-28 DIAGNOSIS — Z5111 Encounter for antineoplastic chemotherapy: Secondary | ICD-10-CM

## 2013-01-28 DIAGNOSIS — N9489 Other specified conditions associated with female genital organs and menstrual cycle: Secondary | ICD-10-CM

## 2013-01-28 LAB — CBC WITH DIFFERENTIAL/PLATELET
Eosinophils Absolute: 0.1 10*3/uL (ref 0.0–0.7)
Eosinophils Relative: 1 % (ref 0–5)
Hemoglobin: 12.8 g/dL (ref 12.0–15.0)
Lymphs Abs: 2.9 10*3/uL (ref 0.7–4.0)
MCH: 29.6 pg (ref 26.0–34.0)
MCV: 91.4 fL (ref 78.0–100.0)
Monocytes Relative: 9 % (ref 3–12)
RBC: 4.32 MIL/uL (ref 3.87–5.11)
RDW: 14.6 % (ref 11.5–15.5)

## 2013-01-28 LAB — COMPREHENSIVE METABOLIC PANEL
Albumin: 3.5 g/dL (ref 3.5–5.2)
Alkaline Phosphatase: 97 U/L (ref 39–117)
BUN: 15 mg/dL (ref 6–23)
Calcium: 9.4 mg/dL (ref 8.4–10.5)
Creatinine, Ser: 0.65 mg/dL (ref 0.50–1.10)
GFR calc Af Amer: >90 mL/min (ref >90)
GFR calc non Af Amer: 87 mL/min — ABNORMAL LOW (ref >90)
Glucose, Bld: 81 mg/dL (ref 70–99)
Potassium: 3.9 mEq/L (ref 3.5–5.1)
Total Protein: 6.4 g/dL (ref 6.0–8.3)

## 2013-01-28 MED ORDER — SODIUM CHLORIDE 0.9 % IJ SOLN
10.0000 mL | INTRAMUSCULAR | Status: DC | PRN
  Administered 2013-01-28: 10 mL

## 2013-01-28 MED ORDER — SODIUM CHLORIDE 0.9 % IV SOLN
Freq: Once | INTRAVENOUS | Status: AC
  Administered 2013-01-28: 8 mg via INTRAVENOUS
  Filled 2013-01-28: qty 4

## 2013-01-28 MED ORDER — SODIUM CHLORIDE 0.9 % IV SOLN
Freq: Once | INTRAVENOUS | Status: AC
  Administered 2013-01-28: 12:00:00 via INTRAVENOUS

## 2013-01-28 MED ORDER — SODIUM CHLORIDE 0.9 % IV SOLN
120.0000 mg/m2 | Freq: Once | INTRAVENOUS | Status: AC
  Administered 2013-01-28: 230 mg via INTRAVENOUS
  Filled 2013-01-28: qty 11.5

## 2013-01-28 MED ORDER — HEPARIN SOD (PORK) LOCK FLUSH 100 UNIT/ML IV SOLN
500.0000 [IU] | Freq: Once | INTRAVENOUS | Status: AC | PRN
  Administered 2013-01-28: 500 [IU]
  Filled 2013-01-28: qty 5

## 2013-01-29 ENCOUNTER — Encounter (HOSPITAL_BASED_OUTPATIENT_CLINIC_OR_DEPARTMENT_OTHER): Payer: Medicare Other

## 2013-01-29 ENCOUNTER — Encounter (HOSPITAL_COMMUNITY): Payer: Self-pay | Admitting: Oncology

## 2013-01-29 VITALS — BP 120/70 | HR 93 | Temp 97.7°F | Resp 18

## 2013-01-29 DIAGNOSIS — N9489 Other specified conditions associated with female genital organs and menstrual cycle: Secondary | ICD-10-CM

## 2013-01-29 DIAGNOSIS — C50919 Malignant neoplasm of unspecified site of unspecified female breast: Secondary | ICD-10-CM

## 2013-01-29 DIAGNOSIS — Z5111 Encounter for antineoplastic chemotherapy: Secondary | ICD-10-CM

## 2013-01-29 DIAGNOSIS — R109 Unspecified abdominal pain: Secondary | ICD-10-CM

## 2013-01-29 DIAGNOSIS — C569 Malignant neoplasm of unspecified ovary: Secondary | ICD-10-CM

## 2013-01-29 DIAGNOSIS — I10 Essential (primary) hypertension: Secondary | ICD-10-CM

## 2013-01-29 DIAGNOSIS — C786 Secondary malignant neoplasm of retroperitoneum and peritoneum: Secondary | ICD-10-CM

## 2013-01-29 DIAGNOSIS — IMO0002 Reserved for concepts with insufficient information to code with codable children: Secondary | ICD-10-CM

## 2013-01-29 DIAGNOSIS — C801 Malignant (primary) neoplasm, unspecified: Secondary | ICD-10-CM

## 2013-01-29 DIAGNOSIS — Z853 Personal history of malignant neoplasm of breast: Secondary | ICD-10-CM

## 2013-01-29 MED ORDER — SODIUM CHLORIDE 0.9 % IV SOLN
120.0000 mg/m2 | Freq: Once | INTRAVENOUS | Status: AC
  Administered 2013-01-29: 230 mg via INTRAVENOUS
  Filled 2013-01-29: qty 11.5

## 2013-01-29 MED ORDER — SODIUM CHLORIDE 0.9 % IV SOLN
Freq: Once | INTRAVENOUS | Status: AC
  Administered 2013-01-29: 10:00:00 via INTRAVENOUS

## 2013-01-29 MED ORDER — SODIUM CHLORIDE 0.9 % IV SOLN
Freq: Once | INTRAVENOUS | Status: AC
  Administered 2013-01-29: 8 mg via INTRAVENOUS
  Filled 2013-01-29: qty 4

## 2013-01-29 MED ORDER — HEPARIN SOD (PORK) LOCK FLUSH 100 UNIT/ML IV SOLN
500.0000 [IU] | Freq: Once | INTRAVENOUS | Status: AC | PRN
  Administered 2013-01-29: 500 [IU]
  Filled 2013-01-29: qty 5

## 2013-01-29 MED ORDER — SODIUM CHLORIDE 0.9 % IJ SOLN
10.0000 mL | INTRAMUSCULAR | Status: DC | PRN
  Administered 2013-01-29: 10 mL

## 2013-01-29 NOTE — Progress Notes (Signed)
Rita Pizza, MD  [address]  [place] [address]  Ovarian cancer, unspecified laterality  Omental metastasis  CURRENT THERAPY: S/P 3 cycles of Etoposide chemotherapy beginning on 12/17/2012 with Neulasta support.  INTERVAL HISTORY: Rita Humphrey 72 y.o. female returns for  regular  visit for followup of Stage IIIC Ovarian Cancer S/P 6 cycles of platinum-based chemotherapy  From 06/07/2012- 09/20/2012, followed by optimal debulking by Dr. Duard Brady.  Now on Etoposide chemotherapy planned for 4 cycles.     Ovarian cancer   05/15/2012 Initial Diagnosis IIIC ovarian cancer   06/05/2012 Surgery suboptimal debulking   06/07/2012 - 06/07/2012 Chemotherapy Carboplatin/Docetaxel as inpatient at [facility] by Dr. Cyndie Chime.  1 cycle.   06/28/2012 - 08/09/2012 Chemotherapy Carboplatin/Paclitaxel x 3 cycles   08/30/2012 - 09/20/2012 Chemotherapy Carboplatin/Abraxane x 2 cycles (due to increasing peripheral neuropathy)   10/30/2012 Surgery exploratory laparotomy, optimal debulking. [facility]   10/31/2012 Remission    12/17/2012 -  Chemotherapy Etoposide maintenance therapy.   She is nearing completion of Etoposide chemotherapy.  We discussed the future plan of therapy which is to administer 4 cycles of Etoposide, followed by restaging PET scan, followed by maintenance Avastin therapy.  I discussed the risks, benefits, alternatives, and side effects of Avastin therapy.     Her daughter requests an excuse to avoid jury duty which is absolutely appropriate as she is caring for her mother and providing transportation to appointments.  A letter was written today and given to daughter, [name].  Lamika's only complaint is that she is having difficulty sleeping at night.  She has tried benadryl, but the anticholinergic effect of this medication has dried her nares and causes bleeding is discomfort.  As a result, we will try Restoril 15 mg at HS.  She was educated on the side  effects of this medication.  We also discussed the risks, benefits, and alternatives.   Oncologically, she otherwise denies any complaints and ROS questioning is negative.   Past Medical History  Diagnosis Date  . Hypertension   . Hyperlipemia   . Thyroid disease   . Asthma   . Adnexal mass 05/16/2012  . 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 Cancer     [place]  . Breast cancer     s/p bilateral mastectomy [id number]  . Ovarian cancer 05/17/2012  . 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 Peripheral vascular disease 2013    hx of DVT right leg  . History of kidney stones   . Anemia   . Platelets decreased april 2014     1 transfusion given before chemo  . Deafness in left ear   . Numbness in feet   . Numbness of fingers of both hands   . Dizziness     occasional  . History of breast cancer 12/07/2012    has Hypokalemia; Abdominal pain; HTN (hypertension); Hyperlipemia; Low back pain; Neck pain; Ascites; Adnexal mass; Unspecified constipation; Thrombocytosis; Ovarian cancer; Omental metastasis; Nausea with vomiting; Peripheral neuropathy, secondary to chemotherapy; and History of breast cancer on her problem list.     is allergic to gabapentin; penicillins; carboplatin; and morphine and related.  Ms. Riche does not currently have medications on file.  Past Surgical History  Procedure Laterality Date  . Thyroid lobectomy    . Foot surgery      2 spurs taken out of foot  . Varicose vein surgery    .  Bladder tack surgery     . Tonsillectomy    . Mastectomy      bilateral   . Laparotomy Bilateral 06/05/2012    Procedure: EXPLORATORY LAPAROTOMY WITH PELVIC TUMOR BIOPSY;  Surgeon: Rejeana Brock A. Duard Brady, MD;  Location: [facility];  Service: Gynecology;  Laterality: Bilateral;  . Abdominal hysterectomy  1980  . Vascular surgery      veins stripped from both legs  . Portacath placement Left 06/25/2012    Procedure: INSERTION PORT-A-CATH;   Surgeon: Dalia Heading, MD;  Location: [facility];  Service: General;  Laterality: Left;  . Laparoscopy N/A 10/30/2012    Procedure: LAPAROSCOPY DIAGNOSTIC, EXPLORATORY LAPAROTOMY, BILATERAL SALPINGO-OOPHORECTOMY, TUMOR DEBULKING;  Surgeon: Rejeana Brock A. Duard Brady, MD;  Location: [facility];  Service: Gynecology;  Laterality: N/A;    Denies any headaches, dizziness, double vision, fevers, chills, night sweats, nausea, vomiting, diarrhea, constipation, chest pain, heart palpitations, shortness of breath, blood in stool, black tarry stool, urinary pain, urinary burning, urinary frequency, hematuria.   PHYSICAL EXAMINATION  ECOG PERFORMANCE STATUS: 1 - Symptomatic but completely ambulatory  There were no vitals filed for this visit.  GENERAL:alert, no distress, well nourished, well developed, comfortable, cooperative, obese and smiling SKIN: skin color, texture, turgor are normal, no rashes or significant lesions HEAD: Normocephalic, No masses, lesions, tenderness or abnormalities EYES: normal, PERRLA, EOMI, Conjunctiva are pink and non-injected EARS: External ears normal OROPHARYNX:mucous membranes are moist  NECK: supple, trachea midline LYMPH:  not examined BREAST:not examined LUNGS: clear to auscultation  HEART: regular rate & rhythm, no murmurs and no gallops ABDOMEN:abdomen soft, non-tender and normal bowel sounds BACK: Back symmetric, no curvature., No CVA tenderness EXTREMITIES:less then 2 second capillary refill, no joint deformities, effusion, or inflammation, no skin discoloration, no clubbing, no cyanosis  NEURO: alert & oriented x 3 with fluent speech, no focal motor/sensory deficits    LABORATORY DATA: CBC    Component Value Date/Time   WBC 9.4 01/28/2013 1044   RBC 4.32 01/28/2013 1044   HGB 12.8 01/28/2013 1044   HCT 39.5 01/28/2013 1044   PLT 338 01/28/2013 1044   MCV 91.4 01/28/2013 1044   MCH 29.6 01/28/2013 1044   MCHC 32.4 01/28/2013 1044   RDW 14.6 01/28/2013 1044    LYMPHSABS 2.9 01/28/2013 1044   MONOABS 0.9 01/28/2013 1044   EOSABS 0.1 01/28/2013 1044   BASOSABS 0.0 01/28/2013 1044      Chemistry      Component Value Date/Time   NA 139 01/28/2013 1044   K 3.9 01/28/2013 1044   CL 105 01/28/2013 1044   CO2 25 01/28/2013 1044   BUN 15 01/28/2013 1044   CREATININE 0.65 01/28/2013 1044      Component Value Date/Time   CALCIUM 9.4 01/28/2013 1044   ALKPHOS 97 01/28/2013 1044   AST 15 01/28/2013 1044   ALT 19 01/28/2013 1044   BILITOT 0.3 01/28/2013 1044     Lab Results  Component Value Date   CA125 15.2 12/05/2012      ASSESSMENT:  1. Stage IIIC Ovarian Cancer S/P 6 cycles of platinum-based chemotherapy  From 06/07/2012- 09/20/2012, followed by optimal debulking by Dr. Duard Brady.  Now on Etoposide chemotherapy with intentions of 4 cycles.  2. Peripheral neuropathy, improved on therapy with Cymbalta 3. History of allergic reaction to carboplatin, reason that the drug is not being utilized now 4. Insomnia  Patient Active Problem List   Diagnosis Date Noted  . History of breast cancer 12/07/2012  . Peripheral neuropathy, secondary  to chemotherapy 08/30/2012  . Nausea with vomiting 07/09/2012  . Thrombocytosis 05/17/2012  . Ovarian cancer 05/17/2012  . 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 Ascites 05/16/2012  . Adnexal mass 05/16/2012  . Unspecified constipation 05/16/2012  . Low back pain 05/15/2012  . Neck pain 05/15/2012  . Hypokalemia 05/14/2012  . Abdominal pain 05/14/2012  . HTN (hypertension) 05/14/2012  . Hyperlipemia     PLAN:  1. I personally reviewed and went over laboratory results with the patient. 2. Labs every 3 weeks: CBC diff, CMET, CA-125 3. She has completed 3 cycles of Etoposide chemotherapy. 4. Cycle 4 of Etoposide as scheduled.  5. PET scan restaging as scheduled.  6. If PET scan does not demonstrate disease, Gara will move on to have Avastin maintenance therapy.  7. Patient will require Avastin  hemotherapy-teaching.  Message sent to nurse navigator. 8. Rx for Restoril 15 mg capsule #30 with 1 refill. 9. Discussed the risks, benefits, alternatives, and side effects of Restoril. 10. Letter for daughter, [name], to be excused for [employer]. 11. Return as scheduled.    THERAPY PLAN:  The plan is to administer 4 cycles of Etoposide chemotherapy followed by a restaging PET scans.  If no evidence of disease is appreciated, will move on with Avastin-alone maintenance therapy.   All questions were answered. The patient knows to call the clinic with any problems, questions or concerns. We can certainly see the patient much sooner if necessary.  Patient and plan discussed with Dr. Alla German and he is in agreement with the aforementioned.   More than 50% of the time spent with the patient was utilized for counseling and coordination of care.   Sabria Florido

## 2013-01-30 ENCOUNTER — Encounter (HOSPITAL_COMMUNITY): Payer: Self-pay

## 2013-01-30 ENCOUNTER — Encounter (HOSPITAL_BASED_OUTPATIENT_CLINIC_OR_DEPARTMENT_OTHER): Payer: Medicare Other | Admitting: Oncology

## 2013-01-30 ENCOUNTER — Encounter (HOSPITAL_BASED_OUTPATIENT_CLINIC_OR_DEPARTMENT_OTHER): Payer: Medicare Other

## 2013-01-30 VITALS — BP 118/71 | HR 79 | Temp 97.6°F | Resp 18

## 2013-01-30 DIAGNOSIS — C786 Secondary malignant neoplasm of retroperitoneum and peritoneum: Secondary | ICD-10-CM

## 2013-01-30 DIAGNOSIS — C569 Malignant neoplasm of unspecified ovary: Secondary | ICD-10-CM

## 2013-01-30 DIAGNOSIS — Z5111 Encounter for antineoplastic chemotherapy: Secondary | ICD-10-CM

## 2013-01-30 DIAGNOSIS — R109 Unspecified abdominal pain: Secondary | ICD-10-CM

## 2013-01-30 DIAGNOSIS — G4700 Insomnia, unspecified: Secondary | ICD-10-CM

## 2013-01-30 DIAGNOSIS — C801 Malignant (primary) neoplasm, unspecified: Secondary | ICD-10-CM

## 2013-01-30 DIAGNOSIS — IMO0002 Reserved for concepts with insufficient information to code with codable children: Secondary | ICD-10-CM

## 2013-01-30 DIAGNOSIS — N9489 Other specified conditions associated with female genital organs and menstrual cycle: Secondary | ICD-10-CM

## 2013-01-30 DIAGNOSIS — I10 Essential (primary) hypertension: Secondary | ICD-10-CM

## 2013-01-30 DIAGNOSIS — C50919 Malignant neoplasm of unspecified site of unspecified female breast: Secondary | ICD-10-CM

## 2013-01-30 MED ORDER — SODIUM CHLORIDE 0.9 % IV SOLN
120.0000 mg/m2 | Freq: Once | INTRAVENOUS | Status: AC
  Administered 2013-01-30: 230 mg via INTRAVENOUS
  Filled 2013-01-30: qty 11.5

## 2013-01-30 MED ORDER — SODIUM CHLORIDE 0.9 % IV SOLN
Freq: Once | INTRAVENOUS | Status: AC
  Administered 2013-01-30: 8 mg via INTRAVENOUS
  Filled 2013-01-30: qty 4

## 2013-01-30 MED ORDER — TEMAZEPAM 15 MG PO CAPS: 15.0000 mg | ORAL_CAPSULE | Freq: Every evening | ORAL | Status: DC | PRN

## 2013-01-30 MED ORDER — SODIUM CHLORIDE 0.9 % IJ SOLN
10.0000 mL | INTRAMUSCULAR | Status: DC | PRN
  Administered 2013-01-30: 10 mL

## 2013-01-30 MED ORDER — HEPARIN SOD (PORK) LOCK FLUSH 100 UNIT/ML IV SOLN
500.0000 [IU] | Freq: Once | INTRAVENOUS | Status: AC | PRN
  Administered 2013-01-30: 500 [IU]
  Filled 2013-01-30: qty 5

## 2013-01-30 MED ORDER — SODIUM CHLORIDE 0.9 % IV SOLN
Freq: Once | INTRAVENOUS | Status: AC
  Administered 2013-01-30: 09:00:00 via INTRAVENOUS

## 2013-01-30 NOTE — Patient Instructions (Signed)
[facility] Discharge Instructions  RECOMMENDATIONS MADE BY THE CONSULTANT AND ANY TEST RESULTS WILL BE SENT TO YOUR REFERRING PHYSICIAN.  Continue with last cycle chemotherapy as scheduled. Scans as scheduled late December. MD appointment following scans. Tom discussed with you maintenance therapy of Avastin. We will discuss this again at follow up after scans. A prescription for Restoril was given to you for insomnia.  Thank you for choosing [facility] to provide your oncology and hematology care.  To afford each patient quality time with our providers, please arrive at least 15 minutes before your scheduled appointment time.  With your help, our goal is to use those 15 minutes to complete the necessary work-up to ensure our physicians have the information they need to help with your evaluation and healthcare recommendations.    Effective January 1st, 2014, we ask that you re-schedule your appointment with our physicians should you arrive 10 or more minutes late for your appointment.  We strive to give you quality time with our providers, and arriving late affects you and other patients whose appointments are after yours.    Again, thank you for choosing [facility].  Our hope is that these requests will decrease the amount of time that you wait before being seen by our physicians.       _____________________________________________________________  Should you have questions after your visit to [facility], please contact our office at [phone] between the hours of 8:30 a.m. and 5:00 p.m.  Voicemails left after 4:30 p.m. will not be returned until the following business day.  For prescription refill requests, have your pharmacy contact our office with your prescription refill request.

## 2013-01-31 ENCOUNTER — Encounter (HOSPITAL_BASED_OUTPATIENT_CLINIC_OR_DEPARTMENT_OTHER): Payer: Medicare Other

## 2013-01-31 VITALS — BP 126/82 | HR 79 | Temp 98.6°F | Resp 20

## 2013-01-31 DIAGNOSIS — Z5189 Encounter for other specified aftercare: Secondary | ICD-10-CM

## 2013-01-31 DIAGNOSIS — C50919 Malignant neoplasm of unspecified site of unspecified female breast: Secondary | ICD-10-CM

## 2013-01-31 DIAGNOSIS — IMO0002 Reserved for concepts with insufficient information to code with codable children: Secondary | ICD-10-CM

## 2013-01-31 DIAGNOSIS — C801 Malignant (primary) neoplasm, unspecified: Secondary | ICD-10-CM

## 2013-01-31 DIAGNOSIS — C569 Malignant neoplasm of unspecified ovary: Secondary | ICD-10-CM

## 2013-01-31 DIAGNOSIS — R109 Unspecified abdominal pain: Secondary | ICD-10-CM

## 2013-01-31 DIAGNOSIS — C786 Secondary malignant neoplasm of retroperitoneum and peritoneum: Secondary | ICD-10-CM

## 2013-01-31 DIAGNOSIS — N9489 Other specified conditions associated with female genital organs and menstrual cycle: Secondary | ICD-10-CM

## 2013-01-31 DIAGNOSIS — I10 Essential (primary) hypertension: Secondary | ICD-10-CM

## 2013-01-31 MED ORDER — PEGFILGRASTIM INJECTION 6 MG/0.6ML
SUBCUTANEOUS | Status: AC
  Filled 2013-01-31: qty 0.6

## 2013-01-31 MED ORDER — PEGFILGRASTIM INJECTION 6 MG/0.6ML
6.0000 mg | Freq: Once | SUBCUTANEOUS | Status: AC
  Administered 2013-01-31: 6 mg via SUBCUTANEOUS

## 2013-01-31 NOTE — Progress Notes (Signed)
Rita Humphrey presents today for injection per MD orders. Neulasta 6mg administered SQ in right Abdomen. Administration without incident. Patient tolerated well.  

## 2013-02-14 ENCOUNTER — Telehealth (HOSPITAL_COMMUNITY): Payer: Self-pay | Admitting: Hematology and Oncology

## 2013-02-14 NOTE — Telephone Encounter (Signed)
AUTH NOT REQUIRED FOR NEULASTA AND ETOPSIDE

## 2013-02-18 ENCOUNTER — Encounter (HOSPITAL_COMMUNITY): Payer: Medicare Other | Attending: Oncology

## 2013-02-18 VITALS — BP 109/64 | HR 87 | Temp 98.3°F | Resp 20 | Wt 194.0 lb

## 2013-02-18 DIAGNOSIS — G622 Polyneuropathy due to other toxic agents: Secondary | ICD-10-CM | POA: Insufficient documentation

## 2013-02-18 DIAGNOSIS — R109 Unspecified abdominal pain: Secondary | ICD-10-CM | POA: Insufficient documentation

## 2013-02-18 DIAGNOSIS — C569 Malignant neoplasm of unspecified ovary: Secondary | ICD-10-CM | POA: Insufficient documentation

## 2013-02-18 DIAGNOSIS — IMO0002 Reserved for concepts with insufficient information to code with codable children: Secondary | ICD-10-CM

## 2013-02-18 DIAGNOSIS — N9489 Other specified conditions associated with female genital organs and menstrual cycle: Secondary | ICD-10-CM | POA: Insufficient documentation

## 2013-02-18 DIAGNOSIS — C786 Secondary malignant neoplasm of retroperitoneum and peritoneum: Secondary | ICD-10-CM | POA: Insufficient documentation

## 2013-02-18 DIAGNOSIS — I10 Essential (primary) hypertension: Secondary | ICD-10-CM | POA: Insufficient documentation

## 2013-02-18 DIAGNOSIS — Z5111 Encounter for antineoplastic chemotherapy: Secondary | ICD-10-CM

## 2013-02-18 LAB — CBC WITH DIFFERENTIAL/PLATELET
Basophils Absolute: 0 10*3/uL (ref 0.0–0.1)
Basophils Relative: 0 % (ref 0–1)
Eosinophils Relative: 1 % (ref 0–5)
HCT: 38.3 % (ref 36.0–46.0)
Lymphocytes Relative: 24 % (ref 12–46)
MCV: 91.4 fL (ref 78.0–100.0)
Monocytes Absolute: 0.8 10*3/uL (ref 0.1–1.0)
Neutrophils Relative %: 67 % (ref 43–77)
Platelets: 303 10*3/uL (ref 150–400)
RBC: 4.19 MIL/uL (ref 3.87–5.11)
RDW: 15.9 % — ABNORMAL HIGH (ref 11.5–15.5)
WBC: 10.4 10*3/uL (ref 4.0–10.5)

## 2013-02-18 LAB — COMPREHENSIVE METABOLIC PANEL
ALT: 18 U/L (ref 0–35)
AST: 14 U/L (ref 0–37)
Albumin: 3.5 g/dL (ref 3.5–5.2)
CO2: 25 mEq/L (ref 19–32)
Calcium: 9.7 mg/dL (ref 8.4–10.5)
Creatinine, Ser: 0.77 mg/dL (ref 0.50–1.10)
GFR calc non Af Amer: 82 mL/min — ABNORMAL LOW (ref >90)
Glucose, Bld: 101 mg/dL — ABNORMAL HIGH (ref 70–99)
Sodium: 140 mEq/L (ref 135–145)
Total Protein: 6.8 g/dL (ref 6.0–8.3)

## 2013-02-18 LAB — CA 125: CA 125: 13.7 U/mL (ref 0.0–30.2)

## 2013-02-18 MED ORDER — SODIUM CHLORIDE 0.9 % IV SOLN
Freq: Once | INTRAVENOUS | Status: AC
  Administered 2013-02-18: 8 mg via INTRAVENOUS
  Filled 2013-02-18: qty 4

## 2013-02-18 MED ORDER — SODIUM CHLORIDE 0.9 % IV SOLN
Freq: Once | INTRAVENOUS | Status: AC
  Administered 2013-02-18: 10:00:00 via INTRAVENOUS

## 2013-02-18 MED ORDER — HEPARIN SOD (PORK) LOCK FLUSH 100 UNIT/ML IV SOLN
500.0000 [IU] | Freq: Once | INTRAVENOUS | Status: AC | PRN
  Administered 2013-02-18: 500 [IU]
  Filled 2013-02-18 (×2): qty 5

## 2013-02-18 MED ORDER — SODIUM CHLORIDE 0.9 % IV SOLN
120.0000 mg/m2 | Freq: Once | INTRAVENOUS | Status: AC
  Administered 2013-02-18: 230 mg via INTRAVENOUS
  Filled 2013-02-18: qty 11.5

## 2013-02-18 MED ORDER — SODIUM CHLORIDE 0.9 % IJ SOLN
10.0000 mL | INTRAMUSCULAR | Status: DC | PRN
  Administered 2013-02-18: 10 mL

## 2013-02-18 NOTE — Progress Notes (Signed)
Tolerated treatment well.  A&ox4; in no distress.  Left in c/o daughter for transport home.

## 2013-02-19 ENCOUNTER — Encounter (HOSPITAL_BASED_OUTPATIENT_CLINIC_OR_DEPARTMENT_OTHER): Payer: Medicare Other

## 2013-02-19 VITALS — BP 131/79 | HR 81 | Temp 98.5°F | Resp 18 | Wt 194.0 lb

## 2013-02-19 DIAGNOSIS — IMO0002 Reserved for concepts with insufficient information to code with codable children: Secondary | ICD-10-CM

## 2013-02-19 DIAGNOSIS — C569 Malignant neoplasm of unspecified ovary: Secondary | ICD-10-CM

## 2013-02-19 DIAGNOSIS — N9489 Other specified conditions associated with female genital organs and menstrual cycle: Secondary | ICD-10-CM

## 2013-02-19 DIAGNOSIS — I10 Essential (primary) hypertension: Secondary | ICD-10-CM

## 2013-02-19 DIAGNOSIS — Z5111 Encounter for antineoplastic chemotherapy: Secondary | ICD-10-CM

## 2013-02-19 DIAGNOSIS — R109 Unspecified abdominal pain: Secondary | ICD-10-CM

## 2013-02-19 DIAGNOSIS — C786 Secondary malignant neoplasm of retroperitoneum and peritoneum: Secondary | ICD-10-CM

## 2013-02-19 MED ORDER — SODIUM CHLORIDE 0.9 % IV SOLN
Freq: Once | INTRAVENOUS | Status: AC
  Administered 2013-02-19: 10:00:00 via INTRAVENOUS

## 2013-02-19 MED ORDER — SODIUM CHLORIDE 0.9 % IV SOLN
120.0000 mg/m2 | Freq: Once | INTRAVENOUS | Status: AC
  Administered 2013-02-19: 230 mg via INTRAVENOUS
  Filled 2013-02-19: qty 11.5

## 2013-02-19 MED ORDER — SODIUM CHLORIDE 0.9 % IJ SOLN: 10.0000 mL | INTRAMUSCULAR | Status: DC | PRN

## 2013-02-19 MED ORDER — HEPARIN SOD (PORK) LOCK FLUSH 100 UNIT/ML IV SOLN
500.0000 [IU] | Freq: Once | INTRAVENOUS | Status: AC | PRN
  Administered 2013-02-19: 500 [IU]
  Filled 2013-02-19: qty 5

## 2013-02-19 MED ORDER — SODIUM CHLORIDE 0.9 % IV SOLN
Freq: Once | INTRAVENOUS | Status: AC
  Administered 2013-02-19: 8 mg via INTRAVENOUS
  Filled 2013-02-19: qty 4

## 2013-02-19 NOTE — Progress Notes (Signed)
Tolerated infusion well. 

## 2013-02-20 ENCOUNTER — Encounter (HOSPITAL_BASED_OUTPATIENT_CLINIC_OR_DEPARTMENT_OTHER): Payer: Medicare Other

## 2013-02-20 VITALS — BP 131/74 | HR 83 | Temp 98.1°F | Resp 18 | Wt 196.4 lb

## 2013-02-20 DIAGNOSIS — C569 Malignant neoplasm of unspecified ovary: Secondary | ICD-10-CM

## 2013-02-20 DIAGNOSIS — IMO0002 Reserved for concepts with insufficient information to code with codable children: Secondary | ICD-10-CM

## 2013-02-20 DIAGNOSIS — R109 Unspecified abdominal pain: Secondary | ICD-10-CM

## 2013-02-20 DIAGNOSIS — Z5111 Encounter for antineoplastic chemotherapy: Secondary | ICD-10-CM

## 2013-02-20 DIAGNOSIS — N9489 Other specified conditions associated with female genital organs and menstrual cycle: Secondary | ICD-10-CM

## 2013-02-20 DIAGNOSIS — I10 Essential (primary) hypertension: Secondary | ICD-10-CM

## 2013-02-20 DIAGNOSIS — C786 Secondary malignant neoplasm of retroperitoneum and peritoneum: Secondary | ICD-10-CM

## 2013-02-20 MED ORDER — SODIUM CHLORIDE 0.9 % IV SOLN
Freq: Once | INTRAVENOUS | Status: AC
  Administered 2013-02-20: 8 mg via INTRAVENOUS
  Filled 2013-02-20: qty 4

## 2013-02-20 MED ORDER — HEPARIN SOD (PORK) LOCK FLUSH 100 UNIT/ML IV SOLN
500.0000 [IU] | Freq: Once | INTRAVENOUS | Status: AC | PRN
  Administered 2013-02-20: 500 [IU]

## 2013-02-20 MED ORDER — HEPARIN SOD (PORK) LOCK FLUSH 100 UNIT/ML IV SOLN
INTRAVENOUS | Status: AC
  Filled 2013-02-20: qty 5

## 2013-02-20 MED ORDER — SODIUM CHLORIDE 0.9 % IV SOLN
Freq: Once | INTRAVENOUS | Status: AC
  Administered 2013-02-20: 10:00:00 via INTRAVENOUS

## 2013-02-20 MED ORDER — SODIUM CHLORIDE 0.9 % IJ SOLN
10.0000 mL | INTRAMUSCULAR | Status: DC | PRN
  Administered 2013-02-20: 10 mL

## 2013-02-20 MED ORDER — SODIUM CHLORIDE 0.9 % IV SOLN
120.0000 mg/m2 | Freq: Once | INTRAVENOUS | Status: AC
  Administered 2013-02-20: 230 mg via INTRAVENOUS
  Filled 2013-02-20: qty 11.5

## 2013-02-20 NOTE — Progress Notes (Signed)
Tolerated chemo infusion well.  [employer].  To return to clinic tomorrow for Neulasta injection.

## 2013-02-21 ENCOUNTER — Encounter (HOSPITAL_BASED_OUTPATIENT_CLINIC_OR_DEPARTMENT_OTHER): Payer: Medicare Other

## 2013-02-21 VITALS — BP 131/59 | HR 88 | Temp 97.9°F | Resp 18

## 2013-02-21 DIAGNOSIS — I10 Essential (primary) hypertension: Secondary | ICD-10-CM

## 2013-02-21 DIAGNOSIS — Z5189 Encounter for other specified aftercare: Secondary | ICD-10-CM

## 2013-02-21 DIAGNOSIS — R109 Unspecified abdominal pain: Secondary | ICD-10-CM

## 2013-02-21 DIAGNOSIS — IMO0002 Reserved for concepts with insufficient information to code with codable children: Secondary | ICD-10-CM

## 2013-02-21 DIAGNOSIS — C569 Malignant neoplasm of unspecified ovary: Secondary | ICD-10-CM

## 2013-02-21 DIAGNOSIS — C786 Secondary malignant neoplasm of retroperitoneum and peritoneum: Secondary | ICD-10-CM

## 2013-02-21 DIAGNOSIS — N9489 Other specified conditions associated with female genital organs and menstrual cycle: Secondary | ICD-10-CM

## 2013-02-21 MED ORDER — PEGFILGRASTIM INJECTION 6 MG/0.6ML
SUBCUTANEOUS | Status: AC
  Filled 2013-02-21: qty 0.6

## 2013-02-21 MED ORDER — PEGFILGRASTIM INJECTION 6 MG/0.6ML
6.0000 mg | Freq: Once | SUBCUTANEOUS | Status: AC
  Administered 2013-02-21: 6 mg via SUBCUTANEOUS

## 2013-02-21 NOTE — Progress Notes (Signed)
Shelita M Gover presents today for injection per MD orders. Neulasta 6mg administered SQ in right Abdomen. Administration without incident. Patient tolerated well.  

## 2013-03-05 ENCOUNTER — Encounter (HOSPITAL_COMMUNITY)
Admission: RE | Admit: 2013-03-05 | Discharge: 2013-03-05 | Disposition: A | Discharge status: Home / Self Care | Payer: Medicare Other | Source: Ambulatory Visit | Attending: Hematology and Oncology | Admitting: Hematology and Oncology

## 2013-03-05 ENCOUNTER — Other Ambulatory Visit (HOSPITAL_COMMUNITY): Payer: Self-pay | Admitting: Hematology and Oncology

## 2013-03-05 DIAGNOSIS — D3000 Benign neoplasm of unspecified kidney: Secondary | ICD-10-CM | POA: Insufficient documentation

## 2013-03-05 DIAGNOSIS — K5730 Diverticulosis of large intestine without perforation or abscess without bleeding: Secondary | ICD-10-CM | POA: Insufficient documentation

## 2013-03-05 DIAGNOSIS — C569 Malignant neoplasm of unspecified ovary: Secondary | ICD-10-CM | POA: Insufficient documentation

## 2013-03-05 DIAGNOSIS — N281 Cyst of kidney, acquired: Secondary | ICD-10-CM | POA: Insufficient documentation

## 2013-03-05 LAB — GLUCOSE, CAPILLARY: Glucose-Capillary: 104 mg/dL — ABNORMAL HIGH (ref 70–99)

## 2013-03-05 MED ORDER — FLUDEOXYGLUCOSE F - 18 (FDG) INJECTION
18.5000 | Freq: Once | INTRAVENOUS | Status: AC | PRN
  Administered 2013-03-05: 18.5 via INTRAVENOUS

## 2013-03-06 ENCOUNTER — Encounter (HOSPITAL_BASED_OUTPATIENT_CLINIC_OR_DEPARTMENT_OTHER): Payer: Medicare Other

## 2013-03-06 ENCOUNTER — Encounter (HOSPITAL_COMMUNITY): Payer: Self-pay

## 2013-03-06 VITALS — BP 105/71 | HR 101 | Temp 98.0°F | Resp 18 | Wt 196.6 lb

## 2013-03-06 DIAGNOSIS — C50919 Malignant neoplasm of unspecified site of unspecified female breast: Secondary | ICD-10-CM

## 2013-03-06 DIAGNOSIS — IMO0002 Reserved for concepts with insufficient information to code with codable children: Secondary | ICD-10-CM

## 2013-03-06 DIAGNOSIS — C569 Malignant neoplasm of unspecified ovary: Secondary | ICD-10-CM

## 2013-03-06 DIAGNOSIS — Z853 Personal history of malignant neoplasm of breast: Secondary | ICD-10-CM

## 2013-03-06 DIAGNOSIS — G622 Polyneuropathy due to other toxic agents: Secondary | ICD-10-CM

## 2013-03-06 DIAGNOSIS — C786 Secondary malignant neoplasm of retroperitoneum and peritoneum: Secondary | ICD-10-CM

## 2013-03-06 MED ORDER — DULOXETINE HCL 30 MG PO CPEP: ORAL_CAPSULE | ORAL | Status: DC

## 2013-03-06 NOTE — Patient Instructions (Signed)
[facility] Discharge Instructions  RECOMMENDATIONS MADE BY THE CONSULTANT AND ANY TEST RESULTS WILL BE SENT TO YOUR REFERRING PHYSICIAN.  EXAM FINDINGS BY THE PHYSICIAN TODAY AND SIGNS OR SYMPTOMS TO REPORT TO CLINIC OR PRIMARY PHYSICIAN: Exam and findings as discussed by Dr. Zigmund Daniel.  Your scan was great.  No evidence of disease at this time.  Will do maintenance with Avastin every 3 weeks.  MEDICATIONS PRESCRIBED:  Increase cymbalta to 60 mg daily  INSTRUCTIONS/FOLLOW-UP: Follow-up 03/19/13 with chemotherapy and MD visit.  Thank you for choosing [facility] to provide your oncology and hematology care.  To afford each patient quality time with our providers, please arrive at least 15 minutes before your scheduled appointment time.  With your help, our goal is to use those 15 minutes to complete the necessary work-up to ensure our physicians have the information they need to help with your evaluation and healthcare recommendations.    Effective January 1st, 2014, we ask that you re-schedule your appointment with our physicians should you arrive 10 or more minutes late for your appointment.  We strive to give you quality time with our providers, and arriving late affects you and other patients whose appointments are after yours.    Again, thank you for choosing [facility].  Our hope is that these requests will decrease the amount of time that you wait before being seen by our physicians.       _____________________________________________________________  Should you have questions after your visit to [facility], please contact our office at [phone] between the hours of 8:30 a.m. and 5:00 p.m.  Voicemails left after 4:30 p.m. will not be returned until the following business day.  For prescription refill requests, have your pharmacy contact our office with your prescription refill request.

## 2013-03-06 NOTE — Progress Notes (Signed)
[facility]  OFFICE PROGRESS NOTE  [place], [facility], [place]  [address] [place] [address]  DIAGNOSIS: No diagnosis found.  Chief Complaint  Patient presents with  . Peritoneal carcinomatosis    CURRENT THERAPY: Etoposide for 3 days every 3-4 weeks for 4 cycles, last cycle started on 02/18/2013. Patient was treated neo-adjuvantly with carboplatin and Taxol for 6 cycles followed by exploratory laparoscopy at which time no disease was found. She subsequently received etoposide because of allergy to carboplatin and due to the development of severe neuropathy. She's also undergone bilateral mastectomies for breast cancer in 1993 and 2003.  INTERVAL HISTORY: Rita Humphrey 72 y.o. female returns for followup after receiving 4 cycles of etoposide chemotherapy following surgical exploration at which time no disease was found after having received 6 cycles of carboplatin and Taxol neoadjuvantly.  Neuropathic discomfort has worsened. She denies abdominal pain and has had excellent appetite. She denies any PND, orthopnea, palpitations, headache, but has lost all her hair. She denies any lower extremity swelling or redness, skin rash, cough, wheezing, sore throat, epistaxis, mouth is his, melena, hematochezia, hematuria, or vaginal bleeding.  MEDICAL HISTORY: Past Medical History  Diagnosis Date  . Hypertension   . Hyperlipemia   . Thyroid disease   . Asthma   . Adnexal mass 05/16/2012  . 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 Cancer     [place]  . Breast cancer     s/p bilateral mastectomy [id number]  . Ovarian cancer 05/17/2012  . 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 Peripheral vascular disease 2013    hx of DVT right leg  . History of kidney stones   . Anemia   . Platelets decreased april 2014     1 transfusion given before chemo  . Deafness in left ear   . Numbness in feet   . Numbness of  fingers of both hands   . Dizziness     occasional  . History of breast cancer 12/07/2012    INTERIM HISTORY: has Hypokalemia; Abdominal pain; HTN (hypertension); Hyperlipemia; Low back pain; Neck pain; Ascites; Adnexal mass; Unspecified constipation; Thrombocytosis; Ovarian cancer; Omental metastasis; Nausea with vomiting; Peripheral neuropathy, secondary to chemotherapy; and History of breast cancer on her problem list.   Stage IIIC Ovarian Cancer S/P 6 cycles of platinum-based chemotherapy From 06/07/2012- 09/20/2012, followed by optimal debulking by Dr. Duard Brady.  Completed 4 cycles of etoposide therapy with last cycle started on 02/18/2013 and is here today to discuss results of PET/CT scan.   ALLERGIES:  is allergic to gabapentin; penicillins; carboplatin; and morphine and related.  MEDICATIONS: has a current medication list which includes the following prescription(s): amlodipine, vitamin d3, dexamethasone, duloxetine, etoposide, lidocaine-prilocaine, ondansetron, oxycodone-acetaminophen, pegfilgrastim, potassium chloride sa, prochlorperazine, sulfamethoxazole-trimethoprim, temazepam, tetrahydroz-dextran-peg-povid, and vitamin b-6.  SURGICAL HISTORY:  Past Surgical History  Procedure Laterality Date  . Thyroid lobectomy    . Foot surgery      2 spurs taken out of foot  . Varicose vein surgery    . Bladder tack surgery     . Tonsillectomy    . Mastectomy      bilateral   . Laparotomy Bilateral 06/05/2012    Procedure: EXPLORATORY LAPAROTOMY WITH PELVIC TUMOR BIOPSY;  Surgeon: Rejeana Brock A. Duard Brady, MD;  Location: [facility];  Service: Gynecology;  Laterality: Bilateral;  . Abdominal hysterectomy  1980  . Vascular  surgery      veins stripped from both legs  . Portacath placement Left 06/25/2012    Procedure: INSERTION PORT-A-CATH;  Surgeon: Dalia Heading, MD;  Location: [facility];  Service: General;  Laterality: Left;  . Laparoscopy N/A 10/30/2012    Procedure: LAPAROSCOPY DIAGNOSTIC, EXPLORATORY  LAPAROTOMY, BILATERAL SALPINGO-OOPHORECTOMY, TUMOR DEBULKING;  Surgeon: Rejeana Brock A. Duard Brady, MD;  Location: [facility];  Service: Gynecology;  Laterality: N/A;    FAMILY HISTORY: family history includes Cancer in her mother; Heart attack in her brother; Hypertension in her father and sister.  SOCIAL HISTORY:  reports that she has never smoked. She has never used smokeless tobacco. She reports that she does not drink alcohol or use illicit drugs.  REVIEW OF SYSTEMS:  Other than that discussed above is noncontributory.  PHYSICAL EXAMINATION: ECOG PERFORMANCE STATUS: 1 - Symptomatic but completely ambulatory  There were no vitals taken for this visit.  GENERAL:alert, no distress and comfortable SKIN: skin color, texture, turgor are normal, no rashes or significant lesions EYES: PERLA; Conjunctiva are pink and non-injected, sclera clear OROPHARYNX:no exudate, no erythema on lips, buccal mucosa, or tongue. NECK: supple, thyroid normal size, non-tender, without nodularity. No masses CHEST: Status post bilateral mastectomies with no subcutaneous nodules. LYMPH:  no palpable lymphadenopathy in the cervical, axillary or inguinal LUNGS: clear to auscultation and percussion with normal breathing effort HEART: regular rate & rhythm and no murmurs. ABDOMEN:abdomen soft, non-tender and normal bowel sounds. No masses or free fluid wave. MUSCULOSKELETAL:no cyanosis of digits and no clubbing. Range of motion normal.  NEURO: alert & oriented x 3 with fluent speech, no focal motor/sensory deficits. DTRs diminished.   LABORATORY DATA: Hospital Outpatient Visit on 03/05/2013  Component Date Value Range Status  . Glucose-Capillary 03/05/2013 104* 70 - 99 mg/dL Final  Infusion on 16/12/9602  Component Date Value Range Status  . WBC 02/18/2013 10.4  4.0 - 10.5 K/uL Final  . RBC 02/18/2013 4.19  3.87 - 5.11 MIL/uL Final  . Hemoglobin 02/18/2013 12.6  12.0 - 15.0 g/dL Final  . HCT 54/11/8117 38.3  36.0 - 46.0 %  Final  . MCV 02/18/2013 91.4  78.0 - 100.0 fL Final  . MCH 02/18/2013 30.1  26.0 - 34.0 pg Final  . MCHC 02/18/2013 32.9  30.0 - 36.0 g/dL Final  . RDW 14/78/2956 15.9* 11.5 - 15.5 % Final  . Platelets 02/18/2013 303  150 - 400 K/uL Final  . Neutrophils Relative % 02/18/2013 67  43 - 77 % Final  . Neutro Abs 02/18/2013 7.0  1.7 - 7.7 K/uL Final  . Lymphocytes Relative 02/18/2013 24  12 - 46 % Final  . Lymphs Abs 02/18/2013 2.5  0.7 - 4.0 K/uL Final  . Monocytes Relative 02/18/2013 8  3 - 12 % Final  . Monocytes Absolute 02/18/2013 0.8  0.1 - 1.0 K/uL Final  . Eosinophils Relative 02/18/2013 1  0 - 5 % Final  . Eosinophils Absolute 02/18/2013 0.1  0.0 - 0.7 K/uL Final  . Basophils Relative 02/18/2013 0  0 - 1 % Final  . Basophils Absolute 02/18/2013 0.0  0.0 - 0.1 K/uL Final  . Sodium 02/18/2013 140  135 - 145 mEq/L Final  . Potassium 02/18/2013 3.8  3.5 - 5.1 mEq/L Final  . Chloride 02/18/2013 105  96 - 112 mEq/L Final  . CO2 02/18/2013 25  19 - 32 mEq/L Final  . Glucose, Bld 02/18/2013 101* 70 - 99 mg/dL Final  . BUN 21/30/8657 22  6 -  23 mg/dL Final  . Creatinine, Ser 02/18/2013 0.77  0.50 - 1.10 mg/dL Final  . Calcium 46/96/2952 9.7  8.4 - 10.5 mg/dL Final  . Total Protein 02/18/2013 6.8  6.0 - 8.3 g/dL Final  . Albumin 84/13/2440 3.5  3.5 - 5.2 g/dL Final  . AST 01/01/2535 14  0 - 37 U/L Final  . ALT 02/18/2013 18  0 - 35 U/L Final  . Alkaline Phosphatase 02/18/2013 109  39 - 117 U/L Final  . Total Bilirubin 02/18/2013 0.3  0.3 - 1.2 mg/dL Final  . GFR calc non Af Amer 02/18/2013 82* >90 mL/min Final  . GFR calc Af Amer 02/18/2013 >90  >90 mL/min Final   Comment: (NOTE)                          The eGFR has been calculated using the CKD EPI equation.                          This calculation has not been validated in all clinical situations.                          eGFR's persistently <90 mL/min signify possible Chronic Kidney                          Disease.  . CA 125  02/18/2013 13.7  0.0 - 30.2 U/mL Final   Performed at [facility]    PATHOLOGY: No new pathology.  Urinalysis    Component Value Date/Time   COLORURINE YELLOW 07/03/2012 1158   APPEARANCEUR CLEAR 07/03/2012 1158   LABSPEC 1.020 07/03/2012 1158   PHURINE 5.5 07/03/2012 1158   GLUCOSEU 100* 07/03/2012 1158   HGBUR NEGATIVE 07/03/2012 1158   BILIRUBINUR SMALL* 07/03/2012 1158   KETONESUR NEGATIVE 07/03/2012 1158   PROTEINUR 100* 07/03/2012 1158   UROBILINOGEN 4.0* 07/03/2012 1158   NITRITE POSITIVE* 07/03/2012 1158   LEUKOCYTESUR TRACE* 07/03/2012 1158    RADIOGRAPHIC STUDIES: Nm Pet Image Initial (pi) Skull Base To Thigh  03/05/2013   CLINICAL DATA:  Subsequent treatment strategy for stage IIIC ovarian cancer. Patient underwent debulking on 06/05/2012. Repeat surgery on 10/30/2012.  EXAM: NUCLEAR MEDICINE PET SKULL BASE TO THIGH  FASTING BLOOD GLUCOSE:  Value: 104mg /dl  TECHNIQUE: 64.4 mCi I-34 FDG was injected intravenously. CT data was obtained and used for attenuation correction and anatomic localization only. (This was not acquired as a diagnostic CT examination.) Additional exam technical data entered on technologist worksheet.  COMPARISON:  CT abdomen and pelvis from 10/01/2012.  FINDINGS: NECK  No hypermetabolic lymph nodes in the neck.  CHEST  No hypermetabolic mediastinal or hilar nodes. No suspicious pulmonary nodules on the CT scan.  The left-sided Port-A-Cath tip is in the left innominate vein, just proximal to the innominate vein confluence. .  ABDOMEN/PELVIS  No abnormal hypermetabolic activity within the liver, pancreas, adrenal glands, or spleen. No hypermetabolic lymph nodes in the abdomen or pelvis. 13 x 7 mm nodule in the gastrohepatic ligament was 18 x 7 mm previously. This shows no hypermetabolism on the PET images.  The omental nodularity seen previously is no longer evident. No abnormal FDG accumulation within the abdomen on today's PET images. Tiny soft tissue nodules  just caudal to the lateral segment left liver are stable and show no hypermetabolism on the PET images.  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  SKELETON  No focal hypermetabolic activity to suggest skeletal metastasis. There is low level diffuse and relatively homogeneous FDG uptake within the marrow space, likely related to treatment mediated marrow stimulation.  IMPRESSION: No concerning focal areas of hypermetabolism in the neck, chest, abdomen, or pelvis on today's study. The abnormal soft tissue identified within the omentum on the previous study is no longer evident after surgery. Small lymph nodes or nodular areas of soft tissue in the gastrohepatic ligament and just inferior to the left liver are unchanged and show no hypermetabolism on today's PET images.  The left-sided Port-A-Cath tip is in the left innominate vein.   Electronically Signed   By: Kennith Center M.D.   On: 03/05/2013 10:32    ASSESSMENT:  #1. Ovarian cancer, stage III, status post neoadjuvant chemotherapy followed by debulking laparoscopic surgery with optimal results, status post 4 cycles of etoposide last given on 02/18/2013, with most recent PET scan showing no evidence of disease. #2. Worsening peripheral neuropathy. #3. History of allergic reaction to carboplatin, reason that it was not given after surgery since the disease was still considered platinum sensitive.   PLAN:  #1. Plan maintenance therapy to increase progression free survival using Avastin every 3 months to begin on 03/18/2013. #2. Prescription is given for breast prostheses. #3. Increase to  Cymbalta 60 mg at bedtime. #4. Followup on 03/18/2013.   All questions were answered. The patient knows to call the clinic with any problems, questions or concerns. We can certainly see the  patient much sooner if necessary.   I spent 25 minutes counseling the patient face to face. The total time spent in the appointment was 30 minutes.    Maurilio Lovely, MD 03/06/2013 10:35 AM

## 2013-03-08 NOTE — Progress Notes (Signed)
Chemo teaching done for Avastin and consent obtained on 03/06/13.

## 2013-03-19 ENCOUNTER — Encounter (HOSPITAL_BASED_OUTPATIENT_CLINIC_OR_DEPARTMENT_OTHER): Payer: Medicare HMO

## 2013-03-19 ENCOUNTER — Encounter (HOSPITAL_COMMUNITY): Payer: Self-pay

## 2013-03-19 ENCOUNTER — Encounter (HOSPITAL_COMMUNITY): Payer: Medicare HMO | Attending: Oncology

## 2013-03-19 VITALS — BP 142/82 | HR 102 | Temp 97.5°F | Resp 20 | Wt 200.0 lb

## 2013-03-19 DIAGNOSIS — I10 Essential (primary) hypertension: Secondary | ICD-10-CM

## 2013-03-19 DIAGNOSIS — IMO0002 Reserved for concepts with insufficient information to code with codable children: Secondary | ICD-10-CM

## 2013-03-19 DIAGNOSIS — C569 Malignant neoplasm of unspecified ovary: Secondary | ICD-10-CM

## 2013-03-19 DIAGNOSIS — G620 Drug-induced polyneuropathy: Secondary | ICD-10-CM

## 2013-03-19 DIAGNOSIS — Z5112 Encounter for antineoplastic immunotherapy: Secondary | ICD-10-CM

## 2013-03-19 LAB — URINALYSIS, DIPSTICK ONLY
BILIRUBIN URINE: NEGATIVE
Glucose, UA: NEGATIVE mg/dL
Hgb urine dipstick: NEGATIVE
Ketones, ur: NEGATIVE mg/dL
NITRITE: NEGATIVE
Protein, ur: NEGATIVE mg/dL
Specific Gravity, Urine: 1.015 (ref 1.005–1.030)
Urobilinogen, UA: 0.2 mg/dL (ref 0.0–1.0)
pH: 6.5 (ref 5.0–8.0)

## 2013-03-19 LAB — COMPREHENSIVE METABOLIC PANEL
ALT: 18 U/L (ref 0–35)
AST: 16 U/L (ref 0–37)
Albumin: 3.6 g/dL (ref 3.5–5.2)
Alkaline Phosphatase: 110 U/L (ref 39–117)
BILIRUBIN TOTAL: 0.3 mg/dL (ref 0.3–1.2)
BUN: 19 mg/dL (ref 6–23)
CHLORIDE: 104 meq/L (ref 96–112)
CO2: 27 meq/L (ref 19–32)
CREATININE: 0.77 mg/dL (ref 0.50–1.10)
Calcium: 9.7 mg/dL (ref 8.4–10.5)
GFR calc Af Amer: >90 mL/min (ref >90)
GFR, EST NON AFRICAN AMERICAN: 82 mL/min — AB (ref >90)
Glucose, Bld: 98 mg/dL (ref 70–99)
Potassium: 4.7 mEq/L (ref 3.7–5.3)
Sodium: 142 mEq/L (ref 137–147)
Total Protein: 6.8 g/dL (ref 6.0–8.3)

## 2013-03-19 LAB — CBC WITH DIFFERENTIAL/PLATELET
Basophils Absolute: 0 10*3/uL (ref 0.0–0.1)
Basophils Relative: 0 % (ref 0–1)
Eosinophils Absolute: 0.3 10*3/uL (ref 0.0–0.7)
Eosinophils Relative: 3 % (ref 0–5)
HEMATOCRIT: 36.7 % (ref 36.0–46.0)
Hemoglobin: 12.1 g/dL (ref 12.0–15.0)
LYMPHS PCT: 24 % (ref 12–46)
Lymphs Abs: 2.2 10*3/uL (ref 0.7–4.0)
MCH: 30.1 pg (ref 26.0–34.0)
MCHC: 33 g/dL (ref 30.0–36.0)
MCV: 91.3 fL (ref 78.0–100.0)
MONO ABS: 0.9 10*3/uL (ref 0.1–1.0)
MONOS PCT: 10 % (ref 3–12)
NEUTROS ABS: 6 10*3/uL (ref 1.7–7.7)
Neutrophils Relative %: 64 % (ref 43–77)
Platelets: 313 10*3/uL (ref 150–400)
RBC: 4.02 MIL/uL (ref 3.87–5.11)
RDW: 16 % — ABNORMAL HIGH (ref 11.5–15.5)
WBC: 9.4 10*3/uL (ref 4.0–10.5)

## 2013-03-19 MED ORDER — SODIUM CHLORIDE 0.9 % IV SOLN
Freq: Once | INTRAVENOUS | Status: AC
  Administered 2013-03-19: 12:00:00 via INTRAVENOUS

## 2013-03-19 MED ORDER — SODIUM CHLORIDE 0.9 % IV SOLN
15.0000 mg/kg | Freq: Once | INTRAVENOUS | Status: AC
  Administered 2013-03-19: 1325 mg via INTRAVENOUS
  Filled 2013-03-19: qty 53

## 2013-03-19 MED ORDER — SODIUM CHLORIDE 0.9 % IJ SOLN: 10.0000 mL | INTRAMUSCULAR | Status: DC | PRN

## 2013-03-19 MED ORDER — HEPARIN SOD (PORK) LOCK FLUSH 100 UNIT/ML IV SOLN
INTRAVENOUS | Status: AC
  Filled 2013-03-19: qty 5

## 2013-03-19 MED ORDER — HEPARIN SOD (PORK) LOCK FLUSH 100 UNIT/ML IV SOLN
500.0000 [IU] | Freq: Once | INTRAVENOUS | Status: AC | PRN
  Administered 2013-03-19: 500 [IU]

## 2013-03-19 NOTE — Progress Notes (Signed)
Labs drawn today for cbc/diff,cmp,ca125 

## 2013-03-19 NOTE — Patient Instructions (Signed)
..  [facility] Discharge Instructions  RECOMMENDATIONS MADE BY THE CONSULTANT AND ANY TEST RESULTS WILL BE SENT TO YOUR REFERRING PHYSICIAN.  EXAM FINDINGS BY THE PHYSICIAN TODAY AND SIGNS OR SYMPTOMS TO REPORT TO CLINIC OR PRIMARY PHYSICIAN: Exam and findings as discussed by Dr. Barnet Glasgow.  Continue avastin every 3 weeks. We will do labs and check a urine specimen before each infusion.  Thank you for choosing [facility] to provide your oncology and hematology care.  To afford each patient quality time with our providers, please arrive at least 15 minutes before your scheduled appointment time.  With your help, our goal is to use those 15 minutes to complete the necessary work-up to ensure our physicians have the information they need to help with your evaluation and healthcare recommendations.    Effective January 1st, 2014, we ask that you re-schedule your appointment with our physicians should you arrive 10 or more minutes late for your appointment.  We strive to give you quality time with our providers, and arriving late affects you and other patients whose appointments are after yours.    Again, thank you for choosing [facility].  Our hope is that these requests will decrease the amount of time that you wait before being seen by our physicians.       _____________________________________________________________  Should you have questions after your visit to [facility], please contact our office at [phone] between the hours of 8:30 a.m. and 5:00 p.m.  Voicemails left after 4:30 p.m. will not be returned until the following business day.  For prescription refill requests, have your pharmacy contact our office with your prescription refill request.

## 2013-03-19 NOTE — Progress Notes (Signed)
[place], [facility], MD  [place] [address]  DIAGNOSIS: Ovarian cancer, unspecified laterality  Peripheral neuropathy, secondary to drugs or chemicals  HTN (hypertension)  Chief Complaint  Patient presents with  . Ovarian Cancer    To begin maintenance with Avastin    CURRENT THERAPY: To begin maintenance therapy with Avastin today.  INTERVAL HISTORY: Rita Humphrey 73 y.o. female returns for followup and continuation of therapy, now in a maintenance mode, for ovarian cancer with abdominal carcinomatosis with negative PET CT scan done on 03/05/2013. She had been treated with 4 cycles of VP-16 following completion of 6 cycles of platinum based chemotherapy at which time severe neuropathy occurred and for that reason and because of allergy to carboplatin, she was treated with VP-16 after debulking surgery at which time minimal disease was left behind. She remains asymptomatic with good appetite. Cymbalta 60 mg daily is controlling peripheral neuropathy. She denies abdominal distention, nausea, vomiting, sore throat, epistaxis, PND, orthopnea, palpitations, melena, hematochezia, hematuria, vaginal bleeding, lower extremity swelling or redness, skin rash, headache, or seizures. Hair is returning.   MEDICAL HISTORY: Past Medical History  Diagnosis Date  . Hypertension   . Hyperlipemia   . Thyroid disease   . Asthma   . Adnexal mass 05/16/2012  . 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 Cancer     [place]  . Breast cancer     s/p bilateral mastectomy [id number]  . Ovarian cancer 05/17/2012  . 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 Peripheral vascular disease 2013    hx of DVT right leg  . History of kidney stones   . Anemia   . Platelets decreased april 2014     1 transfusion given before chemo  . Deafness in left ear   . Numbness in feet   .  Numbness of fingers of both hands   . Dizziness     occasional  . History of breast cancer 12/07/2012    INTERIM HISTORY: has Hypokalemia; Abdominal pain; HTN (hypertension); Hyperlipemia; Low back pain; Neck pain; Ascites; Adnexal mass; Unspecified constipation; Thrombocytosis; Ovarian cancer; Omental metastasis; Nausea with vomiting; Peripheral neuropathy, secondary to chemotherapy; and History of breast cancer on her problem list.   Stage IIIC Ovarian Cancer S/P 6 cycles of platinum-based chemotherapy From 06/07/2012- 09/20/2012, followed by optimal debulking by Dr. Alycia Rossetti. Completed 4 cycles of etoposide therapy with last cycle started on 02/18/2013. Repeat PET/CT scan done on 03/05/2013 showed no evidence of disease. Here today to begin maintenance treatment with Avastin.   ALLERGIES:  is allergic to gabapentin; penicillins; carboplatin; and morphine and related.  MEDICATIONS: has a current medication list which includes the following prescription(s): amlodipine, vitamin d3, docusate calcium, duloxetine, famotidine, lidocaine-prilocaine, ondansetron, potassium chloride sa, prochlorperazine, tetrahydroz-dextran-peg-povid, and vitamin b-6, and the following Facility-Administered Medications: sodium chloride, bevacizumab (AVASTIN) 1,325 mg in sodium chloride 0.9 % 100 mL chemo infusion, heparin lock flush, and sodium chloride.  SURGICAL HISTORY:  Past Surgical History  Procedure Laterality Date  . Thyroid lobectomy    . Foot surgery      2 spurs taken out of foot  . Varicose vein surgery    . Bladder tack surgery     . Tonsillectomy    . Mastectomy      bilateral   . Laparotomy Bilateral 06/05/2012  Procedure: EXPLORATORY LAPAROTOMY WITH PELVIC TUMOR BIOPSY;  Surgeon: Imagene Gurney A. Alycia Rossetti, MD;  Location: [facility];  Service: Gynecology;  Laterality: Bilateral;  . Abdominal hysterectomy  1980  . Vascular surgery      veins stripped from both legs  . Portacath placement Left 06/25/2012    Procedure:  INSERTION PORT-A-CATH;  Surgeon: Jamesetta So, MD;  Location: [facility];  Service: General;  Laterality: Left;  . Laparoscopy N/A 10/30/2012    Procedure: LAPAROSCOPY DIAGNOSTIC, EXPLORATORY LAPAROTOMY, BILATERAL SALPINGO-OOPHORECTOMY, TUMOR DEBULKING;  Surgeon: Imagene Gurney A. Alycia Rossetti, MD;  Location: [facility];  Service: Gynecology;  Laterality: N/A;    FAMILY HISTORY: family history includes Cancer in her mother; Heart attack in her brother; Hypertension in her father and sister.  SOCIAL HISTORY:  reports that she has never smoked. She has never used smokeless tobacco. She reports that she does not drink alcohol or use illicit drugs.  REVIEW OF SYSTEMS:  Other than that discussed above is noncontributory.  PHYSICAL EXAMINATION: ECOG PERFORMANCE STATUS: 1 - Symptomatic but completely ambulatory  There were no vitals taken for this visit.  GENERAL:alert, no distress and comfortable SKIN: skin color, texture, turgor are normal, no rashes or significant lesions EYES: PERLA; Conjunctiva are pink and non-injected, sclera clear OROPHARYNX:no exudate, no erythema on lips, buccal mucosa, or tongue. NECK: supple, thyroid normal size, non-tender, without nodularity. No masses CHEST: Normal AP diameter with light port in place. Bilateral mastectomy scars well healed. LYMPH:  no palpable lymphadenopathy in the cervical, axillary or inguinal LUNGS: clear to auscultation and percussion with normal breathing effort HEART: regular rate & rhythm and no murmurs. ABDOMEN:abdomen soft, non-tender and normal bowel sounds. No free fluid wave or shifting dullness. MUSCULOSKELETAL:no cyanosis of digits and no clubbing. Range of motion normal.  NEURO: alert & oriented x 3 with fluent speech, no focal motor/sensory deficits. Minimally diminished DTRs.   LABORATORY DATA: Infusion on 03/19/2013  Component Date Value Range Status  . Specific Gravity, Urine 03/19/2013 1.015  1.005 - 1.030 Final  . pH 03/19/2013 6.5  5.0 -  8.0 Final  . Glucose, UA 03/19/2013 NEGATIVE  NEGATIVE mg/dL Final  . Hgb urine dipstick 03/19/2013 NEGATIVE  NEGATIVE Final  . Bilirubin Urine 03/19/2013 NEGATIVE  NEGATIVE Final  . Ketones, ur 03/19/2013 NEGATIVE  NEGATIVE mg/dL Final  . Protein, ur 03/19/2013 NEGATIVE  NEGATIVE mg/dL Final  . Urobilinogen, UA 03/19/2013 0.2  0.0 - 1.0 mg/dL Final  . Nitrite 03/19/2013 NEGATIVE  NEGATIVE Final  . Leukocytes, UA 03/19/2013 TRACE* NEGATIVE Final  Hospital Outpatient Visit on 03/05/2013  Component Date Value Range Status  . Glucose-Capillary 03/05/2013 104* 70 - 99 mg/dL Final  Infusion on 02/18/2013  Component Date Value Range Status  . WBC 02/18/2013 10.4  4.0 - 10.5 K/uL Final  . RBC 02/18/2013 4.19  3.87 - 5.11 MIL/uL Final  . Hemoglobin 02/18/2013 12.6  12.0 - 15.0 g/dL Final  . HCT 02/18/2013 38.3  36.0 - 46.0 % Final  . MCV 02/18/2013 91.4  78.0 - 100.0 fL Final  . MCH 02/18/2013 30.1  26.0 - 34.0 pg Final  . MCHC 02/18/2013 32.9  30.0 - 36.0 g/dL Final  . RDW 02/18/2013 15.9* 11.5 - 15.5 % Final  . Platelets 02/18/2013 303  150 - 400 K/uL Final  . Neutrophils Relative % 02/18/2013 67  43 - 77 % Final  . Neutro Abs 02/18/2013 7.0  1.7 - 7.7 K/uL Final  . Lymphocytes Relative 02/18/2013 24  12 - 46 %  Final  . Lymphs Abs 02/18/2013 2.5  0.7 - 4.0 K/uL Final  . Monocytes Relative 02/18/2013 8  3 - 12 % Final  . Monocytes Absolute 02/18/2013 0.8  0.1 - 1.0 K/uL Final  . Eosinophils Relative 02/18/2013 1  0 - 5 % Final  . Eosinophils Absolute 02/18/2013 0.1  0.0 - 0.7 K/uL Final  . Basophils Relative 02/18/2013 0  0 - 1 % Final  . Basophils Absolute 02/18/2013 0.0  0.0 - 0.1 K/uL Final  . Sodium 02/18/2013 140  135 - 145 mEq/L Final  . Potassium 02/18/2013 3.8  3.5 - 5.1 mEq/L Final  . Chloride 02/18/2013 105  96 - 112 mEq/L Final  . CO2 02/18/2013 25  19 - 32 mEq/L Final  . Glucose, Bld 02/18/2013 101* 70 - 99 mg/dL Final  . BUN 02/18/2013 22  6 - 23 mg/dL Final  .  Creatinine, Ser 02/18/2013 0.77  0.50 - 1.10 mg/dL Final  . Calcium 02/18/2013 9.7  8.4 - 10.5 mg/dL Final  . Total Protein 02/18/2013 6.8  6.0 - 8.3 g/dL Final  . Albumin 02/18/2013 3.5  3.5 - 5.2 g/dL Final  . AST 02/18/2013 14  0 - 37 U/L Final  . ALT 02/18/2013 18  0 - 35 U/L Final  . Alkaline Phosphatase 02/18/2013 109  39 - 117 U/L Final  . Total Bilirubin 02/18/2013 0.3  0.3 - 1.2 mg/dL Final  . GFR calc non Af Amer 02/18/2013 82* >90 mL/min Final  . GFR calc Af Amer 02/18/2013 >90  >90 mL/min Final   Comment: (NOTE)                          The eGFR has been calculated using the CKD EPI equation.                          This calculation has not been validated in all clinical situations.                          eGFR's persistently <90 mL/min signify possible Chronic Kidney                          Disease.  . CA 125 02/18/2013 13.7  0.0 - 30.2 U/mL Final   Performed at [facility]: No new pathology.  Urinalysis    Component Value Date/Time   COLORURINE YELLOW 07/03/2012 1158   APPEARANCEUR CLEAR 07/03/2012 1158   LABSPEC 1.015 03/19/2013 1137   PHURINE 6.5 03/19/2013 1137   GLUCOSEU NEGATIVE 03/19/2013 1137   HGBUR NEGATIVE 03/19/2013 1137   BILIRUBINUR NEGATIVE 03/19/2013 1137   KETONESUR NEGATIVE 03/19/2013 1137   PROTEINUR NEGATIVE 03/19/2013 1137   UROBILINOGEN 0.2 03/19/2013 1137   NITRITE NEGATIVE 03/19/2013 1137   LEUKOCYTESUR TRACE* 03/19/2013 1137    RADIOGRAPHIC STUDIES: Nm Pet Image Initial (pi) Skull Base To Thigh  03/05/2013   CLINICAL DATA:  Subsequent treatment strategy for stage IIIC ovarian cancer. Patient underwent debulking on 06/05/2012. Repeat surgery on 10/30/2012.  EXAM: NUCLEAR MEDICINE PET SKULL BASE TO THIGH  FASTING BLOOD GLUCOSE:  Value: 138m/dl  TECHNIQUE: 18.5 mCi F-18 FDG was injected intravenously. CT data was obtained and used for attenuation correction and anatomic localization only. (This was not acquired as a diagnostic  CT examination.) Additional exam technical data entered on technologist  worksheet.  COMPARISON:  CT abdomen and pelvis from 10/01/2012.  FINDINGS: NECK  No hypermetabolic lymph nodes in the neck.  CHEST  No hypermetabolic mediastinal or hilar nodes. No suspicious pulmonary nodules on the CT scan.  The left-sided Port-A-Cath tip is in the left innominate vein, just proximal to the innominate vein confluence. .  ABDOMEN/PELVIS  No abnormal hypermetabolic activity within the liver, pancreas, adrenal glands, or spleen. No hypermetabolic lymph nodes in the abdomen or pelvis. 13 x 7 mm nodule in the gastrohepatic ligament was 18 x 7 mm previously. This shows no hypermetabolism on the PET images.  The omental nodularity seen previously is no longer evident. No abnormal FDG accumulation within the abdomen on today's PET images. Tiny soft tissue nodules just caudal to the lateral segment left liver are stable and show no hypermetabolism on the PET images.  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  SKELETON  No focal hypermetabolic activity to suggest skeletal metastasis. There is low level diffuse and relatively homogeneous FDG uptake within the marrow space, likely related to treatment mediated marrow stimulation.  IMPRESSION: No concerning focal areas of hypermetabolism in the neck, chest, abdomen, or pelvis on today's study. The abnormal soft tissue identified within the omentum on the previous study is no longer evident after surgery. Small lymph nodes or nodular areas of soft tissue in the gastrohepatic ligament and just inferior to the left liver are unchanged and show no hypermetabolism on today's PET images.  The left-sided Port-A-Cath tip is in the left innominate vein.   Electronically Signed   By: Misty Stanley M.D.   On: 03/05/2013  10:32    ASSESSMENT:  #1. Stage IIIc ovarian cancer, no evidence of disease, to begin maintenance therapy with Avastin today. #2. Peripheral neuropathy from previous chemotherapy, improved on Cymbalta. #3. History of allergy to carboplatin and with the emergence of neuropathy, VP-16 was chosen as treatment after debulking surgery preceded by 6 cycles of carboplatin and Taxol. #4. Hypertension, controlled.   PLAN:  #1. Avastin 15 mg per kilogram intravenously to be repeated every 3 weeks. #2. Patient was told to call should she develop epistaxis. She was told to monitor her blood pressure which could be affected by Avastin. #3. Followup in 3 weeks in association with cycle #2 of maintenance.   All questions were answered. The patient knows to call the clinic with any problems, questions or concerns. We can certainly see the patient much sooner if necessary.   I spent 25 minutes counseling the patient face to face. The total time spent in the appointment was 30 minutes.    Doroteo Bradford, MD 03/19/2013 12:06 PM

## 2013-03-20 ENCOUNTER — Telehealth (HOSPITAL_COMMUNITY): Payer: Self-pay | Admitting: Oncology

## 2013-03-20 LAB — CA 125: CA 125: 22 U/mL (ref 0.0–30.2)

## 2013-03-20 NOTE — Telephone Encounter (Signed)
Follow-up call to yesterday's 1st time Avastin - Rita Humphrey reports doing well and having no complaints or questions at this time.  Knows to contact us if issues.

## 2013-04-09 ENCOUNTER — Ambulatory Visit (HOSPITAL_COMMUNITY): Payer: Medicare Other

## 2013-04-09 ENCOUNTER — Inpatient Hospital Stay (HOSPITAL_COMMUNITY): Payer: Medicare Other

## 2013-04-09 ENCOUNTER — Other Ambulatory Visit (HOSPITAL_COMMUNITY): Payer: Medicare Other

## 2013-04-11 ENCOUNTER — Encounter (HOSPITAL_COMMUNITY): Payer: Medicare HMO | Attending: Oncology

## 2013-04-11 ENCOUNTER — Encounter (HOSPITAL_BASED_OUTPATIENT_CLINIC_OR_DEPARTMENT_OTHER): Payer: Medicare HMO

## 2013-04-11 ENCOUNTER — Encounter (HOSPITAL_COMMUNITY): Payer: Self-pay

## 2013-04-11 VITALS — BP 138/84 | HR 92 | Temp 97.8°F | Resp 18 | Wt 195.5 lb

## 2013-04-11 DIAGNOSIS — C786 Secondary malignant neoplasm of retroperitoneum and peritoneum: Secondary | ICD-10-CM

## 2013-04-11 DIAGNOSIS — C569 Malignant neoplasm of unspecified ovary: Secondary | ICD-10-CM

## 2013-04-11 DIAGNOSIS — Z9889 Other specified postprocedural states: Secondary | ICD-10-CM | POA: Insufficient documentation

## 2013-04-11 DIAGNOSIS — Z5112 Encounter for antineoplastic immunotherapy: Secondary | ICD-10-CM

## 2013-04-11 DIAGNOSIS — G609 Hereditary and idiopathic neuropathy, unspecified: Secondary | ICD-10-CM

## 2013-04-11 DIAGNOSIS — E876 Hypokalemia: Secondary | ICD-10-CM | POA: Insufficient documentation

## 2013-04-11 DIAGNOSIS — Z452 Encounter for adjustment and management of vascular access device: Secondary | ICD-10-CM

## 2013-04-11 DIAGNOSIS — Z95828 Presence of other vascular implants and grafts: Secondary | ICD-10-CM

## 2013-04-11 LAB — CBC WITH DIFFERENTIAL/PLATELET
BASOS ABS: 0 10*3/uL (ref 0.0–0.1)
BASOS PCT: 0 % (ref 0–1)
EOS ABS: 0.2 10*3/uL (ref 0.0–0.7)
EOS PCT: 4 % (ref 0–5)
HCT: 41.5 % (ref 36.0–46.0)
Hemoglobin: 13.4 g/dL (ref 12.0–15.0)
Lymphocytes Relative: 33 % (ref 12–46)
Lymphs Abs: 2.1 10*3/uL (ref 0.7–4.0)
MCH: 29.3 pg (ref 26.0–34.0)
MCHC: 32.3 g/dL (ref 30.0–36.0)
MCV: 90.6 fL (ref 78.0–100.0)
Monocytes Absolute: 0.5 10*3/uL (ref 0.1–1.0)
Monocytes Relative: 8 % (ref 3–12)
Neutro Abs: 3.4 10*3/uL (ref 1.7–7.7)
Neutrophils Relative %: 54 % (ref 43–77)
Platelets: 258 10*3/uL (ref 150–400)
RBC: 4.58 MIL/uL (ref 3.87–5.11)
RDW: 14.8 % (ref 11.5–15.5)
WBC: 6.2 10*3/uL (ref 4.0–10.5)

## 2013-04-11 LAB — COMPREHENSIVE METABOLIC PANEL
ALBUMIN: 3.8 g/dL (ref 3.5–5.2)
ALK PHOS: 102 U/L (ref 39–117)
ALT: 24 U/L (ref 0–35)
AST: 21 U/L (ref 0–37)
BUN: 18 mg/dL (ref 6–23)
CHLORIDE: 103 meq/L (ref 96–112)
CO2: 26 mEq/L (ref 19–32)
Calcium: 9.7 mg/dL (ref 8.4–10.5)
Creatinine, Ser: 0.73 mg/dL (ref 0.50–1.10)
GFR, EST NON AFRICAN AMERICAN: 83 mL/min — AB (ref >90)
Glucose, Bld: 114 mg/dL — ABNORMAL HIGH (ref 70–99)
POTASSIUM: 4.4 meq/L (ref 3.7–5.3)
Sodium: 140 mEq/L (ref 137–147)
Total Bilirubin: 0.4 mg/dL (ref 0.3–1.2)
Total Protein: 7.4 g/dL (ref 6.0–8.3)

## 2013-04-11 LAB — URINALYSIS, DIPSTICK ONLY
Bilirubin Urine: NEGATIVE
Glucose, UA: NEGATIVE mg/dL
HGB URINE DIPSTICK: NEGATIVE
Ketones, ur: NEGATIVE mg/dL
Nitrite: NEGATIVE
PROTEIN: NEGATIVE mg/dL
Specific Gravity, Urine: 1.02 (ref 1.005–1.030)
Urobilinogen, UA: 0.2 mg/dL (ref 0.0–1.0)
pH: 6 (ref 5.0–8.0)

## 2013-04-11 MED ORDER — STERILE WATER FOR INJECTION IJ SOLN
INTRAMUSCULAR | Status: AC
  Filled 2013-04-11: qty 10

## 2013-04-11 MED ORDER — HEPARIN SOD (PORK) LOCK FLUSH 100 UNIT/ML IV SOLN
500.0000 [IU] | Freq: Once | INTRAVENOUS | Status: AC | PRN
  Administered 2013-04-11: 500 [IU]
  Filled 2013-04-11: qty 5

## 2013-04-11 MED ORDER — ALTEPLASE 2 MG IJ SOLR
INTRAMUSCULAR | Status: AC
  Filled 2013-04-11: qty 2

## 2013-04-11 MED ORDER — SODIUM CHLORIDE 0.9 % IV SOLN
Freq: Once | INTRAVENOUS | Status: AC
  Administered 2013-04-11: 12:00:00 via INTRAVENOUS

## 2013-04-11 MED ORDER — STERILE WATER FOR INJECTION IJ SOLN
2.2000 mL | Freq: Once | INTRAMUSCULAR | Status: AC
  Administered 2013-04-11: 2.2 mL via INTRAMUSCULAR

## 2013-04-11 MED ORDER — SODIUM CHLORIDE 0.9 % IJ SOLN
10.0000 mL | INTRAMUSCULAR | Status: DC | PRN
  Administered 2013-04-11: 10 mL

## 2013-04-11 MED ORDER — ALTEPLASE 2 MG IJ SOLR
2.0000 mg | Freq: Once | INTRAMUSCULAR | Status: AC | PRN
  Administered 2013-04-11: 2 mg

## 2013-04-11 MED ORDER — POTASSIUM CHLORIDE CRYS ER 20 MEQ PO TBCR: 20.0000 meq | EXTENDED_RELEASE_TABLET | Freq: Every day | ORAL | Status: DC

## 2013-04-11 MED ORDER — SODIUM CHLORIDE 0.9 % IV SOLN
15.0000 mg/kg | Freq: Once | INTRAVENOUS | Status: AC
  Administered 2013-04-11: 1325 mg via INTRAVENOUS
  Filled 2013-04-11: qty 53

## 2013-04-11 NOTE — Progress Notes (Signed)
Labs drawn today for cbc/diff,ca125,cmp

## 2013-04-11 NOTE — Progress Notes (Signed)
Rita Humphrey  OFFICE PROGRESS NOTE  [place], [facility], MD  [place] [address]  DIAGNOSIS: Hypokalemia - Plan: potassium chloride SA (K-DUR,KLOR-CON) 20 MEQ tablet  Omental metastasis  Chief Complaint  Patient presents with  . Ovarian Cancer    follow-up    CURRENT THERAPY: Avastin intravenously every 3 weeks as maintenance therapy  INTERVAL HISTORY: Rita Humphrey 73 y.o. female returns for followup a receiving Avastin maintenance therapy for stage IIIC ovarian cancer, status post 6 cycles of platinum-based chemotherapy from 06/07/2012 through 09/20/2012 followed by optimal debulking by Dr. Alycia Humphrey followed by 4 cycles of etoposide therapy with last cycle started on 02/18/2013 with repeat PET scan done on 03/05/2013 showing no evidence of disease. Started on maintenance Avastin on 03/19/2013 to increase progression free survival.  She tolerated the Avastin well with no episodes of epistaxis, hematuria, melena, hematochezia, or hemoptysis. She denies a skin rash, diarrhea, PND, orthopnea, palpitations, fever, night sweats, worsening lower extremity swelling that does not dissipate over night, headache, or seizures. Peripheral paresthesias are improving or at least being controlled on Cymbalta.  MEDICAL HISTORY: Past Medical History  Diagnosis Date  . Hypertension   . Hyperlipemia   . Thyroid disease   . Asthma   . Adnexal mass 05/16/2012  . 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 Cancer     [place]  . Breast cancer     s/p bilateral mastectomy [id number]  . Ovarian cancer 05/17/2012  . 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 Peripheral vascular disease 2013    hx of DVT right leg  . History of kidney stones   . Anemia   . Platelets decreased april 2014     1 transfusion given before chemo  . Deafness in left ear   . Numbness in feet   . Numbness of fingers of both hands   .  Dizziness     occasional  . History of breast cancer 12/07/2012    INTERIM HISTORY: has Hypokalemia; Abdominal pain; HTN (hypertension); Hyperlipemia; Low back pain; Neck pain; Ascites; Adnexal mass; Unspecified constipation; Thrombocytosis; Ovarian cancer; Omental metastasis; Nausea with vomiting; Peripheral neuropathy, secondary to chemotherapy; and History of breast cancer on her problem list.   Stage IIIC Ovarian Cancer S/P 6 cycles of platinum-based chemotherapy From 06/07/2012- 09/20/2012, followed by optimal debulking by Dr. Alycia Humphrey. Completed 4 cycles of etoposide therapy with last cycle started on 02/18/2013. Repeat PET/CT scan done on 03/05/2013 showed no evidence of disease. Maintenance therapy with Avastin started on 03/20/2011.  ALLERGIES:  is allergic to gabapentin; penicillins; carboplatin; and morphine and related.  MEDICATIONS: has a current medication list which includes the following prescription(s): amlodipine, vitamin d3, docusate calcium, duloxetine, lidocaine-prilocaine, oxycodone-acetaminophen, potassium chloride sa, temazepam, tetrahydroz-dextran-peg-povid, vitamin b-6, famotidine, ondansetron, and prochlorperazine, and the following Facility-Administered Medications: sodium chloride, bevacizumab (AVASTIN) 1,325 mg in sodium chloride 0.9 % 100 mL chemo infusion, heparin lock flush, and sodium chloride.  SURGICAL HISTORY:  Past Surgical History  Procedure Laterality Date  . Thyroid lobectomy    . Foot surgery      2 spurs taken out of foot  . Varicose vein surgery    . Bladder tack surgery     . Tonsillectomy    . Mastectomy      bilateral   . Laparotomy Bilateral 06/05/2012    Procedure: EXPLORATORY LAPAROTOMY WITH PELVIC TUMOR BIOPSY;  Surgeon: Rita Humphrey. Rita Rossetti, MD;  Location: [facility];  Service: Gynecology;  Laterality: Bilateral;  . Abdominal hysterectomy  1980  . Vascular surgery      veins stripped from both legs  . Portacath placement Left 06/25/2012    Procedure:  INSERTION PORT-A-CATH;  Surgeon: Rita So, MD;  Location: [facility];  Service: General;  Laterality: Left;  . Laparoscopy N/A 10/30/2012    Procedure: LAPAROSCOPY DIAGNOSTIC, EXPLORATORY LAPAROTOMY, BILATERAL SALPINGO-OOPHORECTOMY, TUMOR DEBULKING;  Surgeon: Rita Gurney A. Rita Rossetti, MD;  Location: [facility];  Service: Gynecology;  Laterality: N/A;    FAMILY HISTORY: family history includes Cancer in her mother; Heart attack in her brother; Hypertension in her father and sister.  SOCIAL HISTORY:  reports that she has never smoked. She has never used smokeless tobacco. She reports that she does not drink alcohol or use illicit drugs.  REVIEW OF SYSTEMS:  Other than that discussed above is noncontributory.  PHYSICAL EXAMINATION: ECOG PERFORMANCE STATUS: 1 - Symptomatic but completely ambulatory  Blood pressure 138/84, pulse 92, temperature 97.8 F (36.6 C), temperature source Oral, resp. rate 18, weight 195 lb 8 oz (88.678 kg).  GENERAL:alert, no distress and comfortable SKIN: skin color, texture, turgor are normal, no rashes or significant lesions EYES: PERLA; Conjunctiva are pink and non-injected, sclera clear OROPHARYNX:no exudate, no erythema on lips, buccal mucosa, or tongue. NECK: supple, thyroid normal size, non-tender, without nodularity. No masses CHEST: Status post left mastectomy with no subcutaneous nodules. LYMPH:  no palpable lymphadenopathy in the cervical, axillary or inguinal LUNGS: clear to auscultation and percussion with normal breathing effort HEART: regular rate & rhythm and no murmurs. ABDOMEN:abdomen soft, non-tender and normal bowel sounds. Surgical well-healed. No free fluid wave, or shifting dullness, or abdominal masses. Liver and spleen not enlarged. MUSCULOSKELETAL:no cyanosis of digits and no clubbing. Range of motion normal.  NEURO: alert & oriented x 3 with fluent speech, no focal motor/sensory deficits   LABORATORY DATA: Infusion on 04/11/2013  Component Date  Value Range Status  . Specific Gravity, Urine 04/11/2013 1.020  1.005 - 1.030 Final  . pH 04/11/2013 6.0  5.0 - 8.0 Final  . Glucose, UA 04/11/2013 NEGATIVE  NEGATIVE mg/dL Final  . Hgb urine dipstick 04/11/2013 NEGATIVE  NEGATIVE Final  . Bilirubin Urine 04/11/2013 NEGATIVE  NEGATIVE Final  . Ketones, ur 04/11/2013 NEGATIVE  NEGATIVE mg/dL Final  . Protein, ur 04/11/2013 NEGATIVE  NEGATIVE mg/dL Final  . Urobilinogen, UA 04/11/2013 0.2  0.0 - 1.0 mg/dL Final  . Nitrite 04/11/2013 NEGATIVE  NEGATIVE Final  . Leukocytes, UA 04/11/2013 SMALL* NEGATIVE Final  Infusion on 03/19/2013  Component Date Value Range Status  . WBC 03/19/2013 9.4  4.0 - 10.5 K/uL Final  . RBC 03/19/2013 4.02  3.87 - 5.11 MIL/uL Final  . Hemoglobin 03/19/2013 12.1  12.0 - 15.0 g/dL Final  . HCT 03/19/2013 36.7  36.0 - 46.0 % Final  . MCV 03/19/2013 91.3  78.0 - 100.0 fL Final  . MCH 03/19/2013 30.1  26.0 - 34.0 pg Final  . MCHC 03/19/2013 33.0  30.0 - 36.0 g/dL Final  . RDW 03/19/2013 16.0* 11.5 - 15.5 % Final  . Platelets 03/19/2013 313  150 - 400 K/uL Final  . Neutrophils Relative % 03/19/2013 64  43 - 77 % Final  . Neutro Abs 03/19/2013 6.0  1.7 - 7.7 K/uL Final  . Lymphocytes Relative 03/19/2013 24  12 - 46 % Final  . Lymphs Abs 03/19/2013 2.2  0.7 - 4.0 K/uL Final  .  Monocytes Relative 03/19/2013 10  3 - 12 % Final  . Monocytes Absolute 03/19/2013 0.9  0.1 - 1.0 K/uL Final  . Eosinophils Relative 03/19/2013 3  0 - 5 % Final  . Eosinophils Absolute 03/19/2013 0.3  0.0 - 0.7 K/uL Final  . Basophils Relative 03/19/2013 0  0 - 1 % Final  . Basophils Absolute 03/19/2013 0.0  0.0 - 0.1 K/uL Final  . Sodium 03/19/2013 142  137 - 147 mEq/L Final  . Potassium 03/19/2013 4.7  3.7 - 5.3 mEq/L Final  . Chloride 03/19/2013 104  96 - 112 mEq/L Final  . CO2 03/19/2013 27  19 - 32 mEq/L Final  . Glucose, Bld 03/19/2013 98  70 - 99 mg/dL Final  . BUN 03/19/2013 19  6 - 23 mg/dL Final  . Creatinine, Ser 03/19/2013 0.77   0.50 - 1.10 mg/dL Final  . Calcium 03/19/2013 9.7  8.4 - 10.5 mg/dL Final  . Total Protein 03/19/2013 6.8  6.0 - 8.3 g/dL Final  . Albumin 03/19/2013 3.6  3.5 - 5.2 g/dL Final  . AST 03/19/2013 16  0 - 37 U/L Final  . ALT 03/19/2013 18  0 - 35 U/L Final  . Alkaline Phosphatase 03/19/2013 110  39 - 117 U/L Final  . Total Bilirubin 03/19/2013 0.3  0.3 - 1.2 mg/dL Final  . GFR calc non Af Amer 03/19/2013 82* >90 mL/min Final  . GFR calc Af Amer 03/19/2013 >90  >90 mL/min Final   Comment: (NOTE)                          The eGFR has been calculated using the CKD EPI equation.                          This calculation has not been validated in all clinical situations.                          eGFR's persistently <90 mL/min signify possible Chronic Kidney                          Disease.  . CA 125 03/19/2013 22.0  0.0 - 30.2 U/mL Final   Performed at [facility]  . Specific Gravity, Urine 03/19/2013 1.015  1.005 - 1.030 Final  . pH 03/19/2013 6.5  5.0 - 8.0 Final  . Glucose, UA 03/19/2013 NEGATIVE  NEGATIVE mg/dL Final  . Hgb urine dipstick 03/19/2013 NEGATIVE  NEGATIVE Final  . Bilirubin Urine 03/19/2013 NEGATIVE  NEGATIVE Final  . Ketones, ur 03/19/2013 NEGATIVE  NEGATIVE mg/dL Final  . Protein, ur 03/19/2013 NEGATIVE  NEGATIVE mg/dL Final  . Urobilinogen, UA 03/19/2013 0.2  0.0 - 1.0 mg/dL Final  . Nitrite 03/19/2013 NEGATIVE  NEGATIVE Final  . Leukocytes, UA 03/19/2013 TRACE* NEGATIVE Final    PATHOLOGY: No new pathology.  Urinalysis    Component Value Date/Time   COLORURINE YELLOW 07/03/2012 1158   APPEARANCEUR CLEAR 07/03/2012 1158   LABSPEC 1.020 04/11/2013 1005   PHURINE 6.0 04/11/2013 1005   GLUCOSEU NEGATIVE 04/11/2013 1005   HGBUR NEGATIVE 04/11/2013 1005   BILIRUBINUR NEGATIVE 04/11/2013 1005   KETONESUR NEGATIVE 04/11/2013 1005   PROTEINUR NEGATIVE 04/11/2013 1005   UROBILINOGEN 0.2 04/11/2013 1005   NITRITE NEGATIVE 04/11/2013 1005   LEUKOCYTESUR SMALL* 04/11/2013 1005     RADIOGRAPHIC STUDIES: NM PET  Image Initial (PI) Skull Base To Thigh Status: Final result         PACS Images    Show images for NM PET Image Initial (PI) Skull Base To Thigh         Study Result    CLINICAL DATA: Subsequent treatment strategy for stage IIIC ovarian  cancer. Patient underwent debulking on 06/05/2012. Repeat surgery on  10/30/2012.  EXAM:  NUCLEAR MEDICINE PET SKULL BASE TO THIGH  FASTING BLOOD GLUCOSE: Value: 132m/dl  TECHNIQUE:  18.5 mCi F-18 FDG was injected intravenously. CT data was obtained  and used for attenuation correction and anatomic localization only.  (This was not acquired as a diagnostic CT examination.) Additional  exam technical data entered on technologist worksheet.  COMPARISON: CT abdomen and pelvis from 10/01/2012.  FINDINGS:  NECK  No hypermetabolic lymph nodes in the neck.  CHEST  No hypermetabolic mediastinal or hilar nodes. No suspicious  pulmonary nodules on the CT scan.  The left-sided Port-A-Cath tip is in the left innominate vein, just  proximal to the innominate vein confluence. .  ABDOMEN/PELVIS  No abnormal hypermetabolic activity within the liver, pancreas,  adrenal glands, or spleen. No hypermetabolic lymph nodes in the  abdomen or pelvis. 13 x 7 mm nodule in the gastrohepatic ligament  was 18 x 7 mm previously. This shows no hypermetabolism on the PET  images.  The omental nodularity seen previously is no longer evident. No  abnormal FDG accumulation within the abdomen on today's PET images.  Tiny soft tissue nodules just caudal to the lateral segment left  liver are stable and show no hypermetabolism on the PET images.  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  SKELETON  No focal hypermetabolic activity to suggest skeletal  metastasis.  There is low level diffuse and relatively homogeneous FDG uptake  within the marrow space, likely related to treatment mediated marrow  stimulation.  IMPRESSION:  No concerning focal areas of hypermetabolism in the neck, chest,  abdomen, or pelvis on today's study. The abnormal soft tissue  identified within the omentum on the previous study is no longer  evident after surgery. Small lymph nodes or nodular areas of soft  tissue in the gastrohepatic ligament and just inferior to the left  liver are unchanged and show no hypermetabolism on today's PET  images.  The left-sided Port-A-Cath tip is in the left innominate vein.  Electronically Signed  By: EMisty StanleyM.D.  On: 03/05/2013 10:32      ASSESSMENT:  #1. Stage IIIc ovarian cancer, no evidence of disease, to begin maintenance therapy with Avastin today.  #2. Peripheral neuropathy from previous chemotherapy, improved on Cymbalta.  #3. History of allergy to carboplatin and with the emergence of neuropathy, VP-16 was chosen as treatment after debulking surgery preceded by 6 cycles of carboplatin and Taxol.  #4. Hypertension, controlled    PLAN:  #1. Avastin 15 mg per kilogram intravenously today. #2. Patient was told to call should she develop epistaxis and to monitor her blood pressure at home which could be affected by Avastin. 3. Followup in 3 weeks for continuation of treatment.    All questions were answered. The patient knows to call the clinic with any problems, questions or concerns. We can certainly see the patient much sooner if necessary.   I spent 25  minutes counseling the patient face to face. The total time spent in the appointment was 30 minutes.    Doroteo Bradford, MD 04/11/2013 11:00 AM

## 2013-04-11 NOTE — Patient Instructions (Signed)
[place] Discharge Instructions  RECOMMENDATIONS MADE BY THE CONSULTANT AND ANY TEST RESULTS WILL BE SENT TO YOUR REFERRING PHYSICIAN.  EXAM FINDINGS BY THE PHYSICIAN TODAY AND SIGNS OR SYMPTOMS TO REPORT TO CLINIC OR PRIMARY PHYSICIAN: Exam and findings as discussed by Dr. Barnet Glasgow.  MEDICATIONS PRESCRIBED:  1.  Potassium has been electronically to your pharmacy - please start the medication today as prescribed.  INSTRUCTIONS/FOLLOW-UP: 1. Return in 3 weeks for your Avastin infusion and to see T. Kefalas, PA-C as scheduled.  Sooner if questions or concerns.  Thank you!  Thank you for choosing [facility] to provide your oncology and hematology care.  To afford each patient quality time with our providers, please arrive at least 15 minutes before your scheduled appointment time.  With your help, our goal is to use those 15 minutes to complete the necessary work-up to ensure our physicians have the information they need to help with your evaluation and healthcare recommendations.    Effective January 1st, 2014, we ask that you re-schedule your appointment with our physicians should you arrive 10 or more minutes late for your appointment.  We strive to give you quality time with our providers, and arriving late affects you and other patients whose appointments are after yours.    Again, thank you for choosing [facility].  Our hope is that these requests will decrease the amount of time that you wait before being seen by our physicians.       _____________________________________________________________  Should you have questions after your visit to [facility], please contact our office at [phone] between the hours of 8:30 a.m. and 5:00 p.m.  Voicemails left after 4:30 p.m. will not be returned until the following business day.  For prescription refill requests, have your pharmacy contact our office with your prescription  refill request.

## 2013-04-11 NOTE — Progress Notes (Signed)
1100 Unable to withdraw any blood from port access after multiple attempts and saline flushes. Instilled Al;teplase 2 cc per protocol. 1145 Able to withdraw 2 cc alteplase plus blood. Several more attempts to withdraw more blood was unsuccessful. Saline infusion started prior to avastin. 1245 At end of infusion port again flushed with saline and several attempts made to withdraw blood were unsuccessful. Flushed port per protocol and de-accessed. Pt tolerated all well.

## 2013-04-12 LAB — CA 125: CA 125: 12.7 U/mL (ref 0.0–30.2)

## 2013-05-02 ENCOUNTER — Ambulatory Visit (HOSPITAL_COMMUNITY): Payer: Medicare Other | Admitting: Oncology

## 2013-05-02 ENCOUNTER — Inpatient Hospital Stay (HOSPITAL_COMMUNITY): Payer: Medicare HMO

## 2013-05-06 ENCOUNTER — Encounter (HOSPITAL_COMMUNITY): Payer: Medicare Other | Attending: Hematology and Oncology

## 2013-05-06 ENCOUNTER — Ambulatory Visit (HOSPITAL_COMMUNITY)
Admission: RE | Admit: 2013-05-06 | Discharge: 2013-05-06 | Disposition: A | Discharge status: Home / Self Care | Payer: Medicare HMO | Source: Ambulatory Visit | Attending: Hematology and Oncology | Admitting: Hematology and Oncology

## 2013-05-06 ENCOUNTER — Encounter (HOSPITAL_COMMUNITY): Payer: Medicare HMO | Attending: Oncology

## 2013-05-06 ENCOUNTER — Encounter (HOSPITAL_COMMUNITY): Payer: Self-pay

## 2013-05-06 VITALS — BP 138/71 | HR 85 | Temp 98.0°F | Resp 20 | Wt 196.6 lb

## 2013-05-06 DIAGNOSIS — R82998 Other abnormal findings in urine: Secondary | ICD-10-CM | POA: Insufficient documentation

## 2013-05-06 DIAGNOSIS — M25561 Pain in right knee: Secondary | ICD-10-CM

## 2013-05-06 DIAGNOSIS — G620 Drug-induced polyneuropathy: Secondary | ICD-10-CM

## 2013-05-06 DIAGNOSIS — M25569 Pain in unspecified knee: Secondary | ICD-10-CM | POA: Insufficient documentation

## 2013-05-06 DIAGNOSIS — M1710 Unilateral primary osteoarthritis, unspecified knee: Secondary | ICD-10-CM | POA: Insufficient documentation

## 2013-05-06 DIAGNOSIS — C786 Secondary malignant neoplasm of retroperitoneum and peritoneum: Secondary | ICD-10-CM

## 2013-05-06 DIAGNOSIS — I10 Essential (primary) hypertension: Secondary | ICD-10-CM

## 2013-05-06 DIAGNOSIS — C569 Malignant neoplasm of unspecified ovary: Secondary | ICD-10-CM | POA: Insufficient documentation

## 2013-05-06 DIAGNOSIS — IMO0002 Reserved for concepts with insufficient information to code with codable children: Secondary | ICD-10-CM

## 2013-05-06 DIAGNOSIS — Z5112 Encounter for antineoplastic immunotherapy: Secondary | ICD-10-CM

## 2013-05-06 LAB — URINALYSIS, DIPSTICK ONLY
Bilirubin Urine: NEGATIVE
Glucose, UA: NEGATIVE mg/dL
Hgb urine dipstick: NEGATIVE
Ketones, ur: NEGATIVE mg/dL
Nitrite: NEGATIVE
PH: 5.5 (ref 5.0–8.0)
Protein, ur: NEGATIVE mg/dL
Specific Gravity, Urine: >1.03 — ABNORMAL HIGH (ref 1.005–1.030)
UROBILINOGEN UA: 0.2 mg/dL (ref 0.0–1.0)

## 2013-05-06 LAB — COMPREHENSIVE METABOLIC PANEL
ALBUMIN: 3.7 g/dL (ref 3.5–5.2)
ALT: 27 U/L (ref 0–35)
AST: 24 U/L (ref 0–37)
Alkaline Phosphatase: 95 U/L (ref 39–117)
BUN: 14 mg/dL (ref 6–23)
CALCIUM: 9.6 mg/dL (ref 8.4–10.5)
CO2: 25 mEq/L (ref 19–32)
CREATININE: 0.79 mg/dL (ref 0.50–1.10)
Chloride: 103 mEq/L (ref 96–112)
GFR calc Af Amer: >90 mL/min (ref >90)
GFR calc non Af Amer: 81 mL/min — ABNORMAL LOW (ref >90)
Glucose, Bld: 90 mg/dL (ref 70–99)
Potassium: 4.2 mEq/L (ref 3.7–5.3)
Sodium: 140 mEq/L (ref 137–147)
Total Bilirubin: 0.4 mg/dL (ref 0.3–1.2)
Total Protein: 7.2 g/dL (ref 6.0–8.3)

## 2013-05-06 LAB — CBC WITH DIFFERENTIAL/PLATELET
BASOS ABS: 0 10*3/uL (ref 0.0–0.1)
Basophils Relative: 0 % (ref 0–1)
EOS PCT: 3 % (ref 0–5)
Eosinophils Absolute: 0.2 10*3/uL (ref 0.0–0.7)
HCT: 42.2 % (ref 36.0–46.0)
Hemoglobin: 13.8 g/dL (ref 12.0–15.0)
Lymphocytes Relative: 36 % (ref 12–46)
Lymphs Abs: 2.7 10*3/uL (ref 0.7–4.0)
MCH: 29.2 pg (ref 26.0–34.0)
MCHC: 32.7 g/dL (ref 30.0–36.0)
MCV: 89.2 fL (ref 78.0–100.0)
MONO ABS: 0.6 10*3/uL (ref 0.1–1.0)
Monocytes Relative: 8 % (ref 3–12)
NEUTROS ABS: 4 10*3/uL (ref 1.7–7.7)
Neutrophils Relative %: 53 % (ref 43–77)
Platelets: 279 10*3/uL (ref 150–400)
RBC: 4.73 MIL/uL (ref 3.87–5.11)
RDW: 13.9 % (ref 11.5–15.5)
WBC: 7.5 10*3/uL (ref 4.0–10.5)

## 2013-05-06 MED ORDER — SODIUM CHLORIDE 0.9 % IV SOLN
Freq: Once | INTRAVENOUS | Status: AC
  Administered 2013-05-06: 14:00:00 via INTRAVENOUS

## 2013-05-06 MED ORDER — HEPARIN SOD (PORK) LOCK FLUSH 100 UNIT/ML IV SOLN
500.0000 [IU] | Freq: Once | INTRAVENOUS | Status: AC | PRN
  Administered 2013-05-06: 500 [IU]
  Filled 2013-05-06: qty 5

## 2013-05-06 MED ORDER — BEVACIZUMAB CHEMO INJECTION 400 MG/16ML
15.0000 mg/kg | Freq: Once | INTRAVENOUS | Status: AC
  Administered 2013-05-06: 1325 mg via INTRAVENOUS
  Filled 2013-05-06: qty 53

## 2013-05-06 MED ORDER — SODIUM CHLORIDE 0.9 % IJ SOLN
10.0000 mL | INTRAMUSCULAR | Status: DC | PRN
  Administered 2013-05-06: 10 mL

## 2013-05-06 NOTE — Progress Notes (Signed)
[facility]  OFFICE PROGRESS NOTE  [place], [facility], MD  [place] [address]  DIAGNOSIS: Ovarian cancer - Plan: CT Abdomen Pelvis W Contrast, CT Chest W Contrast  Omental metastasis  Peripheral neuropathy, secondary to drugs or chemicals  Right knee pain - Plan: DG Knee 1-2 Views Right  Chief Complaint  Patient presents with  . Peritoneal carcinomatosis    Avastin maintenance  . Right knee pain    CURRENT THERAPY: Avastin maintenance for cycle #3 today  INTERVAL HISTORY: Rita Humphrey 73 y.o. female returns for followup and continuation of therapy for peritoneal carcinomatosis, currently on Avastin maintenance for cycle #3 today treatment having been started on 03/19/2013.  Primary problem has been pain in the right knee with intermittent swelling despite utilizing either warm or cold compresses. She has had chronic pain in that area for a long time but the pain has persisted over the past month. It makes it difficult for her to walk and is producing pain in the right hip as well. She's had regular bowel movements with no nausea, vomiting, melena, hematochezia, hematuria, vaginal bleeding, lower extremity swelling or redness, chest pain, PND, orthopnea, palpitations, skin rash, headache, or seizures.  MEDICAL HISTORY: Past Medical History  Diagnosis Date  . Hypertension   . Hyperlipemia   . Thyroid disease   . Asthma   . Adnexal mass 05/16/2012  . 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 Cancer     [place]  . Breast cancer     s/p bilateral mastectomy [id number]  . Ovarian cancer 05/17/2012  . 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 Peripheral vascular disease 2013    hx of DVT right leg  . History of kidney stones   . Anemia   . Platelets decreased april 2014     1 transfusion given before chemo  . Deafness in left ear   . Numbness in feet   . Numbness of fingers of  both hands   . Dizziness     occasional  . History of breast cancer 12/07/2012    INTERIM HISTORY: has Hypokalemia; Abdominal pain; HTN (hypertension); Hyperlipemia; Low back pain; Neck pain; Ascites; Adnexal mass; Unspecified constipation; Thrombocytosis; Ovarian cancer; Omental metastasis; Nausea with vomiting; Peripheral neuropathy, secondary to chemotherapy; and History of breast cancer on her problem list.   Stage IIIC Ovarian Cancer S/P 6 cycles of platinum-based chemotherapy From 06/07/2012- 09/20/2012, followed by optimal debulking by Dr. Alycia Rossetti. Completed 4 cycles of etoposide therapy with last cycle started on 02/18/2013. Repeat PET/CT scan done on 03/05/2013 showed no evidence of disease. Maintenance therapy with Avastin started on 03/19/2013  ALLERGIES:  is allergic to gabapentin; penicillins; carboplatin; and morphine and related.  MEDICATIONS: has a current medication list which includes the following prescription(s): amlodipine, vitamin d3, docusate calcium, duloxetine, famotidine, lidocaine-prilocaine, ondansetron, oxycodone-acetaminophen, potassium chloride sa, prochlorperazine, temazepam, tetrahydroz-dextran-peg-povid, and vitamin b-6, and the following Facility-Administered Medications: heparin lock flush and sodium chloride.  SURGICAL HISTORY:  Past Surgical History  Procedure Laterality Date  . Thyroid lobectomy    . Foot surgery      2 spurs taken out of foot  . Varicose vein surgery    . Bladder tack surgery     . Tonsillectomy    . Mastectomy      bilateral   . Laparotomy Bilateral 06/05/2012    Procedure: EXPLORATORY LAPAROTOMY  WITH PELVIC TUMOR BIOPSY;  Surgeon: Imagene Gurney A. Alycia Rossetti, MD;  Location: [facility];  Service: Gynecology;  Laterality: Bilateral;  . Abdominal hysterectomy  1980  . Vascular surgery      veins stripped from both legs  . Portacath placement Left 06/25/2012    Procedure: INSERTION PORT-A-CATH;  Surgeon: Jamesetta So, MD;  Location: [facility];  Service:  General;  Laterality: Left;  . Laparoscopy N/A 10/30/2012    Procedure: LAPAROSCOPY DIAGNOSTIC, EXPLORATORY LAPAROTOMY, BILATERAL SALPINGO-OOPHORECTOMY, TUMOR DEBULKING;  Surgeon: Imagene Gurney A. Alycia Rossetti, MD;  Location: [facility];  Service: Gynecology;  Laterality: N/A;    FAMILY HISTORY: family history includes Cancer in her mother; Heart attack in her brother; Hypertension in her father and sister.  SOCIAL HISTORY:  reports that she has never smoked. She has never used smokeless tobacco. She reports that she does not drink alcohol or use illicit drugs.  REVIEW OF SYSTEMS:  Other than that discussed above is noncontributory.  PHYSICAL EXAMINATION: ECOG PERFORMANCE STATUS: 1 - Symptomatic but completely ambulatory  There were no vitals taken for this visit.  GENERAL:alert, no distress and comfortable.. Alopecia resolving. SKIN: skin color, texture, turgor are normal, no rashes or significant lesions EYES: PERLA; Conjunctiva are pink and non-injected, sclera clear OROPHARYNX:no exudate, no erythema on lips, buccal mucosa, or tongue. NECK: supple, thyroid normal size, non-tender, without nodularity. No masses CHEST: Normal AP diameter with light port in place. LYMPH:  no palpable lymphadenopathy in the cervical, axillary or inguinal LUNGS: clear to auscultation and percussion with normal breathing effort HEART: regular rate & rhythm and no murmurs. ABDOMEN:abdomen soft, non-tender and normal bowel sounds. No dose or megaly, ascites, or CVA tenderness. MUSCULOSKELETAL:no cyanosis of digits and no clubbing.   Right knee with decreased range of motion on internal rotation. NEURO: alert & oriented x 3 with fluent speech, no focal motor/sensory deficits   LABORATORY DATA: Infusion on 05/06/2013  Component Date Value Ref Range Status  . WBC 05/06/2013 7.5  4.0 - 10.5 K/uL Final  . RBC 05/06/2013 4.73  3.87 - 5.11 MIL/uL Final  . Hemoglobin 05/06/2013 13.8  12.0 - 15.0 g/dL Final  . HCT 05/06/2013  42.2  36.0 - 46.0 % Final  . MCV 05/06/2013 89.2  78.0 - 100.0 fL Final  . MCH 05/06/2013 29.2  26.0 - 34.0 pg Final  . MCHC 05/06/2013 32.7  30.0 - 36.0 g/dL Final  . RDW 05/06/2013 13.9  11.5 - 15.5 % Final  . Platelets 05/06/2013 279  150 - 400 K/uL Final  . Neutrophils Relative % 05/06/2013 53  43 - 77 % Final  . Neutro Abs 05/06/2013 4.0  1.7 - 7.7 K/uL Final  . Lymphocytes Relative 05/06/2013 36  12 - 46 % Final  . Lymphs Abs 05/06/2013 2.7  0.7 - 4.0 K/uL Final  . Monocytes Relative 05/06/2013 8  3 - 12 % Final  . Monocytes Absolute 05/06/2013 0.6  0.1 - 1.0 K/uL Final  . Eosinophils Relative 05/06/2013 3  0 - 5 % Final  . Eosinophils Absolute 05/06/2013 0.2  0.0 - 0.7 K/uL Final  . Basophils Relative 05/06/2013 0  0 - 1 % Final  . Basophils Absolute 05/06/2013 0.0  0.0 - 0.1 K/uL Final  . Sodium 05/06/2013 140  137 - 147 mEq/L Final  . Potassium 05/06/2013 4.2  3.7 - 5.3 mEq/L Final  . Chloride 05/06/2013 103  96 - 112 mEq/L Final  . CO2 05/06/2013 25  19 - 32 mEq/L Final  .  Glucose, Bld 05/06/2013 90  70 - 99 mg/dL Final  . BUN 05/06/2013 14  6 - 23 mg/dL Final  . Creatinine, Ser 05/06/2013 0.79  0.50 - 1.10 mg/dL Final  . Calcium 05/06/2013 9.6  8.4 - 10.5 mg/dL Final  . Total Protein 05/06/2013 7.2  6.0 - 8.3 g/dL Final  . Albumin 05/06/2013 3.7  3.5 - 5.2 g/dL Final  . AST 05/06/2013 24  0 - 37 U/L Final  . ALT 05/06/2013 27  0 - 35 U/L Final  . Alkaline Phosphatase 05/06/2013 95  39 - 117 U/L Final  . Total Bilirubin 05/06/2013 0.4  0.3 - 1.2 mg/dL Final  . GFR calc non Af Amer 05/06/2013 81* >90 mL/min Final  . GFR calc Af Amer 05/06/2013 >90  >90 mL/min Final   Comment: (NOTE)                          The eGFR has been calculated using the CKD EPI equation.                          This calculation has not been validated in all clinical situations.                          eGFR's persistently <90 mL/min signify possible Chronic Kidney                           Disease.  Marland Kitchen Specific Gravity, Urine 05/06/2013 >1.030* 1.005 - 1.030 Final  . pH 05/06/2013 5.5  5.0 - 8.0 Final  . Glucose, UA 05/06/2013 NEGATIVE  NEGATIVE mg/dL Final  . Hgb urine dipstick 05/06/2013 NEGATIVE  NEGATIVE Final  . Bilirubin Urine 05/06/2013 NEGATIVE  NEGATIVE Final  . Ketones, ur 05/06/2013 NEGATIVE  NEGATIVE mg/dL Final  . Protein, ur 05/06/2013 NEGATIVE  NEGATIVE mg/dL Final  . Urobilinogen, UA 05/06/2013 0.2  0.0 - 1.0 mg/dL Final  . Nitrite 05/06/2013 NEGATIVE  NEGATIVE Final  . Leukocytes, UA 05/06/2013 TRACE* NEGATIVE Final  Infusion on 04/11/2013  Component Date Value Ref Range Status  . WBC 04/11/2013 6.2  4.0 - 10.5 K/uL Final  . RBC 04/11/2013 4.58  3.87 - 5.11 MIL/uL Final  . Hemoglobin 04/11/2013 13.4  12.0 - 15.0 g/dL Final  . HCT 04/11/2013 41.5  36.0 - 46.0 % Final  . MCV 04/11/2013 90.6  78.0 - 100.0 fL Final  . MCH 04/11/2013 29.3  26.0 - 34.0 pg Final  . MCHC 04/11/2013 32.3  30.0 - 36.0 g/dL Final  . RDW 04/11/2013 14.8  11.5 - 15.5 % Final  . Platelets 04/11/2013 258  150 - 400 K/uL Final  . Neutrophils Relative % 04/11/2013 54  43 - 77 % Final  . Neutro Abs 04/11/2013 3.4  1.7 - 7.7 K/uL Final  . Lymphocytes Relative 04/11/2013 33  12 - 46 % Final  . Lymphs Abs 04/11/2013 2.1  0.7 - 4.0 K/uL Final  . Monocytes Relative 04/11/2013 8  3 - 12 % Final  . Monocytes Absolute 04/11/2013 0.5  0.1 - 1.0 K/uL Final  . Eosinophils Relative 04/11/2013 4  0 - 5 % Final  . Eosinophils Absolute 04/11/2013 0.2  0.0 - 0.7 K/uL Final  . Basophils Relative 04/11/2013 0  0 - 1 % Final  . Basophils Absolute 04/11/2013 0.0  0.0 - 0.1 K/uL Final  .  Sodium 04/11/2013 140  137 - 147 mEq/L Final  . Potassium 04/11/2013 4.4  3.7 - 5.3 mEq/L Final  . Chloride 04/11/2013 103  96 - 112 mEq/L Final  . CO2 04/11/2013 26  19 - 32 mEq/L Final  . Glucose, Bld 04/11/2013 114* 70 - 99 mg/dL Final  . BUN 04/11/2013 18  6 - 23 mg/dL Final  . Creatinine, Ser 04/11/2013 0.73  0.50  - 1.10 mg/dL Final  . Calcium 04/11/2013 9.7  8.4 - 10.5 mg/dL Final  . Total Protein 04/11/2013 7.4  6.0 - 8.3 g/dL Final  . Albumin 04/11/2013 3.8  3.5 - 5.2 g/dL Final  . AST 04/11/2013 21  0 - 37 U/L Final  . ALT 04/11/2013 24  0 - 35 U/L Final  . Alkaline Phosphatase 04/11/2013 102  39 - 117 U/L Final  . Total Bilirubin 04/11/2013 0.4  0.3 - 1.2 mg/dL Final  . GFR calc non Af Amer 04/11/2013 83* >90 mL/min Final  . GFR calc Af Amer 04/11/2013 >90  >90 mL/min Final   Comment: (NOTE)                          The eGFR has been calculated using the CKD EPI equation.                          This calculation has not been validated in all clinical situations.                          eGFR's persistently <90 mL/min signify possible Chronic Kidney                          Disease.  . CA 125 04/11/2013 12.7  0.0 - 30.2 U/mL Final   Performed at [facility]  . Specific Gravity, Urine 04/11/2013 1.020  1.005 - 1.030 Final  . pH 04/11/2013 6.0  5.0 - 8.0 Final  . Glucose, UA 04/11/2013 NEGATIVE  NEGATIVE mg/dL Final  . Hgb urine dipstick 04/11/2013 NEGATIVE  NEGATIVE Final  . Bilirubin Urine 04/11/2013 NEGATIVE  NEGATIVE Final  . Ketones, ur 04/11/2013 NEGATIVE  NEGATIVE mg/dL Final  . Protein, ur 04/11/2013 NEGATIVE  NEGATIVE mg/dL Final  . Urobilinogen, UA 04/11/2013 0.2  0.0 - 1.0 mg/dL Final  . Nitrite 04/11/2013 NEGATIVE  NEGATIVE Final  . Leukocytes, UA 04/11/2013 SMALL* NEGATIVE Final    PATHOLOGY: No new pathology.  Urinalysis    Component Value Date/Time   COLORURINE YELLOW 07/03/2012 1158   APPEARANCEUR CLEAR 07/03/2012 1158   LABSPEC >1.030* 05/06/2013 1145   PHURINE 5.5 05/06/2013 1145   GLUCOSEU NEGATIVE 05/06/2013 1145   HGBUR NEGATIVE 05/06/2013 1145   BILIRUBINUR NEGATIVE 05/06/2013 1145   KETONESUR NEGATIVE 05/06/2013 1145   PROTEINUR NEGATIVE 05/06/2013 1145   UROBILINOGEN 0.2 05/06/2013 1145   NITRITE NEGATIVE 05/06/2013 1145   LEUKOCYTESUR TRACE* 05/06/2013 1145     RADIOGRAPHIC STUDIES: NM PET Image Initial (PI) Skull Base To Thigh Status: Final result         PACS Images    Show images for NM PET Image Initial (PI) Skull Base To Thigh         Study Result    CLINICAL DATA: Subsequent treatment strategy for stage IIIC ovarian  cancer. Patient underwent debulking on 06/05/2012. Repeat surgery on  10/30/2012.  EXAM:  NUCLEAR  MEDICINE PET SKULL BASE TO THIGH  FASTING BLOOD GLUCOSE: Value: 155m/dl  TECHNIQUE:  18.5 mCi F-18 FDG was injected intravenously. CT data was obtained  and used for attenuation correction and anatomic localization only.  (This was not acquired as a diagnostic CT examination.) Additional  exam technical data entered on technologist worksheet.  COMPARISON: CT abdomen and pelvis from 10/01/2012.  FINDINGS:  NECK  No hypermetabolic lymph nodes in the neck.  CHEST  No hypermetabolic mediastinal or hilar nodes. No suspicious  pulmonary nodules on the CT scan.  The left-sided Port-A-Cath tip is in the left innominate vein, just  proximal to the innominate vein confluence. .  ABDOMEN/PELVIS  No abnormal hypermetabolic activity within the liver, pancreas,  adrenal glands, or spleen. No hypermetabolic lymph nodes in the  abdomen or pelvis. 13 x 7 mm nodule in the gastrohepatic ligament  was 18 x 7 mm previously. This shows no hypermetabolism on the PET  images.  The omental nodularity seen previously is no longer evident. No  abnormal FDG accumulation within the abdomen on today's PET images.  Tiny soft tissue nodules just caudal to the lateral segment left  liver are stable and show no hypermetabolism on the PET images.  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  SKELETON  No focal hypermetabolic activity to suggest skeletal  metastasis.  There is low level diffuse and relatively homogeneous FDG uptake  within the marrow space, likely related to treatment mediated marrow  stimulation.  IMPRESSION:  No concerning focal areas of hypermetabolism in the neck, chest,  abdomen, or pelvis on today's study. The abnormal soft tissue  identified within the omentum on the previous study is no longer  evident after surgery. Small lymph nodes or nodular areas of soft  tissue in the gastrohepatic ligament and just inferior to the left  liver are unchanged and show no hypermetabolism on today's PET  images.  The left-sided Port-A-Cath tip is in the left innominate vein.  Electronically Signed  By: EMisty StanleyM.D.  On: 03/05/2013 10:32      ASSESSMENT:  #1. Stage IIIc ovarian cancer, no evidence of disease, to begin maintenance therapy with Avastin today.  #2. Peripheral neuropathy from previous chemotherapy, improved on Cymbalta, improved. #3. History of allergy to carboplatin and with the emergence of neuropathy, VP-16 was chosen as treatment after debulking surgery preceded by 6 cycles of carboplatin and Taxol.  #4. Hypertension, controlled. #5. Increased right knee pain.    PLAN:  #1. Intravenous Avastin at 15 mg per kilogram. #2. X-ray right knee. If negative, MRI will be ordered to rule out meniscal tear. #3. Followup in 3 weeks with office visit and Avastin.   All questions were answered. The patient knows to call the clinic with any problems, questions or concerns. We can certainly see the patient much sooner if necessary.   I spent 25 minutes counseling the patient face to face. The total time spent in the appointment was 30 minutes.    FDoroteo Bradford MD 05/06/2013 2:35 PM

## 2013-05-06 NOTE — Patient Instructions (Signed)
[facility] Discharge Instructions for Patients Receiving Chemotherapy  Today you received the following chemotherapy agents Avastin. We will continue with the current regimen of Avastin every 3 weeks. We will give you Avastin as long as your disease doesn't show any progression.  We will plan for CT scans again in April. Stop by Radiology today on your way out for an x-ray of your right knee. If the x-ray is negative we can get you scheduled for a MRI of the knee. Continue all medications as prescribed. Report any issues/concerns to clinic as needed prior to appointments. If you develop nausea and vomiting that is not controlled by your nausea medication, call the clinic. If it is after clinic hours your family physician or the after hours number for the clinic or go to the Emergency Department.   BELOW ARE SYMPTOMS THAT SHOULD BE REPORTED IMMEDIATELY:  *FEVER GREATER THAN 101.0 F  *CHILLS WITH OR WITHOUT FEVER  NAUSEA AND VOMITING THAT IS NOT CONTROLLED WITH YOUR NAUSEA MEDICATION  *UNUSUAL SHORTNESS OF BREATH  *UNUSUAL BRUISING OR BLEEDING  TENDERNESS IN MOUTH AND THROAT WITH OR WITHOUT PRESENCE OF ULCERS  *URINARY PROBLEMS  *BOWEL PROBLEMS  UNUSUAL RASH Items with * indicate a potential emergency and should be followed up as soon as possible.  One of the nurses will contact you 24 hours after your treatment. Please let the nurse know about any problems that you may have experienced. Feel free to call the clinic you have any questions or concerns. The clinic phone number is [phone].   I have been informed and understand all the instructions given to me. I know to contact the clinic, my physician, or go to the Emergency Department if any problems should occur. I do not have any questions at this time, but understand that I may call the clinic during office hours or the Patient Navigator at [phone] should I have any questions or need assistance in  obtaining follow up care.    __________________________________________  _____________  __________ Signature of Patient or Authorized Representative            Date                   Time    __________________________________________ Nurse's Signature

## 2013-05-07 LAB — CA 125: CA 125: 20.1 U/mL (ref 0.0–30.2)

## 2013-05-26 NOTE — Progress Notes (Signed)
Delphina Cahill, MD  [place] [address]  Ovarian cancer  Omental metastasis  CURRENT THERAPY: Avastin maintenance beginning on 03/19/2013  INTERVAL HISTORY: Rita Humphrey 73 y.o. female returns for  regular  visit for followup of peritoneal carcinomatosis, currently on Avastin maintenance     Ovarian cancer   05/15/2012 Initial Diagnosis IIIC ovarian cancer   06/05/2012 Surgery suboptimal debulking   06/07/2012 - 06/07/2012 Chemotherapy Carboplatin/Docetaxel as inpatient at [facility] by Dr. Beryle Beams.  1 cycle.   06/28/2012 - 08/09/2012 Chemotherapy Carboplatin/Paclitaxel x 3 cycles   08/30/2012 - 09/20/2012 Chemotherapy Carboplatin/Abraxane x 2 cycles (due to increasing peripheral neuropathy)   10/30/2012 Surgery exploratory laparotomy, optimal debulking. [facility]   10/31/2012 Remission    12/17/2012 - 02/20/2013 Chemotherapy Etoposide x 4 cycles   03/19/2013 -  Chemotherapy Avastin maintenance   I personally reviewed and went over laboratory results with the patient.  The results are noted within this dictation.  I personally reviewed and went over radiographic studies with the patient.  The results are noted within this dictation.    She notes a left shoulder pain on the superior aspect of shoulder at mid-clavicular line.  It is tender to palpation.  No erythema or obvious abnormality noted on inspection.  Decreased range of motion noted particularly with abduction and extension.  She also notes a right knee pain that has been chronic x 2 months.  Plain films were done and she is noted to have mild tricompartmental osteoarthritic change.  I will refer the patient to Ortho as a result.  She otherwise denies any complaints.  Her port was not returning blood this AM, but infusion of fluids was easily performed without resistance.  Alteplase was inserted into port to hopefully interrupt fibrin sheath.  I reviewed her UA with the patient and  on questioning, she notes bad odor of urine, but denies frequency, urgency, and pain.  Oncologically, she denies any complaints and ROS questioning is negative.   Past Medical History  Diagnosis Date  . Hypertension   . Hyperlipemia   . Thyroid disease   . Asthma   . Adnexal mass 05/16/2012  . 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 Cancer     [place]  . Breast cancer     s/p bilateral mastectomy [id number]  . Ovarian cancer 05/17/2012  . 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 Peripheral vascular disease 2013    hx of DVT right leg  . History of kidney stones   . Anemia   . Platelets decreased april 2014     1 transfusion given before chemo  . Deafness in left ear   . Numbness in feet   . Numbness of fingers of both hands   . Dizziness     occasional  . History of breast cancer 12/07/2012    has Hypokalemia; Abdominal pain; HTN (hypertension); Hyperlipemia; Low back pain; Neck pain; Ascites; Adnexal mass; Unspecified constipation; Thrombocytosis; Ovarian cancer; Omental metastasis; Nausea with vomiting; Peripheral neuropathy, secondary to chemotherapy; and History of breast cancer on her problem list.     is allergic to gabapentin; penicillins; carboplatin; and morphine and related.  Ms. Garbett does not currently have medications on file.  Past Surgical History  Procedure Laterality Date  . Thyroid lobectomy    . Foot surgery      2 spurs taken out of foot  .  Varicose vein surgery    . Bladder tack surgery     . Tonsillectomy    . Mastectomy      bilateral   . Laparotomy Bilateral 06/05/2012    Procedure: EXPLORATORY LAPAROTOMY WITH PELVIC TUMOR BIOPSY;  Surgeon: Imagene Gurney A. Alycia Rossetti, MD;  Location: [facility];  Service: Gynecology;  Laterality: Bilateral;  . Abdominal hysterectomy  1980  . Vascular surgery      veins stripped from both legs  . Portacath placement Left 06/25/2012    Procedure: INSERTION PORT-A-CATH;  Surgeon:  Jamesetta So, MD;  Location: [facility];  Service: General;  Laterality: Left;  . Laparoscopy N/A 10/30/2012    Procedure: LAPAROSCOPY DIAGNOSTIC, EXPLORATORY LAPAROTOMY, BILATERAL SALPINGO-OOPHORECTOMY, TUMOR DEBULKING;  Surgeon: Imagene Gurney A. Alycia Rossetti, MD;  Location: [facility];  Service: Gynecology;  Laterality: N/A;    Denies any headaches, dizziness, double vision, fevers, chills, night sweats, nausea, vomiting, diarrhea, constipation, chest pain, heart palpitations, shortness of breath, blood in stool, black tarry stool, urinary pain, urinary burning, urinary frequency, hematuria.   PHYSICAL EXAMINATION  ECOG PERFORMANCE STATUS: 2 - Symptomatic, <50% confined to bed  There were no vitals filed for this visit.  GENERAL:alert, no distress, well nourished, well developed, comfortable, cooperative, obese and smiling SKIN: skin color, texture, turgor are normal, no rashes or significant lesions HEAD: Normocephalic, No masses, lesions, tenderness or abnormalities EYES: normal, PERRLA, EOMI, Conjunctiva are pink and non-injected EARS: External ears normal OROPHARYNX:mucous membranes are moist  NECK: supple, trachea midline LYMPH:  no palpable lymphadenopathy BREAST:not examined LUNGS: clear to auscultation  HEART: regular rate & rhythm, no murmurs and no gallops ABDOMEN:abdomen soft, non-tender, obese and normal bowel sounds BACK: Back symmetric, no curvature. EXTREMITIES:less then 2 second capillary refill, no skin discoloration, no clubbing, no cyanosis, positive findings:  edema non pitting edema 2+ bilateral LE.  Left shoulder pain with palpation at the superior aspect at mid-clavicular line with decreased ROM with abduction, extension, and internal rotation.   Right knee pain with edema. NEURO: alert & oriented x 3 with fluent speech, no focal motor/sensory deficits   LABORATORY DATA: CBC    Component Value Date/Time   WBC 7.5 05/06/2013 1215   RBC 4.73 05/06/2013 1215   HGB 13.8 05/06/2013 1215     HCT 42.2 05/06/2013 1215   PLT 279 05/06/2013 1215   MCV 89.2 05/06/2013 1215   MCH 29.2 05/06/2013 1215   MCHC 32.7 05/06/2013 1215   RDW 13.9 05/06/2013 1215   LYMPHSABS 2.7 05/06/2013 1215   MONOABS 0.6 05/06/2013 1215   EOSABS 0.2 05/06/2013 1215   BASOSABS 0.0 05/06/2013 1215      Chemistry      Component Value Date/Time   NA 140 05/06/2013 1215   K 4.2 05/06/2013 1215   CL 103 05/06/2013 1215   CO2 25 05/06/2013 1215   BUN 14 05/06/2013 1215   CREATININE 0.79 05/06/2013 1215      Component Value Date/Time   CALCIUM 9.6 05/06/2013 1215   ALKPHOS 95 05/06/2013 1215   AST 24 05/06/2013 1215   ALT 27 05/06/2013 1215   BILITOT 0.4 05/06/2013 1215     Lab Results  Component Value Date   CA125 20.1 05/06/2013   Results for STARNISHA, BATREZ (MRN [id number]) as of 05/26/2013 21:39  Ref. Range 04/11/2013 12:10 05/06/2013 11:45 05/06/2013 12:15  CA 125 Latest Range: 0.0-30.2 U/mL 12.7  20.1      ASSESSMENT:  1. Stage IIIc ovarian cancer, no evidence of disease, to  begin maintenance therapy with Avastin today. 2. Peripheral neuropathy from previous chemotherapy, improved on Cymbalta, improved. 3. History of allergy to carboplatin and with the emergence of neuropathy, VP-16 was chosen as treatment after debulking surgery preceded by 6 cycles of carboplatin and Taxol.  4. Hypertension, controlled. 5. Right knee pain, Xray showing mild tricompartmental osteoarthritic change.  Will refer to Dr. Aline Brochure (Ortho) 6. Left shoulder pain, suspect bursitis 7. Moderate leukocytes in urine, suspect early UTI  Patient Active Problem List   Diagnosis Date Noted  . History of breast cancer 12/07/2012  . 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 Nausea with vomiting 07/09/2012  . Thrombocytosis 05/17/2012  . Ovarian cancer 05/17/2012  . 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 Ascites 05/16/2012  . Adnexal mass 05/16/2012  . Unspecified constipation 05/16/2012  . Low back pain 05/15/2012  . Neck pain 05/15/2012  .  Hypokalemia 05/14/2012  . Abdominal pain 05/14/2012  . HTN (hypertension) 05/14/2012  . Hyperlipemia      PLAN:  1. I personally reviewed and went over laboratory results with the patient.  The results are noted within this dictation. 2. I personally reviewed and went over radiographic studies with the patient.  The results are noted within this dictation.   3. Pre-chemo labs: CBC diff, CMET, UA, CA 125. 4. CT CAP w contrast at end of April.  Order placed.  Needs scheduled.  5. Referral to Dr. Aline Brochure (Ortho) for right knee pain with negative oncology work-up.  6. Encouraged Aleve for left shoulder discomfort.  7. Rx for Macrobid for moderate leukocytes in urine and suspected early UTI.  8. Return in 3 weeks for follow-up.   THERAPY PLAN:  She will continue with Avastin every 3 weeks.  We will monitor her labs and proceed with Avastin.  She will be restaged at the end of April, unless otherwise indicated.   All questions were answered. The patient knows to call the clinic with any problems, questions or concerns. We can certainly see the patient much sooner if necessary.  Patient and plan discussed with Dr. Farrel Gobble and he is in agreement with the aforementioned.   KEFALAS,THOMAS 05/26/2013

## 2013-05-27 ENCOUNTER — Encounter (HOSPITAL_BASED_OUTPATIENT_CLINIC_OR_DEPARTMENT_OTHER): Payer: Medicare HMO | Admitting: Oncology

## 2013-05-27 ENCOUNTER — Encounter (HOSPITAL_BASED_OUTPATIENT_CLINIC_OR_DEPARTMENT_OTHER): Payer: Medicare HMO

## 2013-05-27 VITALS — BP 153/84 | HR 84 | Temp 97.0°F | Resp 20 | Wt 197.6 lb

## 2013-05-27 DIAGNOSIS — Z5112 Encounter for antineoplastic immunotherapy: Secondary | ICD-10-CM

## 2013-05-27 DIAGNOSIS — C786 Secondary malignant neoplasm of retroperitoneum and peritoneum: Secondary | ICD-10-CM

## 2013-05-27 DIAGNOSIS — C569 Malignant neoplasm of unspecified ovary: Secondary | ICD-10-CM

## 2013-05-27 DIAGNOSIS — Z452 Encounter for adjustment and management of vascular access device: Secondary | ICD-10-CM

## 2013-05-27 DIAGNOSIS — M25569 Pain in unspecified knee: Secondary | ICD-10-CM

## 2013-05-27 DIAGNOSIS — M25519 Pain in unspecified shoulder: Secondary | ICD-10-CM

## 2013-05-27 DIAGNOSIS — R82998 Other abnormal findings in urine: Secondary | ICD-10-CM

## 2013-05-27 DIAGNOSIS — G609 Hereditary and idiopathic neuropathy, unspecified: Secondary | ICD-10-CM

## 2013-05-27 LAB — CBC WITH DIFFERENTIAL/PLATELET
BASOS ABS: 0 10*3/uL (ref 0.0–0.1)
Basophils Relative: 0 % (ref 0–1)
EOS ABS: 0.2 10*3/uL (ref 0.0–0.7)
EOS PCT: 2 % (ref 0–5)
HEMATOCRIT: 43.1 % (ref 36.0–46.0)
Hemoglobin: 14.1 g/dL (ref 12.0–15.0)
Lymphocytes Relative: 32 % (ref 12–46)
Lymphs Abs: 2.4 10*3/uL (ref 0.7–4.0)
MCH: 28.7 pg (ref 26.0–34.0)
MCHC: 32.7 g/dL (ref 30.0–36.0)
MCV: 87.8 fL (ref 78.0–100.0)
MONO ABS: 0.7 10*3/uL (ref 0.1–1.0)
Monocytes Relative: 9 % (ref 3–12)
Neutro Abs: 4.2 10*3/uL (ref 1.7–7.7)
Neutrophils Relative %: 57 % (ref 43–77)
Platelets: 234 10*3/uL (ref 150–400)
RBC: 4.91 MIL/uL (ref 3.87–5.11)
RDW: 13.7 % (ref 11.5–15.5)
WBC: 7.4 10*3/uL (ref 4.0–10.5)

## 2013-05-27 LAB — URINALYSIS, DIPSTICK ONLY
GLUCOSE, UA: NEGATIVE mg/dL
Hgb urine dipstick: NEGATIVE
KETONES UR: NEGATIVE mg/dL
NITRITE: NEGATIVE
PH: 5.5 (ref 5.0–8.0)
Specific Gravity, Urine: 1.025 (ref 1.005–1.030)
Urobilinogen, UA: 0.2 mg/dL (ref 0.0–1.0)

## 2013-05-27 LAB — COMPREHENSIVE METABOLIC PANEL
ALT: 26 U/L (ref 0–35)
AST: 23 U/L (ref 0–37)
Albumin: 3.7 g/dL (ref 3.5–5.2)
Alkaline Phosphatase: 99 U/L (ref 39–117)
BUN: 20 mg/dL (ref 6–23)
CO2: 25 mEq/L (ref 19–32)
CREATININE: 0.69 mg/dL (ref 0.50–1.10)
Calcium: 9.9 mg/dL (ref 8.4–10.5)
Chloride: 103 mEq/L (ref 96–112)
GFR calc Af Amer: >90 mL/min (ref >90)
GFR calc non Af Amer: 84 mL/min — ABNORMAL LOW (ref >90)
GLUCOSE: 96 mg/dL (ref 70–99)
Potassium: 4.5 mEq/L (ref 3.7–5.3)
SODIUM: 138 meq/L (ref 137–147)
TOTAL PROTEIN: 7.3 g/dL (ref 6.0–8.3)
Total Bilirubin: 0.3 mg/dL (ref 0.3–1.2)

## 2013-05-27 MED ORDER — ALTEPLASE 2 MG IJ SOLR
2.0000 mg | Freq: Once | INTRAMUSCULAR | Status: AC | PRN
  Administered 2013-05-27: 2 mg
  Filled 2013-05-27: qty 2

## 2013-05-27 MED ORDER — SODIUM CHLORIDE 0.9 % IJ SOLN
10.0000 mL | INTRAMUSCULAR | Status: DC | PRN
  Administered 2013-05-27: 10 mL

## 2013-05-27 MED ORDER — STERILE WATER FOR INJECTION IJ SOLN
INTRAMUSCULAR | Status: AC
  Filled 2013-05-27: qty 10

## 2013-05-27 MED ORDER — BEVACIZUMAB CHEMO INJECTION 400 MG/16ML
15.0000 mg/kg | Freq: Once | INTRAVENOUS | Status: AC
  Administered 2013-05-27: 1325 mg via INTRAVENOUS
  Filled 2013-05-27: qty 48

## 2013-05-27 MED ORDER — SODIUM CHLORIDE 0.9 % IV SOLN
Freq: Once | INTRAVENOUS | Status: AC
  Administered 2013-05-27: 12:00:00 via INTRAVENOUS

## 2013-05-27 MED ORDER — HEPARIN SOD (PORK) LOCK FLUSH 100 UNIT/ML IV SOLN
500.0000 [IU] | Freq: Once | INTRAVENOUS | Status: AC | PRN
  Administered 2013-05-27: 500 [IU]
  Filled 2013-05-27: qty 5

## 2013-05-27 MED ORDER — NITROFURANTOIN MONOHYD MACRO 100 MG PO CAPS: 100.0000 mg | ORAL_CAPSULE | Freq: Two times a day (BID) | ORAL | Status: DC

## 2013-05-27 NOTE — Patient Instructions (Signed)
[facility] Discharge Instructions  RECOMMENDATIONS MADE BY THE CONSULTANT AND ANY TEST RESULTS WILL BE SENT TO YOUR REFERRING PHYSICIAN.  EXAM FINDINGS BY THE PHYSICIAN TODAY AND SIGNS OR SYMPTOMS TO REPORT TO CLINIC OR PRIMARY PHYSICIAN: Exam and findings as discussed by Robynn Pane, PA-C. You are doing well.  Report unexplained weight loss or weight gain, shortness of breath, etc.  Will make a referral to Dr. Aline Brochure regarding your knee pain.  Will continue Avastin every 3 weeks.  MEDICATIONS PRESCRIBED:  none  INSTRUCTIONS/FOLLOW-UP: Blood work and Avastin every 3 weeks, to see PA in 3 weeks, scans in April and to see MD in 6 weeks.  Thank you for choosing [facility] to provide your oncology and hematology care.  To afford each patient quality time with our providers, please arrive at least 15 minutes before your scheduled appointment time.  With your help, our goal is to use those 15 minutes to complete the necessary work-up to ensure our physicians have the information they need to help with your evaluation and healthcare recommendations.    Effective January 1st, 2014, we ask that you re-schedule your appointment with our physicians should you arrive 10 or more minutes late for your appointment.  We strive to give you quality time with our providers, and arriving late affects you and other patients whose appointments are after yours.    Again, thank you for choosing [facility].  Our hope is that these requests will decrease the amount of time that you wait before being seen by our physicians.       _____________________________________________________________  Should you have questions after your visit to [facility], please contact our office at [phone] between the hours of 8:30 a.m. and 5:00 p.m.  Voicemails left after 4:30 p.m. will not be returned until the following business day.  For prescription refill  requests, have your pharmacy contact our office with your prescription refill request.

## 2013-05-27 NOTE — Progress Notes (Signed)
[place] accessed with 20g huber needle.  No blood return noted.  Pt denies stinging, burning at or around site when flushed.  No obvious s/s of infiltration. Pt is agreeable to trying alteplase.   1050 - Alteplase given.  1200 - 7cc of alteplase and blood return when withdrawing from port. Good blood return present, labs drawn from site.  Flushed with NS 25ml.  NS 522ml infusing per pump KVO.

## 2013-05-28 LAB — CA 125: CA 125: 39.5 U/mL — ABNORMAL HIGH (ref 0.0–30.2)

## 2013-06-16 NOTE — Progress Notes (Signed)
rescheduled

## 2013-06-17 ENCOUNTER — Ambulatory Visit (HOSPITAL_COMMUNITY): Payer: Medicare Other | Admitting: Oncology

## 2013-06-19 IMAGING — CR DG CHEST 1V PORT
1 series · 1 of 1 positions shown · non-contrast
Comparison: 06/01/2012

CLINICAL DATA: Port placement

PORTABLE CHEST - 1 VIEW

[view not recorded]
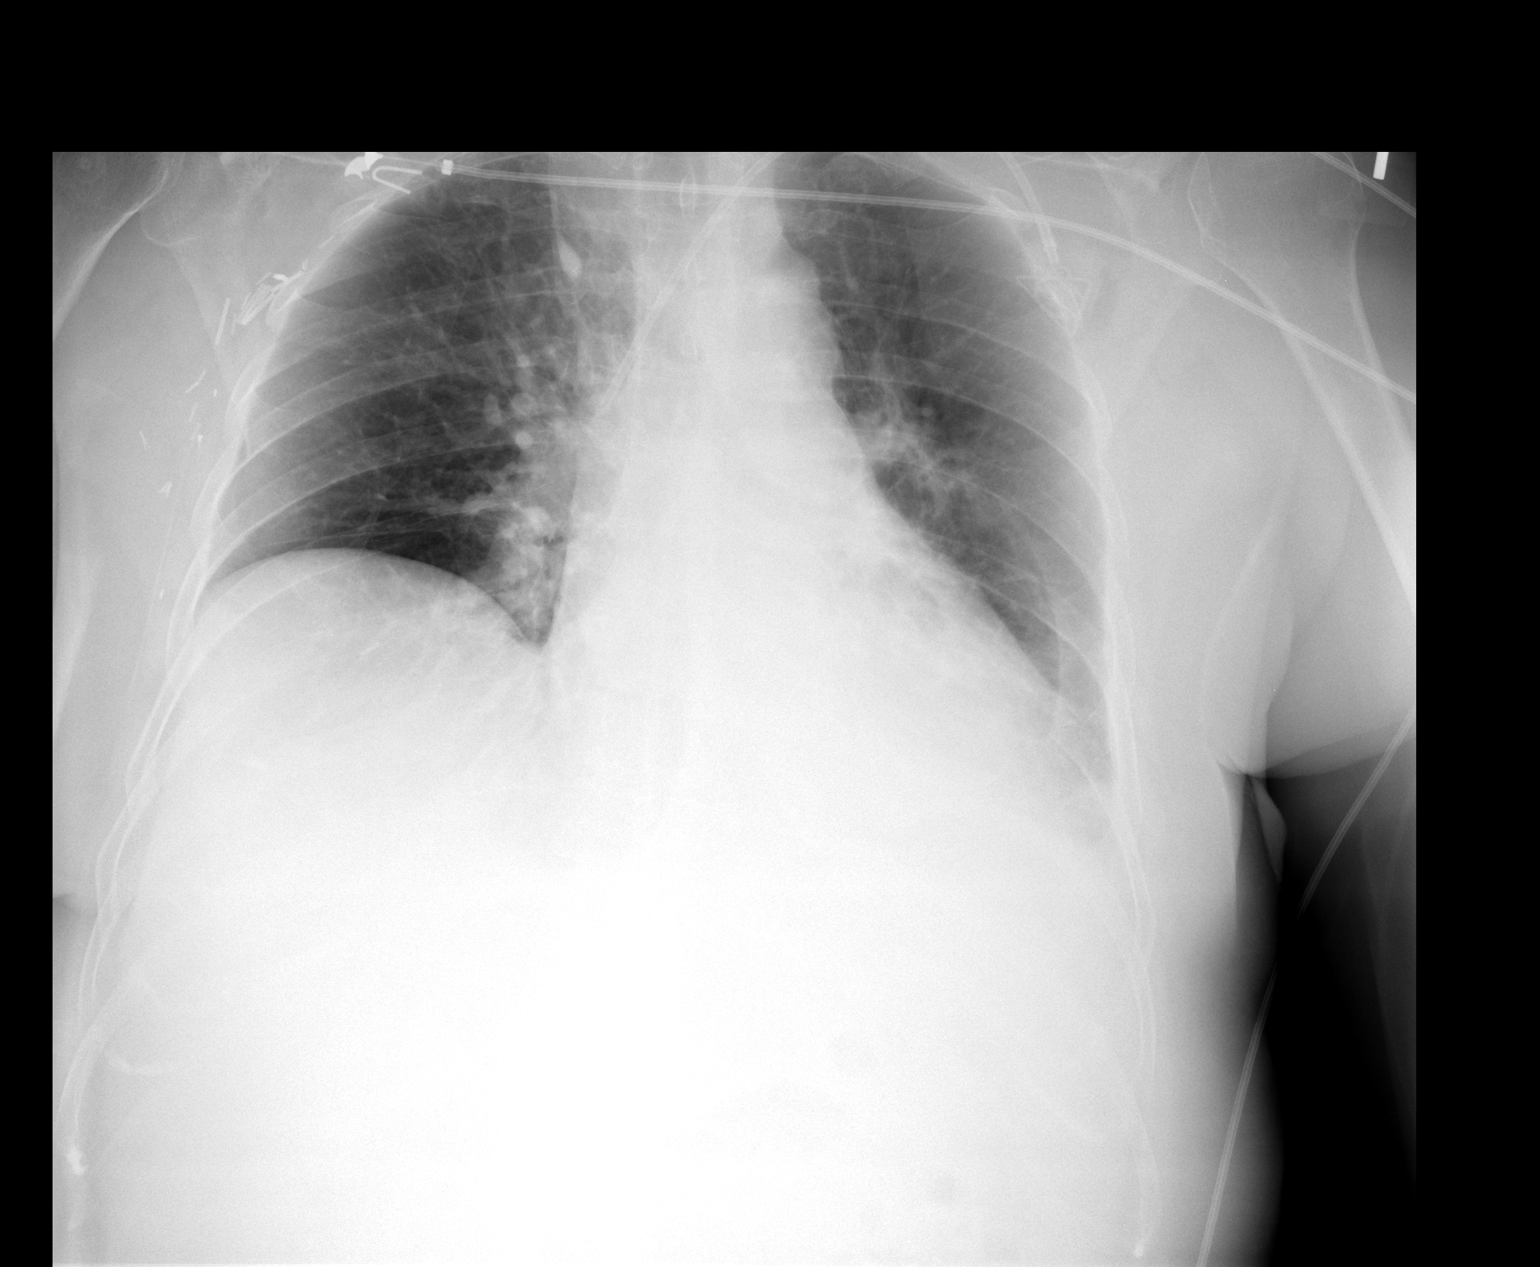

[1 of 1 positions shown; findings below may reference images not displayed]

FINDINGS: Artifact overlies the chest.  Power port has been placed
on the left from a subclavian approach.  Catheter tip is in the SVC
3 cm above the right atrium.  No pneumothorax.  Pulmonary scarring
appears the same.  Surgical clips right axilla.
IMPRESSION: Left-sided power port well positioned.  No complication evident.
Tip in the SVC 3 cm above the right atrium.

## 2013-06-24 ENCOUNTER — Encounter (HOSPITAL_COMMUNITY): Payer: Medicare HMO | Attending: Oncology

## 2013-06-24 VITALS — BP 133/77 | HR 87 | Temp 97.0°F | Resp 18 | Wt 192.4 lb

## 2013-06-24 DIAGNOSIS — C786 Secondary malignant neoplasm of retroperitoneum and peritoneum: Secondary | ICD-10-CM

## 2013-06-24 DIAGNOSIS — C569 Malignant neoplasm of unspecified ovary: Secondary | ICD-10-CM | POA: Insufficient documentation

## 2013-06-24 DIAGNOSIS — Z5112 Encounter for antineoplastic immunotherapy: Secondary | ICD-10-CM

## 2013-06-24 LAB — COMPREHENSIVE METABOLIC PANEL
ALT: 20 U/L (ref 0–35)
AST: 19 U/L (ref 0–37)
Albumin: 3.7 g/dL (ref 3.5–5.2)
Alkaline Phosphatase: 108 U/L (ref 39–117)
BUN: 21 mg/dL (ref 6–23)
CO2: 23 mEq/L (ref 19–32)
Calcium: 9.9 mg/dL (ref 8.4–10.5)
Chloride: 103 mEq/L (ref 96–112)
Creatinine, Ser: 0.77 mg/dL (ref 0.50–1.10)
GFR calc non Af Amer: 81 mL/min — ABNORMAL LOW (ref >90)
Glucose, Bld: 99 mg/dL (ref 70–99)
Potassium: 4.2 mEq/L (ref 3.7–5.3)
SODIUM: 139 meq/L (ref 137–147)
TOTAL PROTEIN: 7.6 g/dL (ref 6.0–8.3)
Total Bilirubin: 0.4 mg/dL (ref 0.3–1.2)

## 2013-06-24 LAB — CBC WITH DIFFERENTIAL/PLATELET
Basophils Absolute: 0 10*3/uL (ref 0.0–0.1)
Basophils Relative: 0 % (ref 0–1)
EOS ABS: 0.2 10*3/uL (ref 0.0–0.7)
EOS PCT: 3 % (ref 0–5)
HCT: 45 % (ref 36.0–46.0)
Hemoglobin: 15 g/dL (ref 12.0–15.0)
Lymphocytes Relative: 33 % (ref 12–46)
Lymphs Abs: 2.5 10*3/uL (ref 0.7–4.0)
MCH: 28.5 pg (ref 26.0–34.0)
MCHC: 33.3 g/dL (ref 30.0–36.0)
MCV: 85.4 fL (ref 78.0–100.0)
Monocytes Absolute: 0.7 10*3/uL (ref 0.1–1.0)
Monocytes Relative: 9 % (ref 3–12)
Neutro Abs: 4.2 10*3/uL (ref 1.7–7.7)
Neutrophils Relative %: 55 % (ref 43–77)
PLATELETS: 343 10*3/uL (ref 150–400)
RBC: 5.27 MIL/uL — ABNORMAL HIGH (ref 3.87–5.11)
RDW: 14.3 % (ref 11.5–15.5)
WBC: 7.7 10*3/uL (ref 4.0–10.5)

## 2013-06-24 LAB — URINALYSIS, DIPSTICK ONLY
BILIRUBIN URINE: NEGATIVE
Glucose, UA: NEGATIVE mg/dL
HGB URINE DIPSTICK: NEGATIVE
KETONES UR: NEGATIVE mg/dL
Nitrite: NEGATIVE
Specific Gravity, Urine: 1.025 (ref 1.005–1.030)
UROBILINOGEN UA: 0.2 mg/dL (ref 0.0–1.0)
pH: 5.5 (ref 5.0–8.0)

## 2013-06-24 MED ORDER — SODIUM CHLORIDE 0.9 % IV SOLN
Freq: Once | INTRAVENOUS | Status: AC
  Administered 2013-06-24: 11:00:00 via INTRAVENOUS

## 2013-06-24 MED ORDER — HEPARIN SOD (PORK) LOCK FLUSH 100 UNIT/ML IV SOLN
500.0000 [IU] | Freq: Once | INTRAVENOUS | Status: AC | PRN
  Administered 2013-06-24: 500 [IU]
  Filled 2013-06-24: qty 5

## 2013-06-24 MED ORDER — SODIUM CHLORIDE 0.9 % IV SOLN
15.0000 mg/kg | Freq: Once | INTRAVENOUS | Status: AC
  Administered 2013-06-24: 1325 mg via INTRAVENOUS
  Filled 2013-06-24: qty 53

## 2013-06-24 MED ORDER — SODIUM CHLORIDE 0.9 % IJ SOLN
10.0000 mL | INTRAMUSCULAR | Status: DC | PRN
  Administered 2013-06-24: 10 mL

## 2013-06-24 NOTE — Progress Notes (Signed)
Tolerated well

## 2013-06-24 NOTE — Patient Instructions (Signed)
[facility] Discharge Instructions for Patients Receiving Chemotherapy  Today you received the following chemotherapy agents Avastin. Keep appointment for CT scan April 30th. MD appointment  May 4th. Scheduled for Avastin again April 11th. Report any issues/concerns to clinic as needed prior to appointment. If you develop nausea and vomiting that is not controlled by your nausea medication, call the clinic. If it is after clinic hours your family physician or the after hours number for the clinic or go to the Emergency Department.   BELOW ARE SYMPTOMS THAT SHOULD BE REPORTED IMMEDIATELY:  *FEVER GREATER THAN 101.0 F  *CHILLS WITH OR WITHOUT FEVER  NAUSEA AND VOMITING THAT IS NOT CONTROLLED WITH YOUR NAUSEA MEDICATION  *UNUSUAL SHORTNESS OF BREATH  *UNUSUAL BRUISING OR BLEEDING  TENDERNESS IN MOUTH AND THROAT WITH OR WITHOUT PRESENCE OF ULCERS  *URINARY PROBLEMS  *BOWEL PROBLEMS  UNUSUAL RASH Items with * indicate a potential emergency and should be followed up as soon as possible.  One of the nurses will contact you 24 hours after your treatment. Please let the nurse know about any problems that you may have experienced. Feel free to call the clinic you have any questions or concerns. The clinic phone number is [phone].   I have been informed and understand all the instructions given to me. I know to contact the clinic, my physician, or go to the Emergency Department if any problems should occur. I do not have any questions at this time, but understand that I may call the clinic during office hours or the Patient Navigator at [phone] should I have any questions or need assistance in obtaining follow up care.    __________________________________________  _____________  __________ Signature of Patient or Authorized Representative            Date                   Time    __________________________________________ Nurse's Signature

## 2013-06-25 LAB — CA 125: CA 125: 53.3 U/mL — ABNORMAL HIGH (ref 0.0–30.2)

## 2013-06-27 ENCOUNTER — Ambulatory Visit (INDEPENDENT_AMBULATORY_CARE_PROVIDER_SITE_OTHER): Payer: Medicare HMO | Admitting: Orthopedic Surgery

## 2013-06-27 ENCOUNTER — Encounter: Payer: Self-pay | Admitting: Orthopedic Surgery

## 2013-06-27 VITALS — BP 134/84 | Ht 63.0 in | Wt 194.0 lb

## 2013-06-27 DIAGNOSIS — M1710 Unilateral primary osteoarthritis, unspecified knee: Secondary | ICD-10-CM

## 2013-06-27 DIAGNOSIS — M1711 Unilateral primary osteoarthritis, right knee: Secondary | ICD-10-CM | POA: Insufficient documentation

## 2013-06-27 DIAGNOSIS — IMO0002 Reserved for concepts with insufficient information to code with codable children: Secondary | ICD-10-CM

## 2013-06-27 NOTE — Progress Notes (Signed)
Patient ID: Rita Humphrey, female   DOB: 03-Feb-1941, 73 y.o.   MRN: [id number]  Chief Complaint  Patient presents with  . Knee Pain    Right knee pain, no injury    73 year old female with at least grade or stage IIIa ovarian cancer presents with a four-month history of pain swelling catching locking stiffness and giving way of the right knee. Primary care physician Dr. Nevada Crane also followed at the cancer center. Currently in a chemotherapy regimen which she tells me along with her daughter she'll be on for lifetime. Pain is constant it 8/10 she's had no treatments or injections.  Review of systems positive for recent weight loss fever dentures dental issues or throat sinus problems hearing loss ringing of the ears blood clot swelling of the arms and legs shortness of breath cough joint pain limb pain muscle pain leg pain swelling of the limbs glasses cataracts  Dizziness memory problems balance problems numbness tingling. Breast cancer ovarian cancer  Bladder infection painful urination difficulty starting urine blood in urine  Depression anxiety  History of asthma hypertension DVT cancer  Surgery includes right and left mastectomy thyroidectomy varicose veins removed.  Polypharmacy  Allergy to gabapentin penicillin others which I can't read  Family history diabetes lung disease asthma ulcers heart disease hypertension heart attack blood clot  Exam shows a mildly obese white female and pleasant mood oriented x3 appearance overall normal  Upper extremity exam  The right and left upper extremity:   Inspection and palpation revealed no abnormalities in the upper extremities.   Range of motion is full without contracture.  Motor exam is normal with grade 5 strength.  The joints are fully reduced without subluxation.  There is no atrophy or tremor and muscle tone is normal.  All joints are stable.   Left knee flexion 130 stable normal motor exam skin intact no swelling or  tenderness over the knee joint, tenderness over the bursa medially normal neurovascular exam  Right knee painful crepitance limited to 95 flexion stable but painful tender medial and lateral motor exam normal skin intact  Note bursal tenderness. Medial joint line is tender. Cardiovascular and sensory exam normal  Lymph nodes negative  No balance issues and reflexes are equal and 2+  X-rays show mild to moderate arthritis of the right knee I wouldn't say she needed surgery right now.  In any event her and chemotherapy prevents any surgery being done. I gave her brace injected her knee she can have an injection every 3 months  Knee  Injection Procedure Note  Pre-operative Diagnosis: right knee oa  Post-operative Diagnosis: same  Indications: pain  Anesthesia: ethyl chloride   Procedure Details   Verbal consent was obtained for the procedure. Time out was completed.The joint was prepped with alcohol, followed by  Ethyl chloride spray and A 20 gauge needle was inserted into the knee via lateral approach; 54ml 1% lidocaine and 1 ml of depomedrol  was then injected into the joint . The needle was removed and the area cleansed and dressed.  Complications:  None; patient tolerated the procedure well.

## 2013-06-27 NOTE — Patient Instructions (Signed)
You have received a steroid shot. 15% of patients experience increased pain at the injection site with in the next 24 hours. This is best treated with ice and tylenol extra strength 2 tabs every 8 hours. If you are still having pain please call the office.   Use the walker

## 2013-07-04 ENCOUNTER — Ambulatory Visit (HOSPITAL_COMMUNITY)
Admission: RE | Admit: 2013-07-04 | Discharge: 2013-07-04 | Disposition: A | Discharge status: Home / Self Care | Payer: Medicare HMO | Source: Ambulatory Visit | Attending: Hematology and Oncology | Admitting: Hematology and Oncology

## 2013-07-04 ENCOUNTER — Encounter (HOSPITAL_COMMUNITY): Payer: Self-pay

## 2013-07-04 DIAGNOSIS — J45909 Unspecified asthma, uncomplicated: Secondary | ICD-10-CM | POA: Insufficient documentation

## 2013-07-04 DIAGNOSIS — C786 Secondary malignant neoplasm of retroperitoneum and peritoneum: Secondary | ICD-10-CM | POA: Insufficient documentation

## 2013-07-04 DIAGNOSIS — D3000 Benign neoplasm of unspecified kidney: Secondary | ICD-10-CM | POA: Insufficient documentation

## 2013-07-04 DIAGNOSIS — C569 Malignant neoplasm of unspecified ovary: Secondary | ICD-10-CM

## 2013-07-04 DIAGNOSIS — R918 Other nonspecific abnormal finding of lung field: Secondary | ICD-10-CM | POA: Insufficient documentation

## 2013-07-04 DIAGNOSIS — E785 Hyperlipidemia, unspecified: Secondary | ICD-10-CM | POA: Insufficient documentation

## 2013-07-04 DIAGNOSIS — I10 Essential (primary) hypertension: Secondary | ICD-10-CM | POA: Insufficient documentation

## 2013-07-04 DIAGNOSIS — N281 Cyst of kidney, acquired: Secondary | ICD-10-CM | POA: Insufficient documentation

## 2013-07-04 DIAGNOSIS — Z853 Personal history of malignant neoplasm of breast: Secondary | ICD-10-CM | POA: Insufficient documentation

## 2013-07-04 DIAGNOSIS — K439 Ventral hernia without obstruction or gangrene: Secondary | ICD-10-CM | POA: Insufficient documentation

## 2013-07-04 DIAGNOSIS — Z9010 Acquired absence of unspecified breast and nipple: Secondary | ICD-10-CM | POA: Insufficient documentation

## 2013-07-04 MED ORDER — IOHEXOL 300 MG/ML  SOLN
100.0000 mL | Freq: Once | INTRAMUSCULAR | Status: AC | PRN
  Administered 2013-07-04: 100 mL via INTRAVENOUS

## 2013-07-08 ENCOUNTER — Encounter (HOSPITAL_COMMUNITY): Payer: Medicare HMO | Attending: Oncology

## 2013-07-08 ENCOUNTER — Encounter (HOSPITAL_COMMUNITY): Payer: Self-pay

## 2013-07-08 VITALS — BP 138/78 | HR 88 | Temp 97.3°F | Resp 20 | Wt 192.0 lb

## 2013-07-08 DIAGNOSIS — C786 Secondary malignant neoplasm of retroperitoneum and peritoneum: Secondary | ICD-10-CM | POA: Insufficient documentation

## 2013-07-08 DIAGNOSIS — G620 Drug-induced polyneuropathy: Secondary | ICD-10-CM

## 2013-07-08 DIAGNOSIS — E876 Hypokalemia: Secondary | ICD-10-CM | POA: Insufficient documentation

## 2013-07-08 DIAGNOSIS — M25569 Pain in unspecified knee: Secondary | ICD-10-CM

## 2013-07-08 DIAGNOSIS — C569 Malignant neoplasm of unspecified ovary: Secondary | ICD-10-CM | POA: Insufficient documentation

## 2013-07-08 DIAGNOSIS — I10 Essential (primary) hypertension: Secondary | ICD-10-CM

## 2013-07-08 MED ORDER — POTASSIUM CHLORIDE CRYS ER 20 MEQ PO TBCR: 20.0000 meq | EXTENDED_RELEASE_TABLET | Freq: Every day | ORAL | Status: DC

## 2013-07-08 NOTE — Progress Notes (Signed)
[place]  OFFICE PROGRESS NOTE  [place], [facility], [place]  [place] [address]  DIAGNOSIS: No diagnosis found.  Chief Complaint  Patient presents with  . Ovarian Cancer    CURRENT THERAPY: Maintenance Avastin starting on 03/19/2013  INTERVAL HISTORY: Rita Humphrey 73 y.o. female returns for followup while receiving maintenance Avastin for peritoneal carcinomatosis with treatment started on 03/19/2013 after having received carboplatin/docetaxel, carboplatin/paclitaxel, and carboplatin/Abraxane followed by 4 cycles of etoposide albeit after debulking surgery on 06/05/2012. He was started on Avastin maintenance on 03/19/2013. She continues to do well with excellent appetite. She denies any nausea, vomiting, diarrhea, constipation, incontinence, vaginal bleeding or discharge, cough, wheezing, or sore throat. She did have increased sinus congestion a few weeks ago which has dissipated. She does have lower extremity swelling which dissipates overnight. She denies any fever, night sweats, skin rash, headache, or seizures.  MEDICAL HISTORY: Past Medical History  Diagnosis Date  . Hypertension   . Hyperlipemia   . Thyroid disease   . Asthma   . Adnexal mass 05/16/2012  . 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 Cancer     [place]  . Breast cancer     s/p bilateral mastectomy [id number]  . Ovarian cancer 05/17/2012  . 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 Peripheral vascular disease 2013    hx of DVT right leg  . History of kidney stones   . Anemia   . Platelets decreased april 2014     1 transfusion given before chemo  . Deafness in left ear   . Numbness in feet   . Numbness of fingers of both hands   . Dizziness     occasional  . History of breast cancer 12/07/2012    INTERIM HISTORY: has HTN (hypertension); Hyperlipemia; Low back pain; Neck pain; Adnexal mass; Ovarian cancer;  Omental metastasis; Peripheral neuropathy, secondary to chemotherapy; History of breast cancer; and Osteoarthritis of right knee on her problem list.     Ovarian cancer    05/15/2012  Initial Diagnosis  [facility] ovarian cancer    06/05/2012  Surgery  suboptimal debulking    06/07/2012 - 06/07/2012  Chemotherapy  Carboplatin/Docetaxel as inpatient at [facility] by Dr. Beryle Beams. 1 cycle.    06/28/2012 - 08/09/2012  Chemotherapy  Carboplatin/Paclitaxel x 3 cycles    08/30/2012 - 09/20/2012  Chemotherapy  Carboplatin/Abraxane x 2 cycles (due to increasing peripheral neuropathy)    10/30/2012  Surgery  exploratory laparotomy, optimal debulking. Tumor sent [facility]    10/31/2012  Remission     12/17/2012 - 02/20/2013  Chemotherapy  Etoposide x 4 cycles    03/19/2013 -  Chemotherapy  Avastin maintenance      ALLERGIES:  is allergic to gabapentin; penicillins; carboplatin; and morphine and related.  MEDICATIONS: has a current medication list which includes the following prescription(s): amlodipine, docusate calcium, duloxetine, lidocaine-prilocaine, oxycodone-acetaminophen, potassium chloride sa, benzonatate, vitamin d3, ondansetron, prochlorperazine, temazepam, and tetrahydroz-dextran-peg-povid.  SURGICAL HISTORY:  Past Surgical History  Procedure Laterality Date  . Thyroid lobectomy    . Foot surgery      2 spurs taken out of foot  . Varicose vein surgery    . Bladder tack surgery     . Tonsillectomy    . Mastectomy      bilateral   . Laparotomy Bilateral 06/05/2012    Procedure:  EXPLORATORY LAPAROTOMY WITH PELVIC TUMOR BIOPSY;  Surgeon: Imagene Gurney A. Alycia Rossetti, MD;  Location: [facility];  Service: Gynecology;  Laterality: Bilateral;  . Abdominal hysterectomy  1980  . Vascular surgery      veins stripped from both legs  . Portacath placement Left 06/25/2012    Procedure: INSERTION PORT-A-CATH;  Surgeon: Jamesetta So, MD;  Location: [facility];  Service: General;  Laterality: Left;  .  Laparoscopy N/A 10/30/2012    Procedure: LAPAROSCOPY DIAGNOSTIC, EXPLORATORY LAPAROTOMY, BILATERAL SALPINGO-OOPHORECTOMY, TUMOR DEBULKING;  Surgeon: Imagene Gurney A. Alycia Rossetti, MD;  Location: [facility];  Service: Gynecology;  Laterality: N/A;    FAMILY HISTORY: family history includes Cancer in her mother; Heart attack in her brother; Hypertension in her father and sister.  SOCIAL HISTORY:  reports that she has never smoked. She has never used smokeless tobacco. She reports that she does not drink alcohol or use illicit drugs.  REVIEW OF SYSTEMS:  Other than that discussed above is noncontributory.  PHYSICAL EXAMINATION: ECOG PERFORMANCE STATUS: 1 - Symptomatic but completely ambulatory  Blood pressure 138/78, pulse 88, temperature 97.3 F (36.3 C), temperature source Oral, resp. rate 20, weight 192 lb (87.091 kg).  GENERAL:alert, no distress and comfortable SKIN: skin color, texture, turgor are normal, no rashes or significant lesions EYES: PERLA; Conjunctiva are pink and non-injected, sclera clear SINUSES: No redness or tenderness over maxillary or ethmoid sinuses OROPHARYNX:no exudate, no erythema on lips, buccal mucosa, or tongue. NECK: supple, thyroid normal size, non-tender, without nodularity. No masses CHEST: Normal AP diameter with light port in place. No breast masses.. No free fluid wave or shifting dullness. Liver and spleen not palpable. LYMPH:  no palpable lymphadenopathy in the cervical, axillary or inguinal LUNGS: clear to auscultation and percussion with normal breathing effort HEART: regular rate & rhythm and no murmurs. ABDOMEN:abdomen soft, non-tender and normal bowel sounds MUSCULOSKELETAL:no cyanosis of digits and no clubbing. Range of motion normal.  NEURO: alert & oriented x 3 with fluent speech, no focal motor/sensory deficits.   LABORATORY DATA: Infusion on 06/24/2013  Component Date Value Ref Range Status  . Specific Gravity, Urine 06/24/2013 1.025  1.005 - 1.030 Final    . pH 06/24/2013 5.5  5.0 - 8.0 Final  . Glucose, UA 06/24/2013 NEGATIVE  NEGATIVE mg/dL Final  . Hgb urine dipstick 06/24/2013 NEGATIVE  NEGATIVE Final  . Bilirubin Urine 06/24/2013 NEGATIVE  NEGATIVE Final  . Ketones, ur 06/24/2013 NEGATIVE  NEGATIVE mg/dL Final  . Protein, ur 06/24/2013 TRACE* NEGATIVE mg/dL Final  . Urobilinogen, UA 06/24/2013 0.2  0.0 - 1.0 mg/dL Final  . Nitrite 06/24/2013 NEGATIVE  NEGATIVE Final  . Leukocytes, UA 06/24/2013 SMALL* NEGATIVE Final  . WBC 06/24/2013 7.7  4.0 - 10.5 K/uL Final  . RBC 06/24/2013 5.27* 3.87 - 5.11 MIL/uL Final  . Hemoglobin 06/24/2013 15.0  12.0 - 15.0 g/dL Final  . HCT 06/24/2013 45.0  36.0 - 46.0 % Final  . MCV 06/24/2013 85.4  78.0 - 100.0 fL Final  . MCH 06/24/2013 28.5  26.0 - 34.0 pg Final  . MCHC 06/24/2013 33.3  30.0 - 36.0 g/dL Final  . RDW 06/24/2013 14.3  11.5 - 15.5 % Final  . Platelets 06/24/2013 343  150 - 400 K/uL Final  . Neutrophils Relative % 06/24/2013 55  43 - 77 % Final  . Neutro Abs 06/24/2013 4.2  1.7 - 7.7 K/uL Final  . Lymphocytes Relative 06/24/2013 33  12 - 46 % Final  . Lymphs Abs 06/24/2013 2.5  0.7 -  4.0 K/uL Final  . Monocytes Relative 06/24/2013 9  3 - 12 % Final  . Monocytes Absolute 06/24/2013 0.7  0.1 - 1.0 K/uL Final  . Eosinophils Relative 06/24/2013 3  0 - 5 % Final  . Eosinophils Absolute 06/24/2013 0.2  0.0 - 0.7 K/uL Final  . Basophils Relative 06/24/2013 0  0 - 1 % Final  . Basophils Absolute 06/24/2013 0.0  0.0 - 0.1 K/uL Final  . Sodium 06/24/2013 139  137 - 147 mEq/L Final  . Potassium 06/24/2013 4.2  3.7 - 5.3 mEq/L Final  . Chloride 06/24/2013 103  96 - 112 mEq/L Final  . CO2 06/24/2013 23  19 - 32 mEq/L Final  . Glucose, Bld 06/24/2013 99  70 - 99 mg/dL Final  . BUN 06/24/2013 21  6 - 23 mg/dL Final  . Creatinine, Ser 06/24/2013 0.77  0.50 - 1.10 mg/dL Final  . Calcium 06/24/2013 9.9  8.4 - 10.5 mg/dL Final  . Total Protein 06/24/2013 7.6  6.0 - 8.3 g/dL Final  . Albumin  06/24/2013 3.7  3.5 - 5.2 g/dL Final  . AST 06/24/2013 19  0 - 37 U/L Final  . ALT 06/24/2013 20  0 - 35 U/L Final  . Alkaline Phosphatase 06/24/2013 108  39 - 117 U/L Final  . Total Bilirubin 06/24/2013 0.4  0.3 - 1.2 mg/dL Final  . GFR calc non Af Amer 06/24/2013 81* >90 mL/min Final  . GFR calc Af Amer 06/24/2013 >90  >90 mL/min Final   Comment: (NOTE)                          The eGFR has been calculated using the CKD EPI equation.                          This calculation has not been validated in all clinical situations.                          eGFR's persistently <90 mL/min signify possible Chronic Kidney                          Disease.  . CA 125 06/24/2013 53.3* 0.0 - 30.2 U/mL Final   Performed at [place]: No new pathology.  Urinalysis    Component Value Date/Time   COLORURINE YELLOW 07/03/2012 [place] 07/03/2012 1158   LABSPEC 1.025 06/24/2013 1030   PHURINE 5.5 06/24/2013 1030   GLUCOSEU NEGATIVE 06/24/2013 1030   HGBUR NEGATIVE 06/24/2013 1030   BILIRUBINUR NEGATIVE 06/24/2013 1030   KETONESUR NEGATIVE 06/24/2013 1030   PROTEINUR TRACE* 06/24/2013 1030   UROBILINOGEN 0.2 06/24/2013 1030   NITRITE NEGATIVE 06/24/2013 1030   LEUKOCYTESUR SMALL* 06/24/2013 1030    RADIOGRAPHIC STUDIES: Ct Chest W Contrast  07/04/2013   CLINICAL DATA:  Ovarian cancer with omental metastases, restaging, prior BILATERAL breast cancer in 1983 and 2003 post mastectomies, history hypertension, hyperlipidemia, kidney stones, asthma  EXAM: CT CHEST, ABDOMEN, AND PELVIS WITH CONTRAST  TECHNIQUE: Multidetector CT imaging of the chest, abdomen and pelvis was performed following the standard protocol during bolus administration of intravenous contrast. Sagittal and coronal MPR images reconstructed from axial data set.  CONTRAST:  169m OMNIPAQUE IOHEXOL 300 MG/ML SOLN IV. Dilute oral contrast.  COMPARISON:  10/01/2012 CT abdomen and pelvis, PET-CT 03/05/2013,  CT  chest 10/23/2007  FINDINGS: CT CHEST FINDINGS BILATERAL peripelvic renal cysts.  Small angiomyolipoma posterior LEFT kidney 10 x 10 mm image 22.  Remainder of liver, spleen, pancreas, kidneys, and adrenal glands normal appearance.  Thoracic vascular structures patent on nondedicated exam.  Azygos fissure noted.  Surgical clips RIGHT axilla.  No mediastinal, hilar, or axillary adenopathy.  Few calcified pulmonary granulomata.  Additional tiny nonspecific nodular foci in RIGHT lung are stable, not definitively calcified.  No pulmonary infiltrate, pleural effusion, pneumothorax, or acute osseous findings.  Bones demineralized.  CT ABDOMEN AND PELVIS FINDINGS  Mild fatty infiltration of liver.  BILATERAL peripelvic renal cysts.  Small angiomyolipoma posterior LEFT kidney 10 x 10 mm, stable.  Sigmoid diverticulosis without evidence of diverticulitis.  Small supraumbilical ventral hernia containing a small portion of the mid transverse colon as a Richter hernia.  No associated bowel wall thickening or evidence of obstruction.  No omental or mesenteric tumor nodularity or infiltration.  Again identified small amount of fluid or thickening at the ventral abdominal surgical wound/scar approximately 2.2 x 1.6 x 3.5 cm.  Mild protrusion of infraumbilical midline fascia but grossly intact without herniation.  No mass, adenopathy, free fluid, or free air.  Specifically, no gastrohepatic ligament adenopathy.  Uterus surgically absent with nonvisualization of ovaries.  Slightly decreased prominence of LEFT pelvic soft tissue density seen previously.  Area of soft tissue density in the RIGHT pelvis 2.2 x 0.9 cm image 108, minimally more prominent though uncertain if represents residual tumor or slightly increased scarring.  Bladder and ureters unremarkable.  No acute osseous findings, with tiny bone island at L1 stable.  IMPRESSION: No acute intra thoracic abnormalities.  Few tiny calcified and noncalcified pulmonary nodules stable  since previous exam.  BILATERAL parapelvic renal cysts and small LEFT renal angiomyolipoma.  Small supraumbilical Richter hernia containing a small portion of the mid transverse colon without bowel wall thickening or obstruction.  Resolution of omental and mesenteric tumor infiltration since 10/01/2012.  Areas soft tissue in the RIGHT pelvis is minimal or prominent than on the previous exam, approximately 2.2 x 0.9 cm, uncertain if represents progressive scarring, difficult to exclude residual tumor though this the demonstrated no FDG localization on PET-CT ; recommend attention on followup exams.   Electronically Signed   By: Lavonia Dana M.D.   On: 07/04/2013 10:53   Ct Abdomen Pelvis W Contrast  07/04/2013   CLINICAL DATA:  Ovarian cancer with omental metastases, restaging, prior BILATERAL breast cancer in 1983 and 2003 post mastectomies, history hypertension, hyperlipidemia, kidney stones, asthma  EXAM: CT CHEST, ABDOMEN, AND PELVIS WITH CONTRAST  TECHNIQUE: Multidetector CT imaging of the chest, abdomen and pelvis was performed following the standard protocol during bolus administration of intravenous contrast. Sagittal and coronal MPR images reconstructed from axial data set.  CONTRAST:  140m OMNIPAQUE IOHEXOL 300 MG/ML SOLN IV. Dilute oral contrast.  COMPARISON:  10/01/2012 CT abdomen and pelvis, PET-CT 03/05/2013, CT chest 10/23/2007  FINDINGS: CT CHEST FINDINGS BILATERAL peripelvic renal cysts.  Small angiomyolipoma posterior LEFT kidney 10 x 10 mm image 22.  Remainder of liver, spleen, pancreas, kidneys, and adrenal glands normal appearance.  Thoracic vascular structures patent on nondedicated exam.  Azygos fissure noted.  Surgical clips RIGHT axilla.  No mediastinal, hilar, or axillary adenopathy.  Few calcified pulmonary granulomata.  Additional tiny nonspecific nodular foci in RIGHT lung are stable, not definitively calcified.  No pulmonary infiltrate, pleural effusion, pneumothorax, or acute osseous  findings.  Bones demineralized.  CT ABDOMEN AND PELVIS FINDINGS  Mild fatty infiltration of liver.  BILATERAL peripelvic renal cysts.  Small angiomyolipoma posterior LEFT kidney 10 x 10 mm, stable.  Sigmoid diverticulosis without evidence of diverticulitis.  Small supraumbilical ventral hernia containing a small portion of the mid transverse colon as a Richter hernia.  No associated bowel wall thickening or evidence of obstruction.  No omental or mesenteric tumor nodularity or infiltration.  Again identified small amount of fluid or thickening at the ventral abdominal surgical wound/scar approximately 2.2 x 1.6 x 3.5 cm.  Mild protrusion of infraumbilical midline fascia but grossly intact without herniation.  No mass, adenopathy, free fluid, or free air.  Specifically, no gastrohepatic ligament adenopathy.  Uterus surgically absent with nonvisualization of ovaries.  Slightly decreased prominence of LEFT pelvic soft tissue density seen previously.  Area of soft tissue density in the RIGHT pelvis 2.2 x 0.9 cm image 108, minimally more prominent though uncertain if represents residual tumor or slightly increased scarring.  Bladder and ureters unremarkable.  No acute osseous findings, with tiny bone island at L1 stable.  IMPRESSION: No acute intra thoracic abnormalities.  Few tiny calcified and noncalcified pulmonary nodules stable since previous exam.  BILATERAL parapelvic renal cysts and small LEFT renal angiomyolipoma.  Small supraumbilical Richter hernia containing a small portion of the mid transverse colon without bowel wall thickening or obstruction.  Resolution of omental and mesenteric tumor infiltration since 10/01/2012.  Areas soft tissue in the RIGHT pelvis is minimal or prominent than on the previous exam, approximately 2.2 x 0.9 cm, uncertain if represents progressive scarring, difficult to exclude residual tumor though this the demonstrated no FDG localization on PET-CT ; recommend attention on followup  exams.   Electronically Signed   By: Lavonia Dana M.D.   On: 07/04/2013 10:53    ASSESSMENT:  1. Stage IIIc ovarian cancer, no evidence of disease, to continue maintenance therapy with Avastin in one week, repeat CT scan stable. 2. Peripheral neuropathy from previous chemotherapy, improved on Cymbalta, improved.  3. History of allergy to carboplatin and with the emergence of neuropathy, VP-16 was chosen as treatment after debulking surgery preceded by 6 cycles of carboplatin and Taxol.  4. Hypertension, controlled.  5. Right knee pain, Xray showing mild tricompartmental osteoarthritic change. Will refer to Dr. Aline Brochure (Ortho), status post knee injection with excellent relief.     PLAN:  #1. Intravenous Avastin in one week. #2. Office visit with CBC, chem profile, CA 125, and urinalysis in 4 weeks.   All questions were answered. The patient knows to call the clinic with any problems, questions or concerns. We can certainly see the patient much sooner if necessary.   I spent 25 minutes counseling the patient face to face. The total time spent in the appointment was 30 minutes.    Farrel Gobble, MD 07/08/2013 10:31 AM  DISCLAIMER:  This note was dictated with voice recognition software.  Similar sounding words can inadvertently be transcribed inaccurately and may not be corrected upon review.

## 2013-07-08 NOTE — Patient Instructions (Signed)
[facility] Discharge Instructions  RECOMMENDATIONS MADE BY THE CONSULTANT AND ANY TEST RESULTS WILL BE SENT TO YOUR REFERRING PHYSICIAN.  EXAM FINDINGS BY THE PHYSICIAN TODAY AND SIGNS OR SYMPTOMS TO REPORT TO CLINIC OR PRIMARY PHYSICIAN: Exam and findings as discussed by Dr. Barnet Glasgow.  INSTRUCTIONS/FOLLOW-UP: 1.  Please keep your appointments for chemo in 1 week and to see the oncologist in 1 month.  Call us sooner if questions or concerns!  Thank you for choosing [facility] to provide your oncology and hematology care.  To afford each patient quality time with our providers, please arrive at least 15 minutes before your scheduled appointment time.  With your help, our goal is to use those 15 minutes to complete the necessary work-up to ensure our physicians have the information they need to help with your evaluation and healthcare recommendations.    Effective January 1st, 2014, we ask that you re-schedule your appointment with our physicians should you arrive 10 or more minutes late for your appointment.  We strive to give you quality time with our providers, and arriving late affects you and other patients whose appointments are after yours.    Again, thank you for choosing [facility].  Our hope is that these requests will decrease the amount of time that you wait before being seen by our physicians.       _____________________________________________________________  Should you have questions after your visit to [facility], please contact our office at [phone] between the hours of 8:30 a.m. and 5:00 p.m.  Voicemails left after 4:30 p.m. will not be returned until the following business day.  For prescription refill requests, have your pharmacy contact our office with your prescription refill request.

## 2013-07-15 ENCOUNTER — Encounter (HOSPITAL_BASED_OUTPATIENT_CLINIC_OR_DEPARTMENT_OTHER): Payer: Medicare HMO

## 2013-07-15 VITALS — BP 146/70 | HR 75 | Temp 97.3°F | Resp 18 | Wt 195.6 lb

## 2013-07-15 DIAGNOSIS — C569 Malignant neoplasm of unspecified ovary: Secondary | ICD-10-CM

## 2013-07-15 DIAGNOSIS — C786 Secondary malignant neoplasm of retroperitoneum and peritoneum: Secondary | ICD-10-CM

## 2013-07-15 DIAGNOSIS — Z5112 Encounter for antineoplastic immunotherapy: Secondary | ICD-10-CM

## 2013-07-15 LAB — CBC WITH DIFFERENTIAL/PLATELET
BASOS PCT: 0 % (ref 0–1)
Basophils Absolute: 0 10*3/uL (ref 0.0–0.1)
EOS PCT: 3 % (ref 0–5)
Eosinophils Absolute: 0.2 10*3/uL (ref 0.0–0.7)
HEMATOCRIT: 41.9 % (ref 36.0–46.0)
HEMOGLOBIN: 13.9 g/dL (ref 12.0–15.0)
Lymphocytes Relative: 38 % (ref 12–46)
Lymphs Abs: 2.5 10*3/uL (ref 0.7–4.0)
MCH: 28.3 pg (ref 26.0–34.0)
MCHC: 33.2 g/dL (ref 30.0–36.0)
MCV: 85.3 fL (ref 78.0–100.0)
MONO ABS: 0.5 10*3/uL (ref 0.1–1.0)
Monocytes Relative: 8 % (ref 3–12)
Neutro Abs: 3.4 10*3/uL (ref 1.7–7.7)
Neutrophils Relative %: 51 % (ref 43–77)
Platelets: 263 10*3/uL (ref 150–400)
RBC: 4.91 MIL/uL (ref 3.87–5.11)
RDW: 15.3 % (ref 11.5–15.5)
WBC: 6.6 10*3/uL (ref 4.0–10.5)

## 2013-07-15 LAB — COMPREHENSIVE METABOLIC PANEL
ALBUMIN: 3.3 g/dL — AB (ref 3.5–5.2)
ALT: 18 U/L (ref 0–35)
AST: 18 U/L (ref 0–37)
Alkaline Phosphatase: 116 U/L (ref 39–117)
BUN: 15 mg/dL (ref 6–23)
CO2: 28 mEq/L (ref 19–32)
CREATININE: 0.83 mg/dL (ref 0.50–1.10)
Calcium: 9.6 mg/dL (ref 8.4–10.5)
Chloride: 102 mEq/L (ref 96–112)
GFR calc Af Amer: 79 mL/min — ABNORMAL LOW (ref >90)
GFR, EST NON AFRICAN AMERICAN: 68 mL/min — AB (ref >90)
Glucose, Bld: 103 mg/dL — ABNORMAL HIGH (ref 70–99)
Potassium: 5 mEq/L (ref 3.7–5.3)
Sodium: 139 mEq/L (ref 137–147)
Total Bilirubin: 0.3 mg/dL (ref 0.3–1.2)
Total Protein: 6.7 g/dL (ref 6.0–8.3)

## 2013-07-15 LAB — URINALYSIS, DIPSTICK ONLY
BILIRUBIN URINE: NEGATIVE
Glucose, UA: NEGATIVE mg/dL
HGB URINE DIPSTICK: NEGATIVE
KETONES UR: NEGATIVE mg/dL
Nitrite: NEGATIVE
PH: 6 (ref 5.0–8.0)
PROTEIN: NEGATIVE mg/dL
Specific Gravity, Urine: 1.025 (ref 1.005–1.030)
Urobilinogen, UA: 0.2 mg/dL (ref 0.0–1.0)

## 2013-07-15 MED ORDER — SODIUM CHLORIDE 0.9 % IV SOLN
15.0000 mg/kg | Freq: Once | INTRAVENOUS | Status: AC
  Administered 2013-07-15: 1325 mg via INTRAVENOUS
  Filled 2013-07-15: qty 45

## 2013-07-15 MED ORDER — HEPARIN SOD (PORK) LOCK FLUSH 100 UNIT/ML IV SOLN
500.0000 [IU] | Freq: Once | INTRAVENOUS | Status: AC | PRN
  Administered 2013-07-15: 500 [IU]
  Filled 2013-07-15: qty 5

## 2013-07-15 MED ORDER — SODIUM CHLORIDE 0.9 % IV SOLN
Freq: Once | INTRAVENOUS | Status: AC
  Administered 2013-07-15: 11:00:00 via INTRAVENOUS

## 2013-07-15 MED ORDER — SODIUM CHLORIDE 0.9 % IJ SOLN: 10.0000 mL | INTRAMUSCULAR | Status: DC | PRN

## 2013-07-15 NOTE — Progress Notes (Signed)
Tolerated well

## 2013-07-16 LAB — CA 125: CA 125: 103.6 U/mL — AB (ref 0.0–30.2)

## 2013-07-25 IMAGING — CT CT ABD-PELV W/ CM
2 of 5 series · 15 of 46 positions shown, 17 images · IV contrast (Omnipaque 300)
Comparison: 05/14/2012

CLINICAL DATA: Left lower abdominal pain with nausea.  Follow up of
ovarian cancer, status post three cycles of chemotherapy.
Bilateral breast cancer in 6220 and 9664.  Omental metastasis.

CT ABDOMEN AND PELVIS WITH CONTRAST
TECHNIQUE: Multidetector CT imaging of the abdomen and pelvis was
performed following the standard protocol during bolus
administration of intravenous contrast.
Contrast: 100mL OMNIPAQUE IOHEXOL 300 MG/ML  SOLN

[Series 2: abd_pel_with 5.0 b40f · axial · 0.73mm/px · z∈[-508,-58]mm · 12 of 102 slices shown, 14 images]
[im 6/102  soft-tissue]
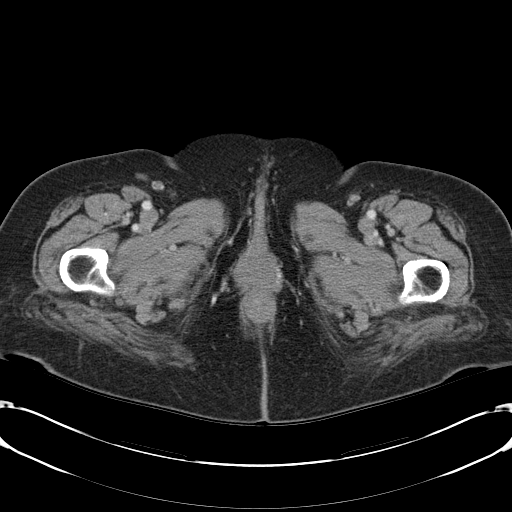
[im 6/102  bone]
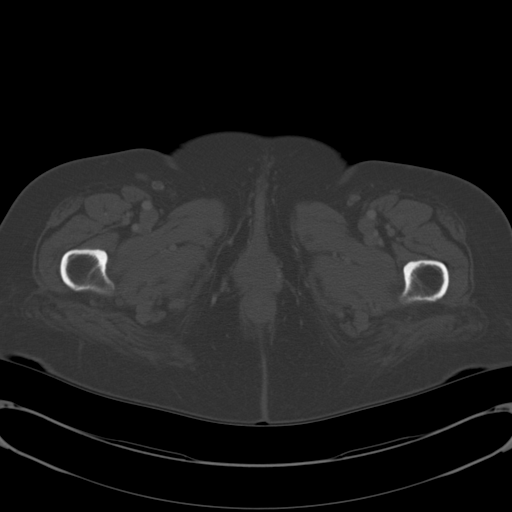
[im 17/102  soft-tissue]
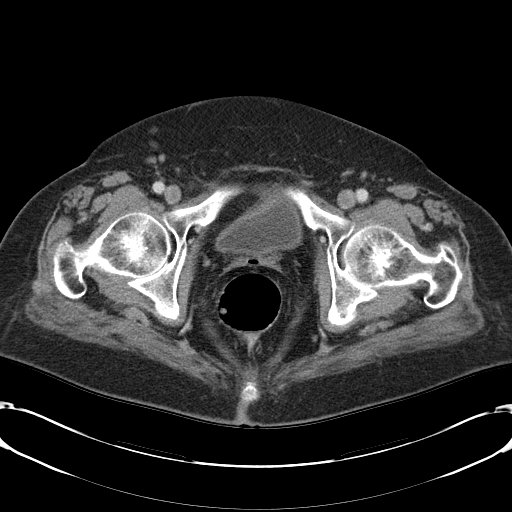
[im 23/102  soft-tissue]
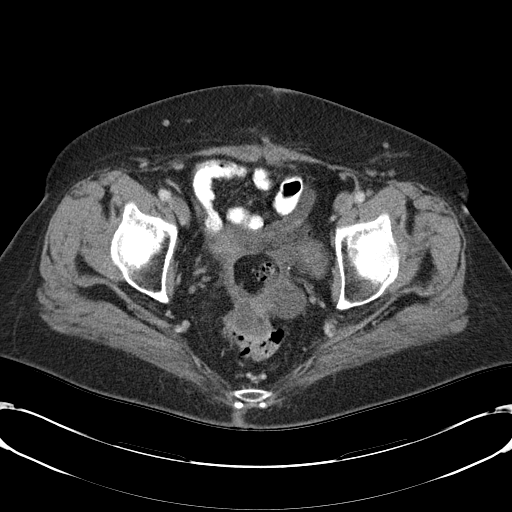
[im 29/102  soft-tissue]
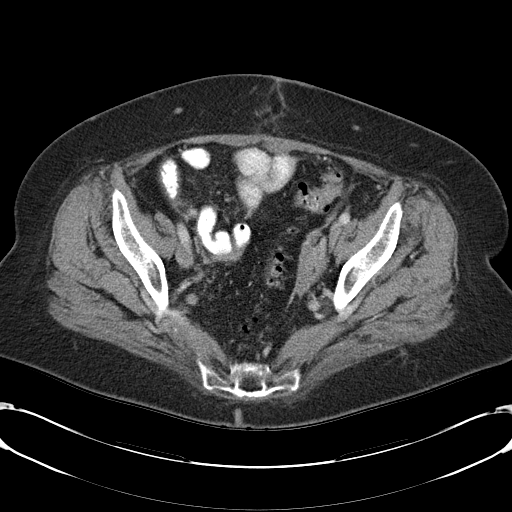
[im 40/102  soft-tissue]
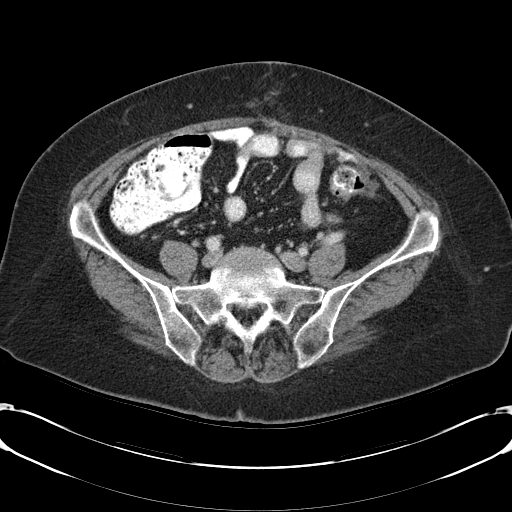
[im 45/102  soft-tissue]
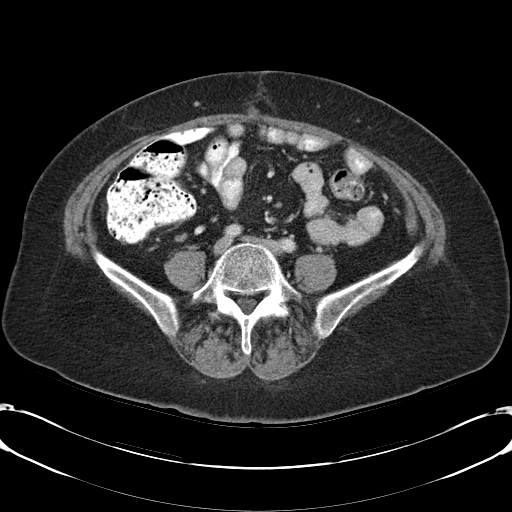
[im 57/102  soft-tissue]
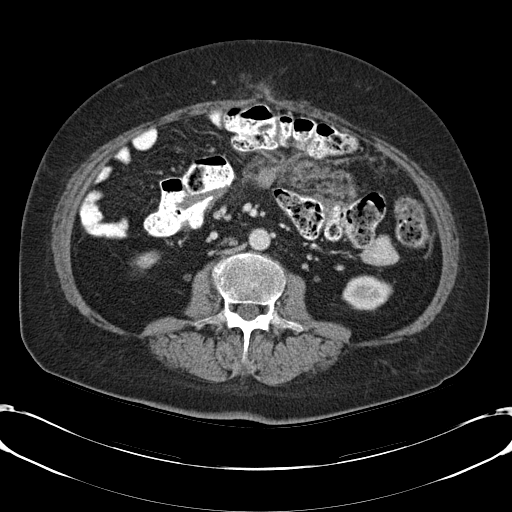
[im 62/102  soft-tissue]
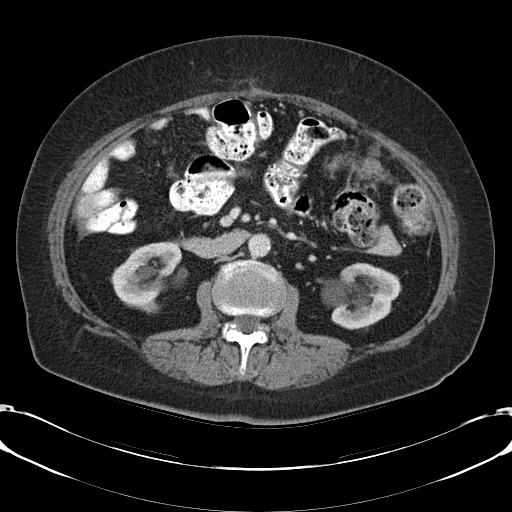
[im 73/102  soft-tissue]
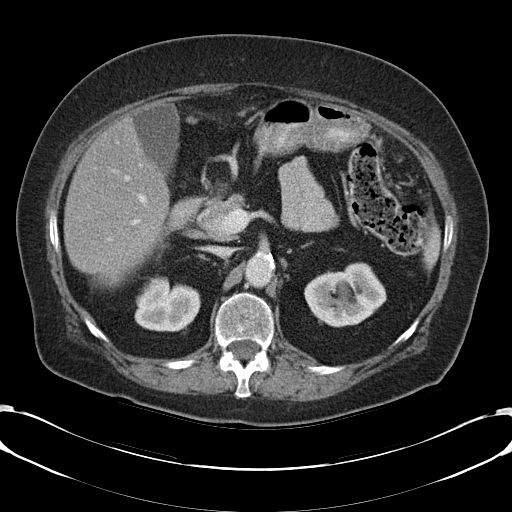
[im 73/102  bone]
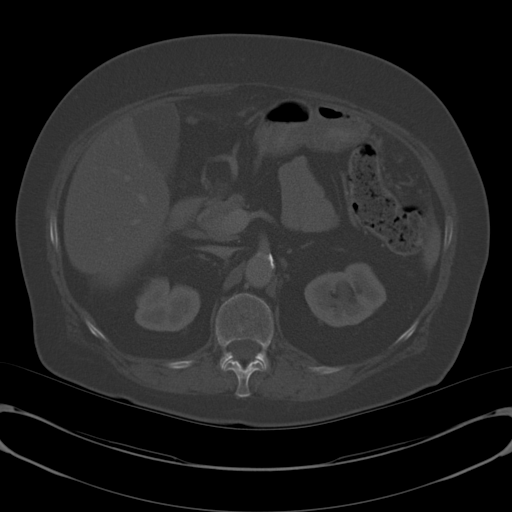
[im 79/102  soft-tissue]
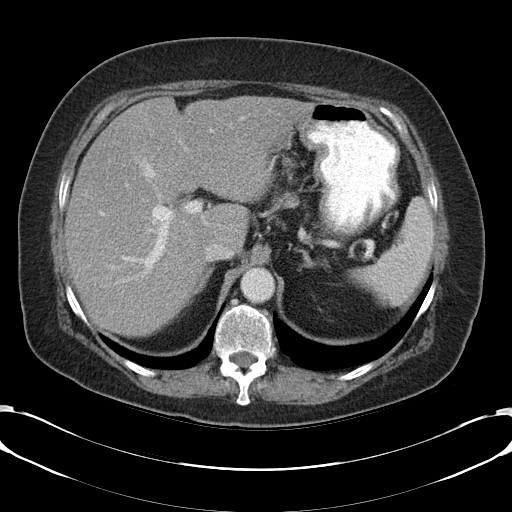
[im 85/102  soft-tissue]
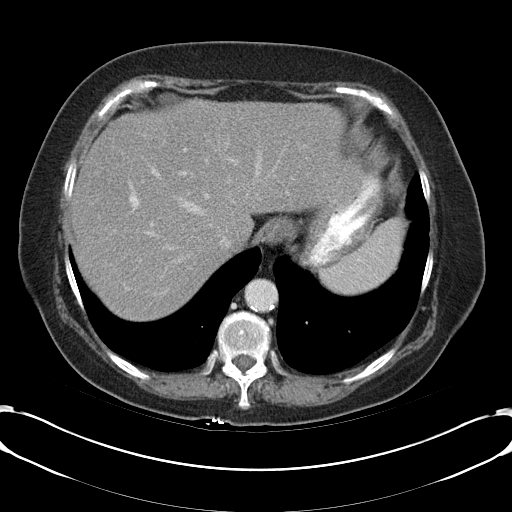
[im 96/102  soft-tissue]
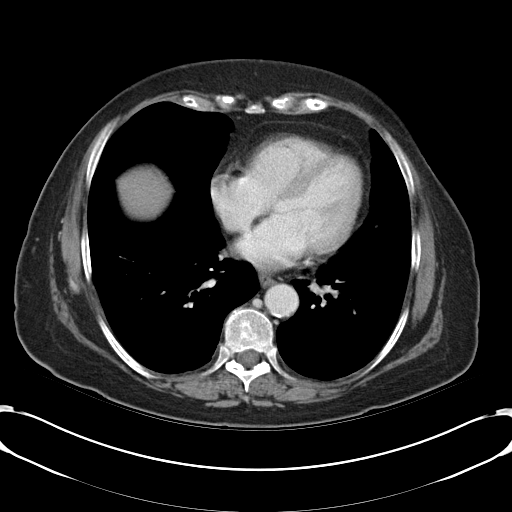

[Series 4: abd_pel_with 3.0 spo cor · coronal · 0.74mm/px · 3 of 91 slices shown]
[im 31/91  soft-tissue]
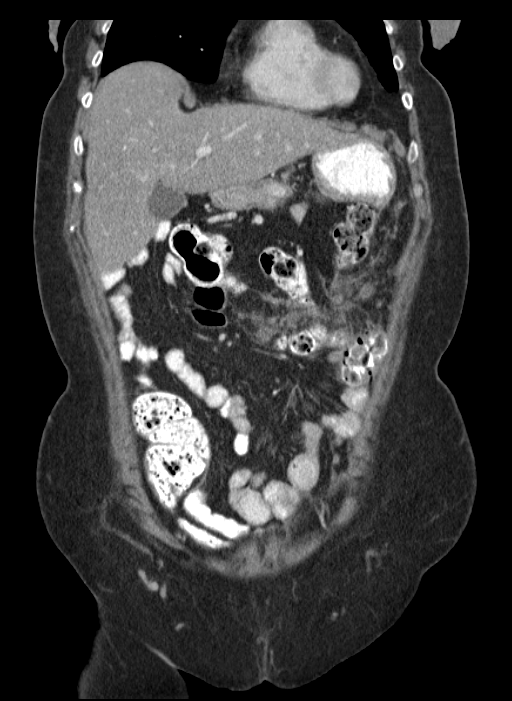
[im 41/91  soft-tissue]
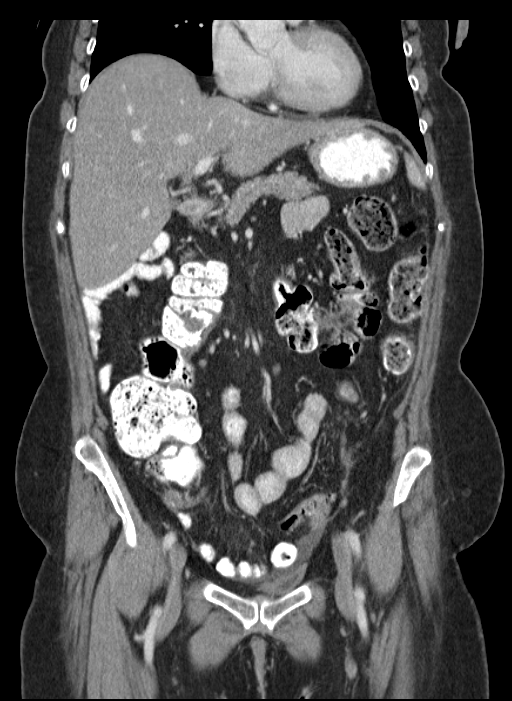
[im 51/91  soft-tissue]
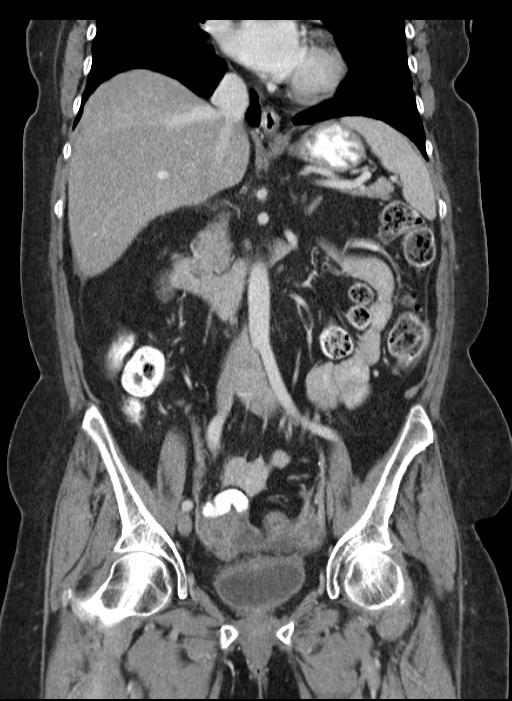

[15 of 46 positions shown; findings below may reference images not displayed]

FINDINGS: Lung bases:  Bilateral mastectomy. Clear lung bases.
Normal heart size without pericardial or pleural effusion.

Abdomen/pelvis:  Normal liver, spleen, stomach, pancreas,
gallbladder, biliary tract, adrenal glands.  Left renal
angiomyolipoma of 9 mm.  Bilateral renal sinus cysts, without
hydronephrosis.

No retroperitoneal or retrocrural adenopathy.

Sigmoid diverticulosis with muscular hypertrophy. Normal terminal
ileum.  Normal caliber of small bowel loops.  Nearly completely
resolved abdominal ascites.  There is minimal fluid identified
adjacent the spleen on image 25/series 2 and along the right lobe
of the liver on image 37/series 2.  Slight decrease in omental
nodularity/caking.  This is ill-defined, but measures on the order
of 8.0 x 3.6 cm on image 45/series 2.

No pelvic adenopathy.  Normal urinary bladder.  Similar cul-de-sac
small volume fluid, minimally eccentric left.  Left adnexal mass
measures 4.2 x 2.7 cm versus 7.0 x 4.0 cm on the prior.  Right
adnexal mass of 3.5 x 2.5 cm compares with 3.8 x 2.4 cm on the
prior.

Bones/Musculoskeletal:  Mild osteopenia.  L4-S1 disc bulges.
IMPRESSION: 1.  Overall response to therapy, with decreased omental/peritoneal
disease and nearly completely resolved ascites.
2.  Decreased size of bilateral ovarian/adnexal masses.
3.  Similar small volume cul-de-sac fluid.
4.  Similar left renal angiomyolipoma.

## 2013-08-02 NOTE — Progress Notes (Signed)
Labs drawn

## 2013-08-05 ENCOUNTER — Encounter (HOSPITAL_BASED_OUTPATIENT_CLINIC_OR_DEPARTMENT_OTHER): Payer: Medicare HMO

## 2013-08-05 ENCOUNTER — Encounter (HOSPITAL_COMMUNITY): Payer: Medicare HMO | Attending: Oncology

## 2013-08-05 ENCOUNTER — Encounter (HOSPITAL_COMMUNITY): Payer: Self-pay

## 2013-08-05 VITALS — BP 133/83 | HR 93 | Temp 98.5°F | Resp 18 | Wt 194.5 lb

## 2013-08-05 DIAGNOSIS — G609 Hereditary and idiopathic neuropathy, unspecified: Secondary | ICD-10-CM

## 2013-08-05 DIAGNOSIS — I10 Essential (primary) hypertension: Secondary | ICD-10-CM

## 2013-08-05 DIAGNOSIS — C786 Secondary malignant neoplasm of retroperitoneum and peritoneum: Secondary | ICD-10-CM

## 2013-08-05 DIAGNOSIS — C569 Malignant neoplasm of unspecified ovary: Secondary | ICD-10-CM

## 2013-08-05 LAB — URINALYSIS, DIPSTICK ONLY
Bilirubin Urine: NEGATIVE
Glucose, UA: NEGATIVE mg/dL
HGB URINE DIPSTICK: NEGATIVE
Ketones, ur: NEGATIVE mg/dL
NITRITE: NEGATIVE
UROBILINOGEN UA: 0.2 mg/dL (ref 0.0–1.0)
pH: 5.5 (ref 5.0–8.0)

## 2013-08-05 LAB — CBC WITH DIFFERENTIAL/PLATELET
Basophils Absolute: 0 10*3/uL (ref 0.0–0.1)
Basophils Relative: 0 % (ref 0–1)
Eosinophils Absolute: 0.2 10*3/uL (ref 0.0–0.7)
Eosinophils Relative: 2 % (ref 0–5)
HCT: 43.2 % (ref 36.0–46.0)
Hemoglobin: 13.9 g/dL (ref 12.0–15.0)
LYMPHS PCT: 37 % (ref 12–46)
Lymphs Abs: 2.6 10*3/uL (ref 0.7–4.0)
MCH: 27.5 pg (ref 26.0–34.0)
MCHC: 32.2 g/dL (ref 30.0–36.0)
MCV: 85.4 fL (ref 78.0–100.0)
Monocytes Absolute: 0.6 10*3/uL (ref 0.1–1.0)
Monocytes Relative: 8 % (ref 3–12)
Neutro Abs: 3.7 10*3/uL (ref 1.7–7.7)
Neutrophils Relative %: 53 % (ref 43–77)
PLATELETS: 277 10*3/uL (ref 150–400)
RBC: 5.06 MIL/uL (ref 3.87–5.11)
RDW: 16 % — ABNORMAL HIGH (ref 11.5–15.5)
WBC: 7 10*3/uL (ref 4.0–10.5)

## 2013-08-05 LAB — COMPREHENSIVE METABOLIC PANEL
ALT: 23 U/L (ref 0–35)
AST: 21 U/L (ref 0–37)
Albumin: 3.8 g/dL (ref 3.5–5.2)
Alkaline Phosphatase: 105 U/L (ref 39–117)
BUN: 19 mg/dL (ref 6–23)
CO2: 24 meq/L (ref 19–32)
Calcium: 9.8 mg/dL (ref 8.4–10.5)
Chloride: 103 mEq/L (ref 96–112)
Creatinine, Ser: 0.82 mg/dL (ref 0.50–1.10)
GFR, EST AFRICAN AMERICAN: 80 mL/min — AB (ref >90)
GFR, EST NON AFRICAN AMERICAN: 69 mL/min — AB (ref >90)
GLUCOSE: 102 mg/dL — AB (ref 70–99)
POTASSIUM: 4.6 meq/L (ref 3.7–5.3)
SODIUM: 140 meq/L (ref 137–147)
TOTAL PROTEIN: 7.4 g/dL (ref 6.0–8.3)
Total Bilirubin: 0.6 mg/dL (ref 0.3–1.2)

## 2013-08-05 MED ORDER — HEPARIN SOD (PORK) LOCK FLUSH 100 UNIT/ML IV SOLN
500.0000 [IU] | Freq: Once | INTRAVENOUS | Status: AC
  Administered 2013-08-05: 500 [IU] via INTRAVENOUS
  Filled 2013-08-05: qty 5

## 2013-08-05 MED ORDER — SODIUM CHLORIDE 0.9 % IJ SOLN
10.0000 mL | INTRAMUSCULAR | Status: AC | PRN
  Administered 2013-08-05: 10 mL via INTRAVENOUS

## 2013-08-05 NOTE — Progress Notes (Signed)
Rita Humphrey presented for labwork. Labs per MD order drawn via Portacath located in the left chest wall accessed with  H 20 needle. Good blood return present. Procedure without incident.  Needle removed intact. Patient tolerated procedure well.   

## 2013-08-05 NOTE — Progress Notes (Signed)
Labs drawn

## 2013-08-05 NOTE — Addendum Note (Signed)
Addended by: Mellissa Kohut on: 08/05/2013 11:43 AM   Modules accepted: Orders, SmartSet

## 2013-08-05 NOTE — Patient Instructions (Signed)
[facility] Discharge Instructions  RECOMMENDATIONS MADE BY THE CONSULTANT AND ANY TEST RESULTS WILL BE SENT TO YOUR REFERRING PHYSICIAN.  EXAM FINDINGS BY THE PHYSICIAN TODAY AND SIGNS OR SYMPTOMS TO REPORT TO CLINIC OR PRIMARY PHYSICIAN: Exam and findings as discussed by Dr. Barnet Glasgow.  Tumor marker is rising but CT scans are unchanged.  Will do PET scan to see what's going on.  Nothing to eat or drink 6 hours prior to the scan.  No chewing gum, mints,hard candy,etc.  Nothing with sugar in it.  MEDICATIONS PRESCRIBED:  none  INSTRUCTIONS/FOLLOW-UP: Follow-up after scans.  Thank you for choosing [facility] to provide your oncology and hematology care.  To afford each patient quality time with our providers, please arrive at least 15 minutes before your scheduled appointment time.  With your help, our goal is to use those 15 minutes to complete the necessary work-up to ensure our physicians have the information they need to help with your evaluation and healthcare recommendations.    Effective January 1st, 2014, we ask that you re-schedule your appointment with our physicians should you arrive 10 or more minutes late for your appointment.  We strive to give you quality time with our providers, and arriving late affects you and other patients whose appointments are after yours.    Again, thank you for choosing [facility].  Our hope is that these requests will decrease the amount of time that you wait before being seen by our physicians.       _____________________________________________________________  Should you have questions after your visit to [facility], please contact our office at [phone] between the hours of 8:30 a.m. and 5:00 p.m.  Voicemails left after 4:30 p.m. will not be returned until the following business day.  For prescription refill requests, have your pharmacy contact our office with your prescription refill  request.

## 2013-08-05 NOTE — Progress Notes (Signed)
Chemo held

## 2013-08-05 NOTE — Progress Notes (Signed)
[facility]  OFFICE PROGRESS NOTE  Rita Cahill, MD  [place] [address]  DIAGNOSIS: Ovarian cancer - Plan: NM PET Image Restag (PS) Skull Base To Thigh  No chief complaint on file.   CURRENT THERAPY: Maintenance Avastin every 3 weeks started on 03/19/2013.  INTERVAL HISTORY: Rita Humphrey 73 y.o. female returns for followup while receiving maintenance Avastin for peritoneal carcinomatosis with treatment started on 03/19/2013 after having received carboplatin/docetaxel, carboplatin/paclitaxel, and carboplatin/Abraxane followed by 4 cycles of etoposide albeit after debulking surgery on 06/05/2012. He was started on Avastin maintenance on 03/19/2013.  She continues to do well except for fullness in the left side of the abdomen. She denies any nausea, vomiting, diarrhea, constipation, melena, hematochezia, hematuria, worsening lower extremity swelling or redness, fever, night sweats, sore throat, skin rash, headache, or seizures. She denies any incontinence, cough, wheezing, or joint pain.   MEDICAL HISTORY: Past Medical History  Diagnosis Date  . Hypertension   . Hyperlipemia   . Thyroid disease   . Asthma   . Adnexal mass 05/16/2012  . 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 Cancer     [place]  . Breast cancer     s/p bilateral mastectomy [id number]  . Ovarian cancer 05/17/2012  . 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 Peripheral vascular disease 2013    hx of DVT right leg  . History of kidney stones   . Anemia   . Platelets decreased april 2014     1 transfusion given before chemo  . Deafness in left ear   . Numbness in feet   . Numbness of fingers of both hands   . Dizziness     occasional  . History of breast cancer 12/07/2012    INTERIM HISTORY: has HTN (hypertension); Hyperlipemia; Low back pain; Neck pain; Adnexal mass; Ovarian cancer; Omental metastasis;  Peripheral neuropathy, secondary to chemotherapy; History of breast cancer; and Osteoarthritis of right knee on her problem list.   February 2014. She underwent a CT scan of the abdomen and pelvis on March 10. It reveals a moderate amount of ascites or peritoneal cavity. There are multiple soft tissue lesions noted throughout the peritoneal cavity consistent with peritoneal implants of tumor with omental caking as well. The abdominal structures are unremarkable. The pelvis has ascites as well as multiple peritoneal implants of tumor. The prominent adnexal soft tissue masses bilaterally. On the left, a soft tissue mass in the left adnexa measures 7 x 4 cm with a right adnexal soft tissue mass measuring 3.8 x 2.4 cm. She underwent a paracentesis on March 11. Pathology revealed malignant cells consistent with carcinoma. The malignant cells are positive for cytokeratin 7 and show a morphologic and immunophenotypic finding consistent with a gynecologic primary. CA 125 has been drawn that was over 1000. Further noted, the patient has a personal history of breast cancer and has never received genetic testing.  She underwent exploratory laparotomy and peritoneal biopsies on June 05, 2012. Operative findings included 3.7 L of ascites along with diffuse carcinomatosis involving the small bowel mesentery with agglutination of the small bowel mesentery. There was complete coverage of the right hemidiaphragm with tumor. The transverse colon and omentum were diffusely replaced with tumor that was not resectable. The left upper quadrant was agglutinated with tumor and there is no large abdominal pelvic mass in the left upper  quadrant. There is diffuse subcentimeter disease covering almost the entire surface of the small bowel mesentery. The right ovary was agglutinated to the rectosigmoid colon with a thick rind of tumor connecting it. She was not deemed resectable. A biopsy was performed that revealed high-grade carcinoma. 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 She underwent 1 cycle of Taxotere and carboplatin based chemotherapy in the hospital.  After 3 cycles of chemotherapy, her CA 125 went from 1097 to 54.9 on June 4. On May 27th she had a CT scan that revealed: Findings: Lung bases: Bilateral mastectomy. Clear lung bases. Normal heart size without pericardial or pleural effusion. Abdomen/pelvis: Normal liver, spleen, stomach, pancreas, gallbladder, biliary tract, adrenal glands. Left renal angiomyolipoma of 9 mm. Bilateral renal sinus cysts, without hydronephrosis. No retroperitoneal or retrocrural adenopathy. Sigmoid diverticulosis with muscular hypertrophy. Normal terminal ileum. Normal caliber of small bowel loops. Nearly completely resolved abdominal ascites. There is minimal fluid identified adjacent the spleen on image 25/series 2 and along the right lobe of the liver on image 37/series 2. Slight decrease in omental nodularity/caking. This is ill-defined, but measures on the order of 8.0 x 3.6 cm on image 45/series 2. No pelvic adenopathy. Normal urinary bladder. Similar cul-de-sac small volume fluid, minimally eccentric left. Left adnexal mass measures 4.2 x 2.7 cm versus 7.0 x 4.0 cm on the prior. Right adnexal mass of 3.5 x 2.5 cm compares with 3.8 x 2.4 cm on the prior. Bones/Musculoskeletal: Mild osteopenia. L4-S1 disc bulges. IMPRESSION: 1. Overall response to therapy, with decreased omental/peritoneal disease and nearly completely resolved ascites. 2. Decreased size of bilateral ovarian/adnexal masses. 3. Similar small volume cul-de-sac fluid. 4. Similar left renal angiomyolipoma.  She had cycle #4 of chemotherapy on June 5. She's undergone the subsequent 3 cycles with carboplatin and Taxol. Her first cycle of chemotherapy was with carboplatin and Taxotere. She had a CT scan on July 28 that revealed: The urinary bladder appears normal. Previous hysterectomy. Left adnexal soft tissue structure  measures 2.2 x 4.0 cm, image 76/series 2. The right adnexal soft tissue structure measures 2.3 x 3.1 cm, image 76/series 2. Previously 2.6 x 3.5 cm. Loculated fluid within the inferior left pelvis measures 3.5 x 2.5 cm, image 79/series 2. Previously 3.7 x 2.7 cm. Omental tumor is identified measuring 7.5 x 3.0 cm, image 43/series 2. Previously 7.9 x 3.3 cm. The abdominal aorta has a normal caliber. No aneurysm. No upper abdominal adenopathy identified. There is no enlarged pelvic or inguinal adenopathy identified. The stomach is normal. The small bowel loops are within normal limits. No evidence for small bowel obstruction. Normal caliber of the colon multiple distal colonic diverticula identified without acute inflammation. Review of the visualized bony structures is significant for mild lumbar spondylosis. No aggressive lytic or sclerotic bone lesions. IMPRESSION: 1. No acute findings. 2. Lateral mild improvement and peritoneal disease. No new or progressive disease identified. 3. No evidence for obstructive uropathy or bowel obstruction. Her CA 125 is normalized at 20.1.  On 10/30/12, she underwent a diagnostic laparoscopy, exploratory laparotomy, bilateral salpingo-oophorectomy, total omentectomy, and radical tumor debulking along with mobilization of splenic and hepatic flexures by Dr. Nancy Marus. Final pathology revealed: 1. Adnexa - ovary +/- tube, neoplastic, left - HIGH GRADE CARCINOMA, 2.3 CM, INVOLVING THE ADJACENT FALLOPIAN TUBE. - PLEASE SEE ONCOLOGY TEMPLATE FOR DETAILS. 2. Adnexa - ovary +/- tube, neoplastic, right - RESIDUAL HIGH GRADE CARCINOMA IN A BACKGROUND OF ENDOSALPINGIOSIS, 2.5 CM, INVOLVING THE OVARIAN CAPSULE - BENIGN FALLOPIAN  TUBAL TISSUE WITH PARATUBAL CYSTS. 3. 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   Ovarian cancer    05/15/2012  Initial Diagnosis  [facility] ovarian cancer    06/05/2012  Surgery  suboptimal debulking    06/07/2012 - 06/07/2012   Chemotherapy  Carboplatin/Docetaxel as inpatient at [facility] by Dr. Beryle Beams. 1 cycle.    06/28/2012 - 08/09/2012  Chemotherapy  Carboplatin/Paclitaxel x 3 cycles    08/30/2012 - 09/20/2012  Chemotherapy  Carboplatin/Abraxane x 2 cycles (due to increasing peripheral neuropathy)    10/30/2012  Surgery  exploratory laparotomy, optimal debulking. Tumor sent to [facility]    10/31/2012  Remission     12/17/2012 - 02/20/2013  Chemotherapy  Etoposide x 4 cycles    03/19/2013 -  Chemotherapy  Avastin maintenance      07/15/2013                  CA125=103.6          CT-R pelvic mass  ALLERGIES:  is allergic to gabapentin; penicillins; carboplatin; and morphine and related.  MEDICATIONS: has a current medication list which includes the following prescription(s): amlodipine, docusate calcium, duloxetine, lidocaine-prilocaine, oxycodone-acetaminophen, potassium chloride sa, tetrahydroz-dextran-peg-povid, benzonatate, vitamin d3, ondansetron, prochlorperazine, and temazepam.  SURGICAL HISTORY:  Past Surgical History  Procedure Laterality Date  . Thyroid lobectomy    . Foot surgery      2 spurs taken out of foot  . Varicose vein surgery    . Bladder tack surgery     . Tonsillectomy    . Mastectomy      bilateral   . Laparotomy Bilateral 06/05/2012    Procedure: EXPLORATORY LAPAROTOMY WITH PELVIC TUMOR BIOPSY;  Surgeon: Imagene Gurney A. Alycia Rossetti, MD;  Location: [facility];  Service: Gynecology;  Laterality: Bilateral;  . Abdominal hysterectomy  1980  . Vascular surgery      veins stripped from both legs  . Portacath placement Left 06/25/2012    Procedure: INSERTION PORT-A-CATH;  Surgeon: Jamesetta So, MD;  Location: [facility];  Service: General;  Laterality: Left;  . Laparoscopy N/A 10/30/2012    Procedure: LAPAROSCOPY DIAGNOSTIC, EXPLORATORY LAPAROTOMY, BILATERAL SALPINGO-OOPHORECTOMY, TUMOR DEBULKING;  Surgeon: Imagene Gurney A. Alycia Rossetti, MD;  Location: [facility];  Service: Gynecology;  Laterality: N/A;     FAMILY HISTORY: family history includes Cancer in her mother; Heart attack in her brother; Hypertension in her father and sister.  SOCIAL HISTORY:  reports that she has never smoked. She has never used smokeless tobacco. She reports that she does not drink alcohol or use illicit drugs.  REVIEW OF SYSTEMS:  Other than that discussed above is noncontributory.  PHYSICAL EXAMINATION: ECOG PERFORMANCE STATUS: 1 - Symptomatic but completely ambulatory  Blood pressure 133/83, pulse 93, temperature 98.5 F (36.9 C), temperature source Oral, resp. rate 18, weight 194 lb 8 oz (88.225 kg).  GENERAL:alert, no distress and comfortable SKIN: skin color, texture, turgor are normal, no rashes or significant lesions EYES: PERLA; Conjunctiva are pink and non-injected, sclera clear SINUSES: No redness or tenderness over maxillary or ethmoid sinuses OROPHARYNX:no exudate, no erythema on lips, buccal mucosa, or tongue. NECK: supple, thyroid normal size, non-tender, without nodularity. No masses CHEST: LifePort in place. No breast masses. LYMPH:  no palpable lymphadenopathy in the cervical, axillary or inguinal LUNGS: clear to auscultation and percussion with normal breathing effort HEART: regular rate & rhythm and no murmurs. ABDOMEN:abdomen soft, non-tender and normal bowel sounds. Midline surgical scar well healed. No tenderness or  masses. MUSCULOSKELETAL:no cyanosis of digits and no clubbing. Range of motion normal.  NEURO: alert & oriented x 3 with fluent speech, no focal motor/sensory deficits   LABORATORY DATA:  Results for PRESCILLA, MONGER (MRN [id number]) as of 08/05/2013 10:21  Ref. Range 04/11/2013 12:10 05/06/2013 12:15 05/27/2013 12:00 06/24/2013 10:33 07/15/2013 10:10  CA 125 Latest Range: 0.0-30.2 U/mL 12.7 20.1 39.5 (H) 53.3 (H) 103.6 (H)     Appointment on 08/05/2013  Component Date Value Ref Range Status  . Specific Gravity, Urine 08/05/2013 >1.030* 1.005 - 1.030 Final  . pH 08/05/2013 5.5   5.0 - 8.0 Final  . Glucose, UA 08/05/2013 NEGATIVE  NEGATIVE mg/dL Final  . Hgb urine dipstick 08/05/2013 NEGATIVE  NEGATIVE Final  . Bilirubin Urine 08/05/2013 NEGATIVE  NEGATIVE Final  . Ketones, ur 08/05/2013 NEGATIVE  NEGATIVE mg/dL Final  . Protein, ur 08/05/2013 TRACE* NEGATIVE mg/dL Final  . Urobilinogen, UA 08/05/2013 0.2  0.0 - 1.0 mg/dL Final  . Nitrite 08/05/2013 NEGATIVE  NEGATIVE Final  . Leukocytes, UA 08/05/2013 SMALL* NEGATIVE Final  Infusion on 07/15/2013  Component Date Value Ref Range Status  . Specific Gravity, Urine 07/15/2013 1.025  1.005 - 1.030 Final  . pH 07/15/2013 6.0  5.0 - 8.0 Final  . Glucose, UA 07/15/2013 NEGATIVE  NEGATIVE mg/dL Final  . Hgb urine dipstick 07/15/2013 NEGATIVE  NEGATIVE Final  . Bilirubin Urine 07/15/2013 NEGATIVE  NEGATIVE Final  . Ketones, ur 07/15/2013 NEGATIVE  NEGATIVE mg/dL Final  . Protein, ur 07/15/2013 NEGATIVE  NEGATIVE mg/dL Final  . Urobilinogen, UA 07/15/2013 0.2  0.0 - 1.0 mg/dL Final  . Nitrite 07/15/2013 NEGATIVE  NEGATIVE Final  . Leukocytes, UA 07/15/2013 SMALL* NEGATIVE Final  . WBC 07/15/2013 6.6  4.0 - 10.5 K/uL Final  . RBC 07/15/2013 4.91  3.87 - 5.11 MIL/uL Final  . Hemoglobin 07/15/2013 13.9  12.0 - 15.0 g/dL Final  . HCT 07/15/2013 41.9  36.0 - 46.0 % Final  . MCV 07/15/2013 85.3  78.0 - 100.0 fL Final  . MCH 07/15/2013 28.3  26.0 - 34.0 pg Final  . MCHC 07/15/2013 33.2  30.0 - 36.0 g/dL Final  . RDW 07/15/2013 15.3  11.5 - 15.5 % Final  . Platelets 07/15/2013 263  150 - 400 K/uL Final  . Neutrophils Relative % 07/15/2013 51  43 - 77 % Final  . Neutro Abs 07/15/2013 3.4  1.7 - 7.7 K/uL Final  . Lymphocytes Relative 07/15/2013 38  12 - 46 % Final  . Lymphs Abs 07/15/2013 2.5  0.7 - 4.0 K/uL Final  . Monocytes Relative 07/15/2013 8  3 - 12 % Final  . Monocytes Absolute 07/15/2013 0.5  0.1 - 1.0 K/uL Final  . Eosinophils Relative 07/15/2013 3  0 - 5 % Final  . Eosinophils Absolute 07/15/2013 0.2  0.0 - 0.7  K/uL Final  . Basophils Relative 07/15/2013 0  0 - 1 % Final  . Basophils Absolute 07/15/2013 0.0  0.0 - 0.1 K/uL Final  . Sodium 07/15/2013 139  137 - 147 mEq/L Final  . Potassium 07/15/2013 5.0  3.7 - 5.3 mEq/L Final  . Chloride 07/15/2013 102  96 - 112 mEq/L Final  . CO2 07/15/2013 28  19 - 32 mEq/L Final  . Glucose, Bld 07/15/2013 103* 70 - 99 mg/dL Final  . BUN 07/15/2013 15  6 - 23 mg/dL Final  . Creatinine, Ser 07/15/2013 0.83  0.50 - 1.10 mg/dL Final  . Calcium 07/15/2013 9.6  8.4 - 10.5  mg/dL Final  . Total Protein 07/15/2013 6.7  6.0 - 8.3 g/dL Final  . Albumin 07/15/2013 3.3* 3.5 - 5.2 g/dL Final  . AST 07/15/2013 18  0 - 37 U/L Final  . ALT 07/15/2013 18  0 - 35 U/L Final  . Alkaline Phosphatase 07/15/2013 116  39 - 117 U/L Final  . Total Bilirubin 07/15/2013 0.3  0.3 - 1.2 mg/dL Final  . GFR calc non Af Amer 07/15/2013 68* >90 mL/min Final  . GFR calc Af Amer 07/15/2013 79* >90 mL/min Final   Comment: (NOTE)                          The eGFR has been calculated using the CKD EPI equation.                          This calculation has not been validated in all clinical situations.                          eGFR's persistently <90 mL/min signify possible Chronic Kidney                          Disease.  . CA 125 07/15/2013 103.6* 0.0 - 30.2 U/mL Final   Performed at [facility]: No new pathology.  Urinalysis    Component Value Date/Time   COLORURINE YELLOW 07/03/2012 1158   APPEARANCEUR CLEAR 07/03/2012 1158   LABSPEC >1.030* 08/05/2013 0946   PHURINE 5.5 08/05/2013 0946   GLUCOSEU NEGATIVE 08/05/2013 0946   HGBUR NEGATIVE 08/05/2013 0946   BILIRUBINUR NEGATIVE 08/05/2013 0946   KETONESUR NEGATIVE 08/05/2013 0946   PROTEINUR TRACE* 08/05/2013 0946   UROBILINOGEN 0.2 08/05/2013 0946   NITRITE NEGATIVE 08/05/2013 0946   LEUKOCYTESUR SMALL* 08/05/2013 0946    RADIOGRAPHIC STUDIES: CT Abdomen Pelvis W Contrast(07/04/2013 ) Status: Final result         PACS  Images    Show images for CT Abdomen Pelvis W Contrast         Study Result    CLINICAL DATA: Ovarian cancer with omental metastases, restaging,  prior BILATERAL breast cancer in 1983 and 2003 post mastectomies,  history hypertension, hyperlipidemia, kidney stones, asthma  EXAM:  CT CHEST, ABDOMEN, AND PELVIS WITH CONTRAST  TECHNIQUE:  Multidetector CT imaging of the chest, abdomen and pelvis was  performed following the standard protocol during bolus  administration of intravenous contrast. Sagittal and coronal MPR  images reconstructed from axial data set.  CONTRAST: 170m OMNIPAQUE IOHEXOL 300 MG/ML SOLN IV. Dilute oral  contrast.  COMPARISON: 10/01/2012 CT abdomen and pelvis, PET-CT 03/05/2013, CT  chest 10/23/2007  FINDINGS:  CT CHEST FINDINGS BILATERAL peripelvic renal cysts.  Small angiomyolipoma posterior LEFT kidney 10 x 10 mm image 22.  Remainder of liver, spleen, pancreas, kidneys, and adrenal glands  normal appearance.  Thoracic vascular structures patent on nondedicated exam.  Azygos fissure noted.  Surgical clips RIGHT axilla.  No mediastinal, hilar, or axillary adenopathy.  Few calcified pulmonary granulomata.  Additional tiny nonspecific nodular foci in RIGHT lung are stable,  not definitively calcified.  No pulmonary infiltrate, pleural effusion, pneumothorax, or acute  osseous findings.  Bones demineralized.  CT ABDOMEN AND PELVIS FINDINGS  Mild fatty infiltration of liver.  BILATERAL peripelvic renal cysts.  Small angiomyolipoma posterior LEFT kidney 10 x 10 mm,  stable.  Sigmoid diverticulosis without evidence of diverticulitis.  Small supraumbilical ventral hernia containing a small portion of  the mid transverse colon as a Richter hernia.  No associated bowel wall thickening or evidence of obstruction.  No omental or mesenteric tumor nodularity or infiltration.  Again identified small amount of fluid or thickening at the ventral  abdominal  surgical wound/scar approximately 2.2 x 1.6 x 3.5 cm.  Mild protrusion of infraumbilical midline fascia but grossly intact  without herniation.  No mass, adenopathy, free fluid, or free air.  Specifically, no gastrohepatic ligament adenopathy.  Uterus surgically absent with nonvisualization of ovaries.  Slightly decreased prominence of LEFT pelvic soft tissue density  seen previously.  Area of soft tissue density in the RIGHT pelvis 2.2 x 0.9 cm image  108, minimally more prominent though uncertain if represents  residual tumor or slightly increased scarring.  Bladder and ureters unremarkable.  No acute osseous findings, with tiny bone island at L1 stable.  IMPRESSION:  No acute intra thoracic abnormalities.  Few tiny calcified and noncalcified pulmonary nodules stable since  previous exam.  BILATERAL parapelvic renal cysts and small LEFT renal  angiomyolipoma.  Small supraumbilical Richter hernia containing a small portion of  the mid transverse colon without bowel wall thickening or  obstruction.  Resolution of omental and mesenteric tumor infiltration since  10/01/2012.  Areas soft tissue in the RIGHT pelvis is minimal or prominent than  on the previous exam, approximately 2.2 x 0.9 cm, uncertain if  represents progressive scarring, difficult to exclude residual tumor  though this the demonstrated no FDG localization on PET-CT ;  recommend attention on followup exams.  Electronically Signed  By: Lavonia Dana M.D.  On: 07/04/2013      ASSESSMENT:  #1. Stage IIIc ovarian cancer, probable progression with elevation in CA 125 as well as right pelvic mass on CT scan. #2. Peripheral neuropathy from previous chemotherapy, improved on Cymbalta with persistent involvement of lower extremities only. 3. History of allergy to carboplatin and with the emergence of neuropathy, VP-16 was chosen as treatment after debulking surgery preceded by 6 cycles of carboplatin and Taxol.  4.  Hypertension, controlled 5. Degenerative joint disease of the right knee, status post injection with excellent relief.   PLAN:  1. In the presence of her daughter, a discussion was held regarding what to do next. A PET CT scan will be done and if the only disease is localized to the right pelvis, we'll refer to Dr. Alycia Rossetti for possible surgical resection. If more extensive disease is found, would treat with alternative chemotherapy with the consideration that the patient is allergic to carboplatin and has peripheral neuropathy residual from Taxol. It may also be useful to obtain tissue and perform Foundation One analysis to determine which chemotherapy may be most advantageous again keeping in mind peripheral neuropathy and previous carboplatin allergy. #2. PET CT scan. Patient was told to call the day after the scan is done for further instructions. #3. Office visit in 3 weeks with CBC, chem profile, CA 125. Discontinue Avastin.   All questions were answered. The patient knows to call the clinic with any problems, questions or concerns. We can certainly see the patient much sooner if necessary.   I spent 25 minutes counseling the patient face to face. The total time spent in the appointment was 30 minutes.    Farrel Gobble, MD 08/05/2013 10:58 AM  DISCLAIMER:  This note was dictated with voice recognition software.  Similar sounding words can  inadvertently be transcribed inaccurately and may not be corrected upon review.

## 2013-08-06 LAB — CA 125: CA 125: 260.1 U/mL — ABNORMAL HIGH (ref 0.0–30.2)

## 2013-08-07 ENCOUNTER — Ambulatory Visit (HOSPITAL_COMMUNITY): Payer: Medicare HMO

## 2013-08-08 NOTE — Progress Notes (Signed)
This encounter was created in error - please disregard.

## 2013-08-14 ENCOUNTER — Encounter (HOSPITAL_COMMUNITY): Payer: Self-pay

## 2013-08-14 ENCOUNTER — Encounter (HOSPITAL_COMMUNITY)
Admission: RE | Admit: 2013-08-14 | Discharge: 2013-08-14 | Disposition: A | Discharge status: Home / Self Care | Payer: Medicare HMO | Source: Ambulatory Visit | Attending: Hematology and Oncology | Admitting: Hematology and Oncology

## 2013-08-14 DIAGNOSIS — C569 Malignant neoplasm of unspecified ovary: Secondary | ICD-10-CM

## 2013-08-14 LAB — GLUCOSE, CAPILLARY: Glucose-Capillary: 101 mg/dL — ABNORMAL HIGH (ref 70–99)

## 2013-08-14 MED ORDER — FLUDEOXYGLUCOSE F - 18 (FDG) INJECTION
10.4000 | Freq: Once | INTRAVENOUS | Status: AC | PRN
  Administered 2013-08-14: 10.4 via INTRAVENOUS

## 2013-08-19 ENCOUNTER — Telehealth: Payer: Self-pay | Admitting: *Deleted

## 2013-08-19 NOTE — Telephone Encounter (Signed)
Message copied by Lucile Crater on Mon Aug 19, 2013 11:06 AM ------      Message from: Nancy Marus      Created: Mon Aug 19, 2013  8:03 AM       I am happy to see her back.      Thanks      Nancy Marus      ----- Message -----         From: Farrel Gobble, MD         Sent: 08/14/2013   3:20 PM           To: Lucita Lora. Alycia Rossetti, MD, Lucile Crater, RN            Dr. Alycia Rossetti and colleagues:  Ms. Mohammad has been tolerating Avastin maintenance.  With increased ca-125, Ct suggested persistent disease in the pelvis.  PET scan is normal.  Would you like to see the patient for possible exploratory laporotolmy?  Jill Poling       ------

## 2013-08-20 NOTE — Telephone Encounter (Signed)
Fu appt scheduled for 09/05/13 at 0945am.pt notified

## 2013-08-23 ENCOUNTER — Encounter (HOSPITAL_BASED_OUTPATIENT_CLINIC_OR_DEPARTMENT_OTHER): Payer: Medicare HMO

## 2013-08-23 ENCOUNTER — Encounter (HOSPITAL_COMMUNITY): Payer: Self-pay

## 2013-08-23 VITALS — BP 130/83 | HR 91 | Temp 98.2°F | Resp 18 | Wt 194.2 lb

## 2013-08-23 DIAGNOSIS — C786 Secondary malignant neoplasm of retroperitoneum and peritoneum: Secondary | ICD-10-CM

## 2013-08-23 DIAGNOSIS — C569 Malignant neoplasm of unspecified ovary: Secondary | ICD-10-CM

## 2013-08-23 DIAGNOSIS — R971 Elevated cancer antigen 125 [CA 125]: Secondary | ICD-10-CM | POA: Diagnosis not present

## 2013-08-23 DIAGNOSIS — G620 Drug-induced polyneuropathy: Secondary | ICD-10-CM

## 2013-08-23 LAB — COMPREHENSIVE METABOLIC PANEL
ALT: 23 U/L (ref 0–35)
AST: 21 U/L (ref 0–37)
Albumin: 3.7 g/dL (ref 3.5–5.2)
Alkaline Phosphatase: 103 U/L (ref 39–117)
BUN: 17 mg/dL (ref 6–23)
CALCIUM: 9.7 mg/dL (ref 8.4–10.5)
CO2: 25 mEq/L (ref 19–32)
CREATININE: 0.89 mg/dL (ref 0.50–1.10)
Chloride: 101 mEq/L (ref 96–112)
GFR calc non Af Amer: 63 mL/min — ABNORMAL LOW (ref >90)
GFR, EST AFRICAN AMERICAN: 73 mL/min — AB (ref >90)
GLUCOSE: 80 mg/dL (ref 70–99)
Potassium: 5 mEq/L (ref 3.7–5.3)
SODIUM: 139 meq/L (ref 137–147)
TOTAL PROTEIN: 7.3 g/dL (ref 6.0–8.3)
Total Bilirubin: 0.4 mg/dL (ref 0.3–1.2)

## 2013-08-23 LAB — CBC WITH DIFFERENTIAL/PLATELET
Basophils Absolute: 0 10*3/uL (ref 0.0–0.1)
Basophils Relative: 0 % (ref 0–1)
EOS ABS: 0.2 10*3/uL (ref 0.0–0.7)
EOS PCT: 2 % (ref 0–5)
HCT: 45 % (ref 36.0–46.0)
Hemoglobin: 14.8 g/dL (ref 12.0–15.0)
LYMPHS ABS: 2.9 10*3/uL (ref 0.7–4.0)
Lymphocytes Relative: 35 % (ref 12–46)
MCH: 28.7 pg (ref 26.0–34.0)
MCHC: 32.9 g/dL (ref 30.0–36.0)
MCV: 87.4 fL (ref 78.0–100.0)
Monocytes Absolute: 0.8 10*3/uL (ref 0.1–1.0)
Monocytes Relative: 10 % (ref 3–12)
Neutro Abs: 4.2 10*3/uL (ref 1.7–7.7)
Neutrophils Relative %: 53 % (ref 43–77)
PLATELETS: 290 10*3/uL (ref 150–400)
RBC: 5.15 MIL/uL — AB (ref 3.87–5.11)
RDW: 16.1 % — ABNORMAL HIGH (ref 11.5–15.5)
WBC: 8.1 10*3/uL (ref 4.0–10.5)

## 2013-08-23 MED ORDER — OXYCODONE-ACETAMINOPHEN 5-325 MG PO TABS: ORAL_TABLET | ORAL | Status: DC

## 2013-08-23 NOTE — Progress Notes (Signed)
Rita Humphrey  OFFICE PROGRESS NOTE  [place], [place]  [place] [address]  DIAGNOSIS: No diagnosis found.  No chief complaint on file.   CURRENT THERAPY: Maintenance Avastin started on 03/19/2013 with last treatment on 07/15/2013. She was discontinued because of worsening CEA 125.  INTERVAL HISTORY: Rita Humphrey 73 y.o. female returns for followup after PET/CT scan was performed to restage ovarian cancer after elevation in CA 125 was noted while taking maintenance Avastin. CT scan appeared to reveal a mass in the pelvis on plain CT scan.  Neuropathy is controlled with Cymbalta. Bowel movements are regular with no melena, hematochezia, vaginal bleeding, dysuria, hematuria, incontinence, lower extremity swelling that is worsening, chest pain, PND, orthopnea, skin rash, headache, or seizures. She also denies any fevers or night sweats.   MEDICAL HISTORY: Past Medical History  Diagnosis Date  . Hypertension   . Hyperlipemia   . Thyroid disease   . Asthma   . Adnexal mass 05/16/2012  . 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 Cancer     [place]  . Breast cancer     s/p bilateral mastectomy [id number]  . Ovarian cancer 05/17/2012  . 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 Peripheral vascular disease 2013    hx of DVT right leg  . History of kidney stones   . Anemia   . Platelets decreased april 2014     1 transfusion given before chemo  . Deafness in left ear   . Numbness in feet   . Numbness of fingers of both hands   . Dizziness     occasional  . History of breast cancer 12/07/2012    INTERIM HISTORY: has HTN (hypertension); Hyperlipemia; Low back pain; Neck pain; Adnexal mass; Ovarian cancer; Omental metastasis; Peripheral neuropathy, secondary to chemotherapy; History of breast cancer; and Osteoarthritis of right knee on her problem list.   February 2014. She underwent  a CT scan of the abdomen and pelvis on March 10. It reveals a moderate amount of ascites or peritoneal cavity. There are multiple soft tissue lesions noted throughout the peritoneal cavity consistent with peritoneal implants of tumor with omental caking as well. The abdominal structures are unremarkable. The pelvis has ascites as well as multiple peritoneal implants of tumor. The prominent adnexal soft tissue masses bilaterally. On the left, a soft tissue mass in the left adnexa measures 7 x 4 cm with a right adnexal soft tissue mass measuring 3.8 x 2.4 cm. She underwent a paracentesis on March 11. Pathology revealed malignant cells consistent with carcinoma. The malignant cells are positive for cytokeratin 7 and show a morphologic and immunophenotypic finding consistent with a gynecologic primary. CA 125 has been drawn that was over 1000. Further noted, the patient has a personal history of breast cancer and has never received genetic testing.  She underwent exploratory laparotomy and peritoneal biopsies on June 05, 2012. Operative findings included 3.7 L of ascites along with diffuse carcinomatosis involving the small bowel mesentery with agglutination of the small bowel mesentery. There was complete coverage of the right hemidiaphragm with tumor. The transverse colon and omentum were diffusely replaced with tumor that was not resectable. The left upper quadrant was agglutinated with tumor and there is no large abdominal pelvic mass in the left upper quadrant. There is diffuse subcentimeter disease covering almost the entire surface of the small  bowel mesentery. The right ovary was agglutinated to the rectosigmoid colon with a thick rind of tumor connecting it. She was not deemed resectable. 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 She underwent 1 cycle of Taxotere and carboplatin based chemotherapy in the hospital.  After 3 cycles of  chemotherapy, her CA 125 went from 1097 to 54.9 on June 4. On May 27th she had a CT scan that revealed: Findings: Lung bases: Bilateral mastectomy. Clear lung bases. Normal heart size without pericardial or pleural effusion. Abdomen/pelvis: Normal liver, spleen, stomach, pancreas, gallbladder, biliary tract, adrenal glands. Left renal angiomyolipoma of 9 mm. Bilateral renal sinus cysts, without hydronephrosis. No retroperitoneal or retrocrural adenopathy. Sigmoid diverticulosis with muscular hypertrophy. Normal terminal ileum. Normal caliber of small bowel loops. Nearly completely resolved abdominal ascites. There is minimal fluid identified adjacent the spleen on image 25/series 2 and along the right lobe of the liver on image 37/series 2. Slight decrease in omental nodularity/caking. This is ill-defined, but measures on the order of 8.0 x 3.6 cm on image 45/series 2. No pelvic adenopathy. Normal urinary bladder. Similar cul-de-sac small volume fluid, minimally eccentric left. Left adnexal mass measures 4.2 x 2.7 cm versus 7.0 x 4.0 cm on the prior. Right adnexal mass of 3.5 x 2.5 cm compares with 3.8 x 2.4 cm on the prior. Bones/Musculoskeletal: Mild osteopenia. L4-S1 disc bulges. IMPRESSION: 1. Overall response to therapy, with decreased omental/peritoneal disease and nearly completely resolved ascites. 2. Decreased size of bilateral ovarian/adnexal masses. 3. Similar small volume cul-de-sac fluid. 4. Similar left renal angiomyolipoma.  She had cycle #4 of chemotherapy on June 5. She's undergone the subsequent 3 cycles with carboplatin and Taxol. Her first cycle of chemotherapy was with carboplatin and Taxotere. She had a CT scan on July 28 that revealed: The urinary bladder appears normal. Previous hysterectomy. Left adnexal soft tissue structure measures 2.2 x 4.0 cm, image 76/series 2. The right adnexal soft tissue structure measures 2.3 x 3.1 cm, image 76/series 2. Previously 2.6 x 3.5 cm. Loculated fluid  within the inferior left pelvis measures 3.5 x 2.5 cm, image 79/series 2. Previously 3.7 x 2.7 cm. Omental tumor is identified measuring 7.5 x 3.0 cm, image 43/series 2. Previously 7.9 x 3.3 cm. The abdominal aorta has a normal caliber. No aneurysm. No upper abdominal adenopathy identified. There is no enlarged pelvic or inguinal adenopathy identified. The stomach is normal. The small bowel loops are within normal limits. No evidence for small bowel obstruction. Normal caliber of the colon multiple distal colonic diverticula identified without acute inflammation. Review of the visualized bony structures is significant for mild lumbar spondylosis. No aggressive lytic or sclerotic bone lesions. IMPRESSION: 1. No acute findings. 2. Lateral mild improvement and peritoneal disease. No new or progressive disease identified. 3. No evidence for obstructive uropathy or bowel obstruction. Her CA 125 is normalized at 20.1.  On 10/30/12, she underwent a diagnostic laparoscopy, exploratory laparotomy, bilateral salpingo-oophorectomy, total omentectomy, and radical tumor debulking along with mobilization of splenic and hepatic flexures by Dr. Nancy Marus. Final pathology revealed: 1. Adnexa - ovary +/- tube, neoplastic, left - HIGH GRADE CARCINOMA, 2.3 CM, INVOLVING THE ADJACENT FALLOPIAN TUBE. - PLEASE SEE ONCOLOGY TEMPLATE FOR DETAILS. 2. 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 3. Omentum, resection for tumor - METASTATIC HIGH GRADE  CARCINOMA, 18.4 CM.    Ovarian cancer    05/15/2012  Initial Diagnosis  [facility] ovarian cancer    06/05/2012  Surgery  suboptimal debulking    06/07/2012 - 06/07/2012  Chemotherapy  Carboplatin/Docetaxel as inpatient at [facility] by Dr. Beryle Beams. 1 cycle.    06/28/2012 - 08/09/2012  Chemotherapy  Carboplatin/Paclitaxel x 3 cycles     08/30/2012 - 09/20/2012  Chemotherapy  Carboplatin/Abraxane x 2 cycles (due to increasing peripheral neuropathy)    10/30/2012  Surgery  exploratory laparotomy, optimal debulking. Tumor sent to [facility]    10/31/2012  Remission     12/17/2012 - 02/20/2013  Chemotherapy  Etoposide x 4 cycles    03/19/2013 -  Chemotherapy  Avastin maintenance   07/15/2013 CA125=103.6 CT-R pelvic mass  ALLERGIES:  is allergic to gabapentin; penicillins; carboplatin; and morphine and related.  MEDICATIONS: has a current medication list which includes the following prescription(s): amlodipine, benzonatate, vitamin d3, docusate calcium, duloxetine, lidocaine-prilocaine, ondansetron, oxycodone-acetaminophen, potassium chloride sa, prochlorperazine, temazepam, and tetrahydroz-dextran-peg-povid, and the following Facility-Administered Medications: sodium chloride.  SURGICAL HISTORY:  Past Surgical History  Procedure Laterality Date  . Thyroid lobectomy    . Foot surgery      2 spurs taken out of foot  . Varicose vein surgery    . Bladder tack surgery     . Tonsillectomy    . Mastectomy      bilateral   . Laparotomy Bilateral 06/05/2012    Procedure: EXPLORATORY LAPAROTOMY WITH PELVIC TUMOR BIOPSY;  Surgeon: Imagene Gurney A. Alycia Rossetti, MD;  Location: [facility];  Service: Gynecology;  Laterality: Bilateral;  . Abdominal hysterectomy  1980  . Vascular surgery      veins stripped from both legs  . Portacath placement Left 06/25/2012    Procedure: INSERTION PORT-A-CATH;  Surgeon: Jamesetta So, MD;  Location: [facility];  Service: General;  Laterality: Left;  . Laparoscopy N/A 10/30/2012    Procedure: LAPAROSCOPY DIAGNOSTIC, EXPLORATORY LAPAROTOMY, BILATERAL SALPINGO-OOPHORECTOMY, TUMOR DEBULKING;  Surgeon: Imagene Gurney A. Alycia Rossetti, MD;  Location: [facility];  Service: Gynecology;  Laterality: N/A;    FAMILY HISTORY: family history includes Cancer in her mother; Heart attack in her brother; Hypertension in her father and  sister.  SOCIAL HISTORY:  reports that she has never smoked. She has never used smokeless tobacco. She reports that she does not drink alcohol or use illicit drugs.  REVIEW OF SYSTEMS:  Other than that discussed above is noncontributory.  PHYSICAL EXAMINATION: ECOG PERFORMANCE STATUS: 1 - Symptomatic but completely ambulatory  There were no vitals taken for this visit.  GENERAL:alert, no distress and comfortable. Complete resolution of alopecia. SKIN: skin color, texture, turgor are normal, no rashes or significant lesions EYES: PERLA; Conjunctiva are pink and non-injected, sclera clear SINUSES: No redness or tenderness over maxillary or ethmoid sinuses OROPHARYNX:no exudate, no erythema on lips, buccal mucosa, or tongue. NECK: supple, thyroid normal size, non-tender, without nodularity. No masses CHEST: LifePort in place. I LYMPH:  no palpable lymphadenopathy in the cervical, axillary or inguinal LUNGS: clear to auscultation and percussion with normal breathing effort HEART: regular rate & rhythm and no murmurs. ABDOMEN:abdomen soft, non-tender and normal bowel sounds MUSCULOSKELETAL:no cyanosis of digits and no clubbing. Range of motion normal.  NEURO: alert & oriented x 3 with fluent speech, no focal motor/sensory deficits   LABORATORY DATA:  Results for STORMEY, WILBORN (MRN [id number]) as of 08/23/2013 10:56  Ref. Range 05/06/2013 12:15 05/27/2013 12:00 06/24/2013 10:33 07/15/2013 10:10 08/05/2013 11:15  CA  125 Latest Range: 0.0-30.2 U/mL 20.1 39.5 (H) 53.3 (H) 103.6 (H) 260.1 (H)     Hospital Outpatient Visit on 08/14/2013  Component Date Value Ref Range Status  . Glucose-Capillary 08/14/2013 101* 70 - 99 mg/dL Final  Infusion on 08/05/2013  Component Date Value Ref Range Status  . Specific Gravity, Urine 08/05/2013 >1.030* 1.005 - 1.030 Final  . pH 08/05/2013 5.5  5.0 - 8.0 Final  . Glucose, UA 08/05/2013 NEGATIVE  NEGATIVE mg/dL Final  . Hgb urine dipstick 08/05/2013 NEGATIVE   NEGATIVE Final  . Bilirubin Urine 08/05/2013 NEGATIVE  NEGATIVE Final  . Ketones, ur 08/05/2013 NEGATIVE  NEGATIVE mg/dL Final  . Protein, ur 08/05/2013 TRACE* NEGATIVE mg/dL Final  . Urobilinogen, UA 08/05/2013 0.2  0.0 - 1.0 mg/dL Final  . Nitrite 08/05/2013 NEGATIVE  NEGATIVE Final  . Leukocytes, UA 08/05/2013 SMALL* NEGATIVE Final  . WBC 08/05/2013 7.0  4.0 - 10.5 K/uL Final  . RBC 08/05/2013 5.06  3.87 - 5.11 MIL/uL Final  . Hemoglobin 08/05/2013 13.9  12.0 - 15.0 g/dL Final  . HCT 08/05/2013 43.2  36.0 - 46.0 % Final  . MCV 08/05/2013 85.4  78.0 - 100.0 fL Final  . MCH 08/05/2013 27.5  26.0 - 34.0 pg Final  . MCHC 08/05/2013 32.2  30.0 - 36.0 g/dL Final  . RDW 08/05/2013 16.0* 11.5 - 15.5 % Final  . Platelets 08/05/2013 277  150 - 400 K/uL Final  . Neutrophils Relative % 08/05/2013 53  43 - 77 % Final  . Neutro Abs 08/05/2013 3.7  1.7 - 7.7 K/uL Final  . Lymphocytes Relative 08/05/2013 37  12 - 46 % Final  . Lymphs Abs 08/05/2013 2.6  0.7 - 4.0 K/uL Final  . Monocytes Relative 08/05/2013 8  3 - 12 % Final  . Monocytes Absolute 08/05/2013 0.6  0.1 - 1.0 K/uL Final  . Eosinophils Relative 08/05/2013 2  0 - 5 % Final  . Eosinophils Absolute 08/05/2013 0.2  0.0 - 0.7 K/uL Final  . Basophils Relative 08/05/2013 0  0 - 1 % Final  . Basophils Absolute 08/05/2013 0.0  0.0 - 0.1 K/uL Final  . Sodium 08/05/2013 140  137 - 147 mEq/L Final  . Potassium 08/05/2013 4.6  3.7 - 5.3 mEq/L Final  . Chloride 08/05/2013 103  96 - 112 mEq/L Final  . CO2 08/05/2013 24  19 - 32 mEq/L Final  . Glucose, Bld 08/05/2013 102* 70 - 99 mg/dL Final  . BUN 08/05/2013 19  6 - 23 mg/dL Final  . Creatinine, Ser 08/05/2013 0.82  0.50 - 1.10 mg/dL Final  . Calcium 08/05/2013 9.8  8.4 - 10.5 mg/dL Final  . Total Protein 08/05/2013 7.4  6.0 - 8.3 g/dL Final  . Albumin 08/05/2013 3.8  3.5 - 5.2 g/dL Final  . AST 08/05/2013 21  0 - 37 U/L Final  . ALT 08/05/2013 23  0 - 35 U/L Final  . Alkaline Phosphatase  08/05/2013 105  39 - 117 U/L Final  . Total Bilirubin 08/05/2013 0.6  0.3 - 1.2 mg/dL Final  . GFR calc non Af Amer 08/05/2013 69* >90 mL/min Final  . GFR calc Af Amer 08/05/2013 80* >90 mL/min Final   Comment: (NOTE)                          The eGFR has been calculated using the CKD EPI equation.  This calculation has not been validated in all clinical situations.                          eGFR's persistently <90 mL/min signify possible Chronic Kidney                          Disease.  . CA 125 08/05/2013 260.1* 0.0 - 30.2 U/mL Final   Performed at [place]: No new pathology  Last [facility] evaluation indicated sensitivity to etoposide.  Urinalysis    Component Value Date/Time   COLORURINE YELLOW 07/03/2012 1158   APPEARANCEUR CLEAR 07/03/2012 1158   LABSPEC >1.030* 08/05/2013 0946   PHURINE 5.5 08/05/2013 0946   GLUCOSEU NEGATIVE 08/05/2013 0946   HGBUR NEGATIVE 08/05/2013 0946   BILIRUBINUR NEGATIVE 08/05/2013 0946   KETONESUR NEGATIVE 08/05/2013 0946   PROTEINUR TRACE* 08/05/2013 0946   UROBILINOGEN 0.2 08/05/2013 0946   NITRITE NEGATIVE 08/05/2013 0946   LEUKOCYTESUR SMALL* 08/05/2013 0946    RADIOGRAPHIC STUDIES: Nm Pet Image Restag (ps) Skull Base To Thigh  08/14/2013   CLINICAL DATA:  Subsequent treatment strategy for ovarian cancer. Rita Humphrey 73 y.o. female returns for followup while receiving maintenance Avastin for peritoneal carcinomatosis with treatment started on 03/19/2013 after having received carboplatin/docetaxel, carboplatin/paclitaxel, and carboplatin/Abraxane followed by 4 cycles of etoposide albeit after debulking surgery on 06/05/2012. She was started on Avastin maintenance on 03/19/2013.  EXAM: NUCLEAR MEDICINE PET SKULL BASE TO THIGH  TECHNIQUE: 10.4 mCi F-18 FDG was injected intravenously. Full-ring PET imaging was performed from the skull base to thigh after the radiotracer. CT data was obtained and used for attenuation  correction and anatomic localization.  FASTING BLOOD GLUCOSE:  Value: 101 mg/dl  COMPARISON:  PET-CT 03/05/2013.  Diagnostic CTs 07/04/2013.  FINDINGS: NECK  No hypermetabolic cervical lymph nodes are identified.There are no lesions of the pharyngeal mucosal space.  CHEST  There are no hypermetabolic mediastinal, hilar or axillary lymph nodes. There is no hypermetabolic pulmonary activity. There are stable surgical clips in the right axilla, a stable left subclavian Port-A-Cath and azygos fissure. Mild emphysematous changes within the lungs are stable.  ABDOMEN/PELVIS  There is no hypermetabolic activity within the liver, adrenal glands, spleen or pancreas. There is no hypermetabolic nodal activity. There is no abnormal activity within the right pelvis to correspond with the soft tissue nodule questioned on the recent pelvic CT (image 169). There is no suspicious omental nodularity or ascites. Hepatic steatosis, bilateral renal parapelvic cysts, left renal angiomyolipoma, sigmoid diverticulosis and postsurgical changes within the anterior abdominal wall are stable.  SKELETON  There is no hypermetabolic activity to suggest osseous metastatic disease.  IMPRESSION: Stable examination without evidence of recurrent ovarian cancer. Incidental findings described above are stable.   Electronically Signed   By: Camie Patience M.D.   On: 08/14/2013 15:03    ASSESSMENT:  #1. Stage TIII-C. ovarian cancer,  worsening CA 125 with normal PET scan, apparent failure of Avastin maintenance. #2.Peripheral neuropathy from previous chemotherapy, improved on Cymbalta with persistent involvement of lower extremities only.  3. History of allergy to carboplatin and with the emergence of neuropathy, VP-16 was chosen as treatment after debulking surgery preceded by 6 cycles of carboplatin and Taxol.  4. Hypertension, controlled  5. Degenerative joint disease of the right knee, status post injection with excellent relief.     PLAN:   #1. A discussion was  held with the patient, her daughter, and her other daughter on the telephone. Is worsening CA 125 indicative of recurrent disease on a cellular level not detectable by PET scan? #2. Refer to Dr. Alycia Rossetti for possible laparoscopic evaluation and peritoneal washings to document recurrent disease with cell block ultimately sent for [facility] reanalysis. There exists contraindications to using carboplatin or cisplatin because of allergic reaction in the past and also a contraindication to using Taxol or any taxanes because of peripheral neuropathy. #3. Appointment with Dr. Alycia Rossetti on 09/05/2013 for consideration of diagnostic laparoscopy and peritoneal lavage. #4. Tentative followup here on 09/10/2013 with CBC, chem profile, and CA 125.   All questions were answered. The patient knows to call the clinic with any problems, questions or concerns. We can certainly see the patient much sooner if necessary.   I spent 40 minutes counseling the patient face to face. The total time spent in the appointment was 55 minutes.    Doroteo Bradford, MD 08/23/2013 10:37 AM  DISCLAIMER:  This note was dictated with voice recognition software.  Similar sounding words can inadvertently be transcribed inaccurately and may not be corrected upon review.

## 2013-08-23 NOTE — Patient Instructions (Signed)
[place] Discharge Instructions  RECOMMENDATIONS MADE BY THE CONSULTANT AND ANY TEST RESULTS WILL BE SENT TO YOUR REFERRING PHYSICIAN.  EXAM FINDINGS BY THE PHYSICIAN TODAY AND SIGNS OR SYMPTOMS TO REPORT TO CLINIC OR PRIMARY PHYSICIAN: Exam and findings as discussed by Dr. Barnet Glasgow.  MD recommends that you see Dr. Alycia Rossetti as scheduled and that you have an diagnostic laparoscopic  evaluation to determine what is going on.  Your tumor marker is more elevated and MD feels like something is going on.  MEDICATIONS PRESCRIBED:  none  INSTRUCTIONS/FOLLOW-UP: Follow-up after seeing Dr. Alycia Rossetti.  Thank you for choosing [facility] to provide your oncology and hematology care.  To afford each patient quality time with our providers, please arrive at least 15 minutes before your scheduled appointment time.  With your help, our goal is to use those 15 minutes to complete the necessary work-up to ensure our physicians have the information they need to help with your evaluation and healthcare recommendations.    Effective January 1st, 2014, we ask that you re-schedule your appointment with our physicians should you arrive 10 or more minutes late for your appointment.  We strive to give you quality time with our providers, and arriving late affects you and other patients whose appointments are after yours.    Again, thank you for choosing [facility].  Our hope is that these requests will decrease the amount of time that you wait before being seen by our physicians.       _____________________________________________________________  Should you have questions after your visit to [facility], please contact our office at [phone] between the hours of 8:30 a.m. and 5:00 p.m.  Voicemails left after 4:30 p.m. will not be returned until the following business day.  For prescription refill requests, have your pharmacy contact our office with your  prescription refill request.

## 2013-08-23 NOTE — Progress Notes (Signed)
LABS DRAWN FOR CBCD, CMP, CA125.

## 2013-08-24 LAB — CA 125: CA 125: 524.6 U/mL — AB (ref 0.0–30.2)

## 2013-08-26 ENCOUNTER — Telehealth (HOSPITAL_COMMUNITY): Payer: Self-pay

## 2013-09-05 ENCOUNTER — Ambulatory Visit: Payer: Medicare HMO | Attending: Gynecologic Oncology | Admitting: Gynecologic Oncology

## 2013-09-05 ENCOUNTER — Encounter: Payer: Self-pay | Admitting: Gynecologic Oncology

## 2013-09-05 ENCOUNTER — Other Ambulatory Visit (INDEPENDENT_AMBULATORY_CARE_PROVIDER_SITE_OTHER): Payer: Self-pay | Admitting: Gynecologic Oncology

## 2013-09-05 VITALS — BP 131/97 | HR 84 | Temp 98.1°F | Resp 18 | Ht 63.0 in | Wt 193.2 lb

## 2013-09-05 DIAGNOSIS — C786 Secondary malignant neoplasm of retroperitoneum and peritoneum: Secondary | ICD-10-CM

## 2013-09-05 DIAGNOSIS — E785 Hyperlipidemia, unspecified: Secondary | ICD-10-CM | POA: Insufficient documentation

## 2013-09-05 DIAGNOSIS — Z86718 Personal history of other venous thrombosis and embolism: Secondary | ICD-10-CM

## 2013-09-05 DIAGNOSIS — C569 Malignant neoplasm of unspecified ovary: Secondary | ICD-10-CM | POA: Diagnosis not present

## 2013-09-05 DIAGNOSIS — Z9010 Acquired absence of unspecified breast and nipple: Secondary | ICD-10-CM | POA: Insufficient documentation

## 2013-09-05 DIAGNOSIS — Z853 Personal history of malignant neoplasm of breast: Secondary | ICD-10-CM | POA: Insufficient documentation

## 2013-09-05 DIAGNOSIS — J45909 Unspecified asthma, uncomplicated: Secondary | ICD-10-CM | POA: Insufficient documentation

## 2013-09-05 DIAGNOSIS — Z79899 Other long term (current) drug therapy: Secondary | ICD-10-CM | POA: Insufficient documentation

## 2013-09-05 DIAGNOSIS — I10 Essential (primary) hypertension: Secondary | ICD-10-CM | POA: Insufficient documentation

## 2013-09-05 DIAGNOSIS — Z9221 Personal history of antineoplastic chemotherapy: Secondary | ICD-10-CM | POA: Insufficient documentation

## 2013-09-05 NOTE — Patient Instructions (Signed)
We will call you after your PET scan.  BRCA-1 and BRCA-2 BRCA-1 and BRCA-2 are 2 genes that are linked with hereditary breast and ovarian cancers. About 200,000 women are diagnosed with invasive breast cancer each year and about 23,000 with ovarian cancer (according to the [place]). Of these cancers, about 5% to 10% will be due to a mutation in one of the BRCA genes. Men can also inherit an increased risk of developing breast cancer, primarily from an alteration in the BRCA-2 gene.  Individuals with mutations in BRCA1 or BRCA2 have significantly elevated risks for breast cancer (up to 80% lifetime risk), ovarian cancer (up to 40% lifetime risk), bilateral breast cancer and other types of cancers. BRCA mutations are inherited and passed from generation to generation. One half of the time, they are passed from the father's side of the family.  The DNA in white blood cells is used to detect mutations in the BRCA genes. While the gene products (proteins) of the BRCA genes act only in breast and ovarian tissue, the genes are present in every cell of the body and blood is the most easily accessible source of that DNA. PREPARATION FOR TEST The test for BRCA mutations is done on a blood sample collected by needle from a vein in the arm. The test does not require surgical biopsy of breast or ovarian tissue.  NORMAL FINDINGS No genetic mutations. Ranges for normal findings may vary among different laboratories and hospitals. You should always check with your doctor after having lab work or other tests done to discuss the meaning of your test results and whether your values are considered within normal limits. MEANING OF TEST  Your caregiver will go over the test results with you and discuss the importance and meaning of your results, as well as treatment options and the need for additional tests if necessary. OBTAINING THE TEST RESULTS It is your responsibility to obtain your test results. Ask  the lab or department performing the test when and how you will get your results. OTHER THINGS TO KNOW Your test results may have implications for other family members. When one member of a family is tested for BRCA mutations, issues often arise about how or whether to share this information with other family members. Seek advice from a genetic counselor about communication of result with your family members.  Pre and post test consultation with a health care provider knowledgeable about genetic testing cannot be overemphasized.  There are many issues to be considered when preparing for a genetic test and upon learning the results, and a genetic counselor has the knowledge and experience to help you sort through them.  If the BRCA test is positive, the options include increased frequency of check-ups (e.g., mammography, blood tests for CA-125, or transvaginal ultrasonography); medications that could reduce risk (e.g., oral contraceptives or tamoxifen); or surgical removal of the ovaries or breasts. There are a number of variables involved and it is important to discuss your options with your doctor and genetic counselor. Research studies have reported that for every 1000 women negative for BRCA mutations, between 12 and 11 of them will develop breast cancer by age 50 and between 37 and 85 will develop ovarian cancer by age 32. The risk increases with age. The test can be ordered by a doctor, preferably by one who can also offer genetic counseling. The blood sample will be sent to a laboratory that specializes in BRCA testing. The American Society of Clinical Oncology and  the [place] encourage women seeking the test to participate in long-term outcome studies to help gather information on the effectiveness of different check-up and treatment options. Document Released: 03/17/2004 Document Revised: 05/16/2011 Document Reviewed: 01/28/2008 [facility] Patient Information 2015 [place],  [place]. This information is not intended to replace advice given to you by your health care provider. Make sure you discuss any questions you have with your health care provider.

## 2013-09-05 NOTE — Progress Notes (Signed)
Consult Note: Gyn-Onc  Rita Humphrey 73 y.o. female  CC:  Chief Complaint  Patient presents with  . Ovarian Cancer    follow up     HPI: Rita Humphrey is a 73 year old, gravida 3 para 3, who had been experiencing abdominal pain since November of 2013. Since December, she had lost approximately 20 pounds and during that time, also noted early satiety and not be able to wear her clothes. She did not seek medical attention until February 2014. She underwent a CT scan of the abdomen and pelvis on March 10. It reveals a moderate amount of ascites or peritoneal cavity. There are multiple soft tissue lesions noted throughout the peritoneal cavity consistent with peritoneal implants of tumor with omental caking as well. The abdominal structures are unremarkable. The pelvis has ascites as well as multiple peritoneal implants of tumor. The prominent adnexal soft tissue masses bilaterally. On the left, a soft tissue mass in the left adnexa measures 7 x 4 cm with a right adnexal soft tissue mass measuring 3.8 x 2.4 cm. She underwent a paracentesis on March 11. Pathology revealed malignant cells consistent with carcinoma. The malignant cells are positive for cytokeratin 7 and show a morphologic and immunophenotypic finding consistent with a gynecologic primary. CA 125 has been drawn that was over 1000. Further noted, the patient has a personal history of breast cancer and has never received genetic testing.   She underwent exploratory laparotomy and peritoneal biopsies on June 05, 2012. Operative findings included 3.7 L of ascites along with diffuse carcinomatosis involving the small bowel mesentery with agglutination of the small bowel mesentery. There was complete coverage of the right hemidiaphragm with tumor. The transverse colon and omentum were diffusely replaced with tumor that was not resectable. The left upper quadrant was agglutinated with tumor and there is no large abdominal pelvic mass in the left upper  quadrant. There is diffuse subcentimeter disease covering almost the entire surface of the small bowel mesentery. The right ovary was agglutinated to the rectosigmoid colon with a thick rind of tumor connecting it. She was not deemed resectable. 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 She underwent 1 cycle of Taxotere and carboplatin based chemotherapy in the hospital.   After 3 cycles of chemotherapy, her CA 125 went from 1097 to 54.9 on June 4. On May 27th she had a CT scan that revealed: Findings: Lung bases: Bilateral mastectomy. Clear lung bases. Normal heart size without pericardial or pleural effusion. Abdomen/pelvis: Normal liver, spleen, stomach, pancreas, gallbladder, biliary tract, adrenal glands. Left renal angiomyolipoma of 9 mm. Bilateral renal sinus cysts, without hydronephrosis. No retroperitoneal or retrocrural adenopathy. Sigmoid diverticulosis with muscular hypertrophy. Normal terminal ileum. Normal caliber of small bowel loops. Nearly completely resolved abdominal ascites. There is minimal fluid identified adjacent the spleen on image 25/series 2 and along the right lobe of the liver on image 37/series 2. Slight decrease in omental nodularity/caking. This is ill-defined, but measures on the order of 8.0 x 3.6 cm on image 45/series 2. No pelvic adenopathy. Normal urinary bladder. Similar cul-de-sac small volume fluid, minimally eccentric left. Left adnexal mass measures 4.2 x 2.7 cm versus 7.0 x 4.0 cm on the prior. Right adnexal mass of 3.5 x 2.5 cm compares with 3.8 x 2.4 cm on the prior. Bones/Musculoskeletal: Mild osteopenia. L4-S1 disc bulges. IMPRESSION: 1. Overall response to therapy, with decreased omental/peritoneal disease and nearly completely resolved ascites. 2. Decreased size of bilateral  ovarian/adnexal masses. 3. Similar small volume cul-de-sac fluid. 4. Similar left renal angiomyolipoma.   She had  cycle #4 of chemotherapy on June 5. She's undergone the subsequent 3 cycles with carboplatin and Taxol. Her first cycle of chemotherapy was with carboplatin and Taxotere. She had a CT scan on July 28 that revealed: The urinary bladder appears normal. Previous hysterectomy. Left adnexal soft tissue structure measures 2.2 x 4.0 cm, image 76/series 2. The right adnexal soft tissue structure measures 2.3 x 3.1 cm, image 76/series 2. Previously 2.6 x 3.5 cm. Loculated fluid within the inferior left pelvis measures 3.5 x 2.5 cm, image 79/series 2. Previously 3.7 x 2.7 cm. Omental tumor is identified measuring 7.5 x 3.0 cm, image 43/series 2. Previously 7.9 x 3.3 cm. The abdominal aorta has a normal caliber. No aneurysm. No upper abdominal adenopathy identified. There is no enlarged pelvic or inguinal adenopathy identified. The stomach is normal. The small bowel loops are within normal limits. No evidence for small bowel obstruction. Normal caliber of the colon multiple distal colonic diverticula identified without acute inflammation. Review of the visualized bony structures is significant for mild lumbar spondylosis. No aggressive lytic or sclerotic bone lesions. IMPRESSION: 1. No acute findings. 2. Lateral mild improvement and peritoneal disease. No new or progressive disease identified. 3. No evidence for obstructive uropathy or bowel obstruction. Her CA 125 is normalized at 20.1.    On 10/30/12, she underwent a diagnostic laparoscopy, exploratory laparotomy, bilateral salpingo-oophorectomy, total omentectomy, and radical tumor debulking along with mobilization of splenic and hepatic flexures by Dr. Nancy Marus. Final pathology revealed: 1. Adnexa - ovary +/- tube, neoplastic, left - HIGH GRADE CARCINOMA, 2.3 CM, INVOLVING THE ADJACENT FALLOPIAN TUBE. - PLEASE SEE ONCOLOGY TEMPLATE FOR DETAILS. 2. 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 3. 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  I have not seen her since September 2014. She did have chemoresistance testing that showed responsiveness to carboplatin, cisplatin, docetaxel, etoposide and paclitaxel. She had intermediate response to doxorubicin, ifosfamide, Topotecan, and doublet therapy with carboplatin and Doxil, docetaxel and carboplatin gemcitabine, and carboplatin and paclitaxel. She was nonresponsive to gemcitabine. She was treated with 4 cycles of etoposide. After her chemotherapy she was then placed on maintenance Avastin. In March of 2015 her CA-125 was 39.5. In those consistently into her reach 524.6 on 08/24/2013. She had a CT scan on April 30 that revealed:  FINDINGS:  CT CHEST FINDINGS BILATERAL peripelvic renal cysts. Small angiomyolipoma posterior LEFT kidney 10 x 10 mm image 22. Remainder of liver, spleen, pancreas, kidneys, and adrenal glands normal appearance. Thoracic vascular structures patent on nondedicated exam. Azygos fissure noted. Surgical clips RIGHT axilla. No mediastinal, hilar, or axillary adenopathy. Few calcified pulmonary granulomata. Additional tiny nonspecific nodular foci in RIGHT lung are stable, not definitively calcified. No pulmonary infiltrate, pleural effusion, pneumothorax, or acute osseous findings. Bones demineralized.  CT ABDOMEN AND PELVIS FINDINGS Mild fatty infiltration of liver. BILATERAL peripelvic renal cysts. Small angiomyolipoma posterior LEFT kidney 10 x 10 mm, stable. Sigmoid diverticulosis without evidence of diverticulitis. Small supraumbilicalventral hernia containing a small portion of the mid transverse colon as a Richter hernia. No associated bowel wall thickening or evidence of obstruction.  No omental or mesenteric tumor nodularity or infiltration. Again identified small amount of fluid or thickening at the ventral  abdominal  surgical wound/scar approximately 2.2 x 1.6 x 3.5 cm. Mild protrusion of infraumbilical midline fascia but grossly intact without herniation. No mass, adenopathy, free fluid, or free air. Specifically, no gastrohepatic ligament adenopathy. Uterus surgically absent with nonvisualization of ovaries. Slightly decreased prominence of LEFT pelvic soft tissue density seen previously. Area of soft tissue density in the RIGHT pelvis 2.2 x 0.9 cm image 108, minimally more prominent though uncertain if represents residual tumor or slightly increased scarring. Bladder and ureters unremarkable. No acute osseous findings, with tiny bone island at L1 stable.  IMPRESSION:  -No acute intra thoracic abnormalities.  -Few tiny calcified and noncalcified pulmonary nodules stable since previous exam.  -ILATERAL parapelvic renal cysts and small LEFT renal angiomyolipoma.  -Small supraumbilical Richter hernia containing a small portion of the mid transverse colon without bowel wall thickening or obstruction.  -Resolution of omental and mesenteric tumor infiltration since 10/01/2012.  -Areas soft tissue in the RIGHT pelvis is minimal or prominent than on the previous exam, approximately 2.2 x 0.9 cm, uncertain if represents progressive scarring, difficult to exclude residual tumor though this the demonstrated no FDG localization on PET-CT ; recommend attention on followup exams.  She had a PET scan on June 10 that revealed: FINDINGS:  NECK  No hypermetabolic cervical lymph nodes are identified.There are no lesions of the pharyngeal mucosal space.  CHEST  There are no hypermetabolic mediastinal, hilar or axillary lymph nodes. There is no hypermetabolic pulmonary activity. There are stable surgical clips in the right axilla, a stable left subclavian Port-A-Cath and azygos fissure. Mild emphysematous changes within the lungs are stable.  ABDOMEN/PELVIS  There is no hypermetabolic activity within the liver, adrenal  glands, spleen or pancreas. There is no hypermetabolic nodal activity. There is no abnormal activity within the right pelvis to correspond with the soft tissue nodule questioned on the recent pelvic CT (image 169). There is no suspicious omental nodularity or ascites. Hepatic steatosis, bilateral renal parapelvic cysts, left renal angiomyolipoma, sigmoid diverticulosis and postsurgical changes within the anterior abdominal wall are stable.  SKELETON  There is no hypermetabolic activity to suggest osseous metastatic disease.  IMPRESSION:  Stable examination without evidence of recurrent ovarian cancer. Incidental findings described above are stable.  She was referred back to [place] for consideration of possible diagnostic laparoscopy to evaluate this area in the right lower quadrant and then sending the tumor for genetic molecular profiling.  The patient comes in accompanied with her daughter today. She's overall doing quite well and really has no significant complaints with the exception of pain in her back, right knee, and neuropathy.  Review of Systems  Constitutional:   Denies fever. Skin: No rash Cardiovascular: No chest pain, shortness of breath, or edema  Pulmonary: No cough  Gastro Intestinal: No nausea, vomiting, constipation, or diarrhea reported. No bright red blood per rectum or change in bowel movement.  Genitourinary: No frequency Musculoskeletal: No myalgia, +arthralgia,+ joint swelling, + pain.  Neurologic: No weakness, + numbness Psychology: No changes   Current Meds:  Outpatient Encounter Prescriptions as of 09/05/2013  Medication Sig  . amLODipine (NORVASC) 10 MG tablet Take 10 mg by mouth daily before breakfast.   . DULoxetine (CYMBALTA) 30 MG capsule Take two capsules at bedtime for neuropathy symptoms or as directed.  . lidocaine-prilocaine (EMLA) cream Apply a quarter size amount to port site 1 hour prior to chemo. Do not rub in. Cover with plastic wrap.  .  oxyCODONE-acetaminophen (PERCOCET/ROXICET) 5-325 MG per tablet Take 1 or 2  tablets every 4 hours as needed for pain  . potassium chloride SA (K-DUR,KLOR-CON) 20 MEQ tablet Take 1 tablet (20 mEq total) by mouth daily.  . prochlorperazine (COMPAZINE) 10 MG tablet Take 1 tablet (10 mg total) by mouth every 6 (six) hours as needed.  . benzonatate (TESSALON) 100 MG capsule 100 mg as needed.   . Cholecalciferol (VITAMIN D3) 3000 UNITS TABS Take 2,000 Units by mouth daily.  Mariane Baumgarten Calcium (STOOL SOFTENER PO) Take 1 capsule by mouth daily.  . ondansetron (ZOFRAN) 8 MG tablet Take 8 mg by mouth as needed.  . temazepam (RESTORIL) 15 MG capsule Take 15 mg by mouth at bedtime as needed for sleep.  Sallye Lat (VISINE ADVANCED RELIEF) 0.05-0.1-1-1 % SOLN Place 1 drop into both eyes daily as needed.    Allergy:  Allergies  Allergen Reactions  . Gabapentin Diarrhea  . Penicillins Anaphylaxis and Nausea And Vomiting  . Carboplatin   . Morphine And Related Nausea And Vomiting    Social Hx:   History   Social History  . Marital Status: Widowed    Spouse Name: N/A    Number of Children: N/A  . Years of Education: N/A   Occupational History  . Not on file.   Social History Main Topics  . Smoking status: Never Smoker   . Smokeless tobacco: Never Used  . Alcohol Use: No  . Drug Use: No  . Sexual Activity: No   Other Topics Concern  . Not on file   Social History Narrative  . No narrative on file    Past Surgical Hx:  Past Surgical History  Procedure Laterality Date  . Thyroid lobectomy    . Foot surgery      2 spurs taken out of foot  . Varicose vein surgery    . Bladder tack surgery     . Tonsillectomy    . Mastectomy      bilateral   . Laparotomy Bilateral 06/05/2012    Procedure: EXPLORATORY LAPAROTOMY WITH PELVIC TUMOR BIOPSY;  Surgeon: Imagene Gurney A. Alycia Rossetti, MD;  Location: [facility];  Service: Gynecology;  Laterality: Bilateral;  . Abdominal hysterectomy  1980   . Vascular surgery      veins stripped from both legs  . Portacath placement Left 06/25/2012    Procedure: INSERTION PORT-A-CATH;  Surgeon: Jamesetta So, MD;  Location: [facility];  Service: General;  Laterality: Left;  . Laparoscopy N/A 10/30/2012    Procedure: LAPAROSCOPY DIAGNOSTIC, EXPLORATORY LAPAROTOMY, BILATERAL SALPINGO-OOPHORECTOMY, TUMOR DEBULKING;  Surgeon: Imagene Gurney A. Alycia Rossetti, MD;  Location: [facility];  Service: Gynecology;  Laterality: N/A;    Past Medical Hx:  Past Medical History  Diagnosis Date  . Hypertension   . Hyperlipemia   . Thyroid disease   . Asthma   . Adnexal mass 05/16/2012  . 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 Cancer     [place]  . Breast cancer     s/p bilateral mastectomy 8413,2440  . Ovarian cancer 05/17/2012  . 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 Peripheral vascular disease 2013    hx of DVT right leg  . History of kidney stones   . Anemia   . Platelets decreased april 2014     1 transfusion given before chemo  . Deafness in left ear   . Numbness in feet   . Numbness of fingers of both hands   . Dizziness     occasional  .  History of breast cancer 12/07/2012    Oncology Hx:    Ovarian cancer   05/15/2012 Initial Diagnosis [facility] ovarian cancer   06/05/2012 Surgery suboptimal debulking   06/07/2012 - 06/07/2012 Chemotherapy Carboplatin/Docetaxel as inpatient at [facility] by Dr. Beryle Beams.  1 cycle.   06/28/2012 - 08/09/2012 Chemotherapy Carboplatin/Paclitaxel x 3 cycles   08/30/2012 - 09/20/2012 Chemotherapy Carboplatin/Abraxane x 2 cycles (due to increasing peripheral neuropathy)   10/30/2012 Surgery exploratory laparotomy, optimal debulking. Tumor sent to [facility]   10/31/2012 Remission    12/17/2012 - 02/20/2013 Chemotherapy Etoposide x 4 cycles   03/19/2013 - 07/15/2013 Chemotherapy Avastin maintenance   08/24/2013 Progression Increasing CA-125, negative PET scan    Family Hx:   Family History  Problem Relation Age of Onset  . Cancer Mother     throat cancer  . Hypertension Father   . Heart attack Brother     5 brothers  . Hypertension Sister     Vitals:  Blood pressure 131/97, pulse 84, temperature 98.1 F (36.7 C), temperature source Oral, resp. rate 18, height 5' 3"  (1.6 m), weight 193 lb 3.2 oz (87.635 kg).  Physical Exam: Well-nourished well-developed female in no acute distress.  Neck: Supple, no lymphadenopathy, no thyromegaly.  Lungs: Clear to auscultation bilaterally  Cardiovascular: Regular rate rhythm  Abdomen: Well-healed vertical midline incision. Soft, tender with deep palpation in the left lower quadrant. Distractible. No fluid wave. No hepatomegaly. No palpable masses.  Groins: No lymphadenopathy.  Extremities: 1+ nonpitting edema equal bilaterally. Well-healed surgical incisions.  Pelvic: Normal female genitalia for age. Bimanual examination was no masses or nodularity. Rectal confirms.  Assessment/Plan: 73 year old status post neoadjuvant chemotherapy and optimal cytoreduction after 7 cycles of taxanes and platinum based chemotherapy. 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 She also high-grade metastatic cancer within the omentum. Her tumor was sent for [facility]. Based on the [facility] assay she was treated with 4 cycles of etoposide. She was then placed on maintenance Avastin for November to May 2015. Her last cycle of a taxane and carboplatin was in July 2014 and her CA 125 began rising in March of 2015. She had persistent disease she is most likely has a  degree of platinum and taxanes resistance. I had a lengthy greater than 40 minute discussion with the patient and her daughter.  We discussed several options. As she is asymptomatic and has imaging that is reassuring, we discussed following her CA 125 and not necessarily  treating at this point. We discussed the [place] data that suggests that treating a CA 125 and asymptomatic patient is not associated with any improvement in overall survival but is associated with an increased number of chemotherapeutic regimens. We discussed repeating a PET scan approximately 6-8 weeks after the one in June evaluated there's any residual disease at that time. If there is disease visible we could then consider a minimally invasive surgery to see if tumor could be sent for genetic molecular profiling to help guide therapy. If on repeat imaging there continues to be no disease, and she remains asymptomatic, she would be interested continued expectant management.  The other option we discussed was getting BRCA testing performed. The patient has been offered this in the past has declined as she was worried that her insurance would not cover the costs. Her daughter will call her insurance company to get preapproval. If she is BRCA mutated, we could consider  treating with the PARP inhibitor based on the new FDA guidelines.  We also discussed proceeding with tamoxifen for  treatment at this time. 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 It sounds like she had thrombosed varicose veins and had vein stripping. She was never on anticoagulation per her report. While potentially somewhat contraindicated this would be a potential therapeutic options.  We also discussed continuing with other agents for ovarian cancer. Based on the previous testing she had a most likely proceed with single agent topotecan or liposomal doxorubicin.  I spoke to Dr. Barnet Glasgow regarding the options. He would be comfortable with expectant management and not treating her asymptomatic rise in CA 125. He understands that I will take care of scheduling her PET scan and will contact her with the results and notify him as well. After her daughter checks with her insurance company if we can get the BRCA testing,  we will take care of arranging that for the patient.  Their questions were elicited in answer to their satisfaction. They were given a copy of the PET scan for their records. They know that I will call them with the results of her next study will also communicate in the interim regarding the BRCA testing.  Ajanay Farve A., MD 09/05/2013, 9:32 AM

## 2013-09-10 ENCOUNTER — Encounter (HOSPITAL_COMMUNITY): Payer: Medicare HMO | Attending: Oncology

## 2013-09-10 ENCOUNTER — Encounter (HOSPITAL_COMMUNITY): Payer: Self-pay

## 2013-09-10 VITALS — BP 128/77 | HR 96 | Temp 98.5°F | Resp 18 | Wt 194.2 lb

## 2013-09-10 DIAGNOSIS — C569 Malignant neoplasm of unspecified ovary: Secondary | ICD-10-CM | POA: Diagnosis present

## 2013-09-10 DIAGNOSIS — G620 Drug-induced polyneuropathy: Secondary | ICD-10-CM

## 2013-09-10 DIAGNOSIS — Z853 Personal history of malignant neoplasm of breast: Secondary | ICD-10-CM

## 2013-09-10 DIAGNOSIS — C482 Malignant neoplasm of peritoneum, unspecified: Secondary | ICD-10-CM

## 2013-09-10 DIAGNOSIS — C50919 Malignant neoplasm of unspecified site of unspecified female breast: Secondary | ICD-10-CM

## 2013-09-10 DIAGNOSIS — C786 Secondary malignant neoplasm of retroperitoneum and peritoneum: Secondary | ICD-10-CM | POA: Diagnosis present

## 2013-09-10 DIAGNOSIS — Z452 Encounter for adjustment and management of vascular access device: Secondary | ICD-10-CM

## 2013-09-10 DIAGNOSIS — IMO0002 Reserved for concepts with insufficient information to code with codable children: Secondary | ICD-10-CM

## 2013-09-10 LAB — CBC WITH DIFFERENTIAL/PLATELET
Basophils Absolute: 0 10*3/uL (ref 0.0–0.1)
Basophils Relative: 1 % (ref 0–1)
Eosinophils Absolute: 0.2 10*3/uL (ref 0.0–0.7)
Eosinophils Relative: 3 % (ref 0–5)
HCT: 45.3 % (ref 36.0–46.0)
Hemoglobin: 14.6 g/dL (ref 12.0–15.0)
LYMPHS ABS: 2.3 10*3/uL (ref 0.7–4.0)
Lymphocytes Relative: 39 % (ref 12–46)
MCH: 28.4 pg (ref 26.0–34.0)
MCHC: 32.2 g/dL (ref 30.0–36.0)
MCV: 88.1 fL (ref 78.0–100.0)
MONOS PCT: 13 % — AB (ref 3–12)
Monocytes Absolute: 0.8 10*3/uL (ref 0.1–1.0)
NEUTROS ABS: 2.6 10*3/uL (ref 1.7–7.7)
NEUTROS PCT: 44 % (ref 43–77)
Platelets: 281 10*3/uL (ref 150–400)
RBC: 5.14 MIL/uL — AB (ref 3.87–5.11)
RDW: 15.9 % — ABNORMAL HIGH (ref 11.5–15.5)
WBC: 6 10*3/uL (ref 4.0–10.5)

## 2013-09-10 LAB — COMPREHENSIVE METABOLIC PANEL
ALK PHOS: 105 U/L (ref 39–117)
ALT: 25 U/L (ref 0–35)
ANION GAP: 13 (ref 5–15)
AST: 25 U/L (ref 0–37)
Albumin: 3.8 g/dL (ref 3.5–5.2)
BILIRUBIN TOTAL: 0.4 mg/dL (ref 0.3–1.2)
BUN: 16 mg/dL (ref 6–23)
CHLORIDE: 102 meq/L (ref 96–112)
CO2: 24 mEq/L (ref 19–32)
Calcium: 9.4 mg/dL (ref 8.4–10.5)
Creatinine, Ser: 0.84 mg/dL (ref 0.50–1.10)
GFR calc non Af Amer: 67 mL/min — ABNORMAL LOW (ref >90)
GFR, EST AFRICAN AMERICAN: 78 mL/min — AB (ref >90)
GLUCOSE: 83 mg/dL (ref 70–99)
POTASSIUM: 5.1 meq/L (ref 3.7–5.3)
SODIUM: 139 meq/L (ref 137–147)
Total Protein: 7 g/dL (ref 6.0–8.3)

## 2013-09-10 MED ORDER — SODIUM CHLORIDE 0.9 % IJ SOLN
10.0000 mL | INTRAMUSCULAR | Status: DC | PRN
  Administered 2013-09-10: 10 mL via INTRAVENOUS

## 2013-09-10 MED ORDER — BENZONATATE 100 MG PO CAPS: 100.0000 mg | ORAL_CAPSULE | ORAL | Status: DC | PRN

## 2013-09-10 MED ORDER — HEPARIN SOD (PORK) LOCK FLUSH 100 UNIT/ML IV SOLN
500.0000 [IU] | Freq: Once | INTRAVENOUS | Status: AC
  Administered 2013-09-10: 500 [IU] via INTRAVENOUS
  Filled 2013-09-10: qty 5

## 2013-09-10 MED ORDER — FLUTICASONE PROPIONATE 50 MCG/ACT NA SUSP: NASAL | Status: DC

## 2013-09-10 NOTE — Patient Instructions (Signed)
[place] Discharge Instructions  RECOMMENDATIONS MADE BY THE CONSULTANT AND ANY TEST RESULTS WILL BE SENT TO YOUR REFERRING PHYSICIAN.  EXAM FINDINGS BY THE PHYSICIAN TODAY AND SIGNS OR SYMPTOMS TO REPORT TO CLINIC OR PRIMARY PHYSICIAN: Exam and findings as discussed by Dr. Barnet Glasgow.  Will continue to watch you for now.  Will check some blood work today when we flush your port.  MEDICATIONS PRESCRIBED:  Flonase - take as directed  INSTRUCTIONS/FOLLOW-UP: Follow-up in 6 weeks for port flush, labs and office visit.  Thank you for choosing [facility] to provide your oncology and hematology care.  To afford each patient quality time with our providers, please arrive at least 15 minutes before your scheduled appointment time.  With your help, our goal is to use those 15 minutes to complete the necessary work-up to ensure our physicians have the information they need to help with your evaluation and healthcare recommendations.    Effective January 1st, 2014, we ask that you re-schedule your appointment with our physicians should you arrive 10 or more minutes late for your appointment.  We strive to give you quality time with our providers, and arriving late affects you and other patients whose appointments are after yours.    Again, thank you for choosing [facility].  Our hope is that these requests will decrease the amount of time that you wait before being seen by our physicians.       _____________________________________________________________  Should you have questions after your visit to [facility], please contact our office at [phone] between the hours of 8:30 a.m. and 4:30 p.m.  Voicemails left after 4:30 p.m. will not be returned until the following business day.  For prescription refill requests, have your pharmacy contact our office with your prescription refill request.     _______________________________________________________________  We hope that we have given you very good care.  You may receive a patient satisfaction survey in the mail, please complete it and return it as soon as possible.  We value your feedback!  _______________________________________________________________  Have you asked about our STAR program?  STAR stands for Survivorship Training and Rehabilitation, and this is a nationally recognized cancer care program that focuses on survivorship and rehabilitation.  Cancer and cancer treatments may cause problems, such as, pain, making you feel tired and keeping you from doing the things that you need or want to do. Cancer rehabilitation can help. Our goal is to reduce these troubling effects and help you have the best quality of life possible.  You may receive a survey from a nurse that asks questions about your current state of health.  Based on the survey results, all eligible patients will be referred to the [facility] program for an evaluation so we can better serve you!  A frequently asked questions sheet is available upon request.

## 2013-09-10 NOTE — Progress Notes (Signed)
Willy Eddy presented for labwork. Labs per MD order drawn via Portacath located in the left chest wall accessed with  H 20 needle. Good blood return present. Procedure without incident.  Needle removed intact. Patient tolerated procedure well.

## 2013-09-10 NOTE — Progress Notes (Signed)
[facility]  OFFICE PROGRESS NOTE  [facility], [name], MD  [place] [address]  DIAGNOSIS: Ovarian cancer, unspecified laterality - Plan: Schedule Portacath Flush Appointment, sodium chloride 0.9 % injection 10 mL, heparin lock flush 100 unit/mL  Peripheral neuropathy, secondary to drugs or chemicals  Breast cancer, unspecified laterality  Ovarian cancer - Plan: CBC with Differential, Comprehensive metabolic panel, CA 446  Omental metastasis - Plan: CBC with Differential, Comprehensive metabolic panel, CA 286  Chief Complaint  Patient presents with  . Peritoneal carcinomatosis    CURRENT THERAPY: Maintenance Avastin started 03/19/2013 with last treatment on 07/15/2013 discontinued because of worsening CA 125.  INTERVAL HISTORY: Rita Humphrey 73 y.o. female returns for followup after GYN oncology evaluation in the setting of progressive elevation in CA 125 with negative PET CT scan while receiving Avastin maintenance therapy, since discontinued due to elevation in CA 125. Unfortunately while painting report, the patient fell backwards oral phase small bladder and hit the back of her head. She did not lose consciousness. She still has a small bump but denies any blurred or double vision or changes in mental status. Bowel movements are regular with no dysuria, hematuria, abdominal swelling, lower extremity swelling or redness, back pain, headache, or seizures. She has had problems with cough particularly when she lies back along with nasal stuffiness.  MEDICAL HISTORY: Past Medical History  Diagnosis Date  . Hypertension   . Hyperlipemia   . Thyroid disease   . Asthma   . Adnexal mass 05/16/2012  . 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 Cancer     [place]  . Breast cancer     s/p bilateral mastectomy [id number]  . Ovarian cancer 05/17/2012  . 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 Peripheral vascular disease 2013    hx of DVT right leg  . History of kidney stones   . Anemia   . Platelets decreased april 2014     1 transfusion given before chemo  . Deafness in left ear   . Numbness in feet   . Numbness of fingers of both hands   . Dizziness     occasional  . History of breast cancer 12/07/2012    INTERIM HISTORY: has HTN (hypertension); Hyperlipemia; Low back pain; Neck pain; Adnexal mass; Ovarian cancer; Omental metastasis; Peripheral neuropathy, secondary to chemotherapy; History of breast cancer; and Osteoarthritis of right knee on her problem list.    ALLERGIES:  is allergic to gabapentin; penicillins; carboplatin; and morphine and related.  MEDICATIONS: has a current medication list which includes the following prescription(s): amlodipine, vitamin d3, docusate calcium, duloxetine, lidocaine-prilocaine, ondansetron, oxycodone-acetaminophen, potassium chloride sa, prochlorperazine, temazepam, tetrahydroz-dextran-peg-povid, benzonatate, and fluticasone, and the following Facility-Administered Medications: sodium chloride and sodium chloride.  SURGICAL HISTORY:  Past Surgical History  Procedure Laterality Date  . Thyroid lobectomy    . Foot surgery      2 spurs taken out of foot  . Varicose vein surgery    . Bladder tack surgery     . Tonsillectomy    . Mastectomy      bilateral   . Laparotomy Bilateral 06/05/2012    Procedure: EXPLORATORY LAPAROTOMY WITH PELVIC TUMOR BIOPSY;  Surgeon: Imagene Gurney A. Alycia Rossetti, MD;  Location: [facility];  Service: Gynecology;  Laterality: Bilateral;  . Abdominal hysterectomy  1980  . Vascular surgery  veins stripped from both legs  . Portacath placement Left 06/25/2012    Procedure: INSERTION PORT-A-CATH;  Surgeon: Jamesetta So, MD;  Location: [facility];  Service: General;  Laterality: Left;  . Laparoscopy N/A 10/30/2012    Procedure: LAPAROSCOPY DIAGNOSTIC, EXPLORATORY LAPAROTOMY, BILATERAL SALPINGO-OOPHORECTOMY, TUMOR DEBULKING;   Surgeon: Imagene Gurney A. Alycia Rossetti, MD;  Location: [facility];  Service: Gynecology;  Laterality: N/A;    FAMILY HISTORY: family history includes Cancer in her mother; Heart attack in her brother; Hypertension in her father and sister.  SOCIAL HISTORY:  reports that she has never smoked. She has never used smokeless tobacco. She reports that she does not drink alcohol or use illicit drugs.  REVIEW OF SYSTEMS:  Other than that discussed above is noncontributory.  PHYSICAL EXAMINATION: ECOG PERFORMANCE STATUS: 1 - Symptomatic but completely ambulatory  Blood pressure 128/77, pulse 96, temperature 98.5 F (36.9 C), temperature source Oral, resp. rate 18, weight 194 lb 3.2 oz (88.089 kg), SpO2 98.00%.  GENERAL:alert, no distress and comfortable. 3 cm swelling on the vertex of the scalp from recent fall with no evidence of skull fracture grossly. SKIN: skin color, texture, turgor are normal, no rashes or significant lesions EYES: PERLA; Conjunctiva are pink and non-injected, sclera clear SINUSES: No redness or tenderness over maxillary or ethmoid sinuses OROPHARYNX:no exudate, no erythema on lips, buccal mucosa, or tongue. NECK: supple, thyroid normal size, non-tender, without nodularity. No masses CHEST:  Normal AP diameter with light port in place. No breast masses. LYMPH:  no palpable lymphadenopathy in the cervical, axillary or inguinal LUNGS: clear to auscultation and percussion with normal breathing effort HEART: regular rate & rhythm and no murmurs. ABDOMEN:abdomen soft, non-tender and normal bowel sounds. No masses or hepatosplenomegaly. No free fluid wave or shifting dullness. MUSCULOSKELETAL:no cyanosis of digits and no clubbing. Range of motion normal.  NEURO: alert & oriented x 3 with fluent speech, no focal motor/sensory deficits   LABORATORY DATA:Results for DESHANTI, ADCOX (MRN [id number]) as of 09/11/2013 06:40  Ref. Range 06/24/2013 10:33 07/15/2013 10:10 08/05/2013 11:15 08/23/2013 10:32 09/10/2013  15:10  CA 125 Latest Range: 0.0-30.2 U/mL 53.3 (H) 103.6 (H) 260.1 (H) 524.6 (H) 1093.6 (H)       Office Visit on 09/10/2013  Component Date Value Ref Range Status  . WBC 09/10/2013 6.0  4.0 - 10.5 K/uL Final  . RBC 09/10/2013 5.14* 3.87 - 5.11 MIL/uL Final  . Hemoglobin 09/10/2013 14.6  12.0 - 15.0 g/dL Final  . HCT 09/10/2013 45.3  36.0 - 46.0 % Final  . MCV 09/10/2013 88.1  78.0 - 100.0 fL Final  . MCH 09/10/2013 28.4  26.0 - 34.0 pg Final  . MCHC 09/10/2013 32.2  30.0 - 36.0 g/dL Final  . RDW 09/10/2013 15.9* 11.5 - 15.5 % Final  . Platelets 09/10/2013 281  150 - 400 K/uL Final  . Neutrophils Relative % 09/10/2013 44  43 - 77 % Final  . Neutro Abs 09/10/2013 2.6  1.7 - 7.7 K/uL Final  . Lymphocytes Relative 09/10/2013 39  12 - 46 % Final  . Lymphs Abs 09/10/2013 2.3  0.7 - 4.0 K/uL Final  . Monocytes Relative 09/10/2013 13* 3 - 12 % Final  . Monocytes Absolute 09/10/2013 0.8  0.1 - 1.0 K/uL Final  . Eosinophils Relative 09/10/2013 3  0 - 5 % Final  . Eosinophils Absolute 09/10/2013 0.2  0.0 - 0.7 K/uL Final  . Basophils Relative 09/10/2013 1  0 - 1 % Final  . Basophils Absolute  09/10/2013 0.0  0.0 - 0.1 K/uL Final  . Sodium 09/10/2013 139  137 - 147 mEq/L Final  . Potassium 09/10/2013 5.1  3.7 - 5.3 mEq/L Final  . Chloride 09/10/2013 102  96 - 112 mEq/L Final  . CO2 09/10/2013 24  19 - 32 mEq/L Final  . Glucose, Bld 09/10/2013 83  70 - 99 mg/dL Final  . BUN 09/10/2013 16  6 - 23 mg/dL Final  . Creatinine, Ser 09/10/2013 0.84  0.50 - 1.10 mg/dL Final  . Calcium 09/10/2013 9.4  8.4 - 10.5 mg/dL Final  . Total Protein 09/10/2013 7.0  6.0 - 8.3 g/dL Final  . Albumin 09/10/2013 3.8  3.5 - 5.2 g/dL Final  . AST 09/10/2013 25  0 - 37 U/L Final  . ALT 09/10/2013 25  0 - 35 U/L Final  . Alkaline Phosphatase 09/10/2013 105  39 - 117 U/L Final  . Total Bilirubin 09/10/2013 0.4  0.3 - 1.2 mg/dL Final  . GFR calc non Af Amer 09/10/2013 67* >90 mL/min Final  . GFR calc Af Amer  09/10/2013 78* >90 mL/min Final   Comment: (NOTE)                          The eGFR has been calculated using the CKD EPI equation.                          This calculation has not been validated in all clinical situations.                          eGFR's persistently <90 mL/min signify possible Chronic Kidney                          Disease.  . Anion gap 09/10/2013 13  5 - 15 Final  . CA 125 09/10/2013 1093.6* 0.0 - 30.2 U/mL Final   Comment: (NOTE)                          Result repeated and verified.                          Result confirmed by automatic dilution.                          Performed at [facility] on 08/23/2013  Component Date Value Ref Range Status  . WBC 08/23/2013 8.1  4.0 - 10.5 K/uL Final  . RBC 08/23/2013 5.15* 3.87 - 5.11 MIL/uL Final  . Hemoglobin 08/23/2013 14.8  12.0 - 15.0 g/dL Final  . HCT 08/23/2013 45.0  36.0 - 46.0 % Final  . MCV 08/23/2013 87.4  78.0 - 100.0 fL Final  . MCH 08/23/2013 28.7  26.0 - 34.0 pg Final  . MCHC 08/23/2013 32.9  30.0 - 36.0 g/dL Final  . RDW 08/23/2013 16.1* 11.5 - 15.5 % Final  . Platelets 08/23/2013 290  150 - 400 K/uL Final  . Neutrophils Relative % 08/23/2013 53  43 - 77 % Final  . Neutro Abs 08/23/2013 4.2  1.7 - 7.7 K/uL Final  . Lymphocytes Relative 08/23/2013 35  12 - 46 % Final  . Lymphs Abs 08/23/2013 2.9  0.7 - 4.0 K/uL Final  . Monocytes Relative  08/23/2013 10  3 - 12 % Final  . Monocytes Absolute 08/23/2013 0.8  0.1 - 1.0 K/uL Final  . Eosinophils Relative 08/23/2013 2  0 - 5 % Final  . Eosinophils Absolute 08/23/2013 0.2  0.0 - 0.7 K/uL Final  . Basophils Relative 08/23/2013 0  0 - 1 % Final  . Basophils Absolute 08/23/2013 0.0  0.0 - 0.1 K/uL Final  . Sodium 08/23/2013 139  137 - 147 mEq/L Final  . Potassium 08/23/2013 5.0  3.7 - 5.3 mEq/L Final  . Chloride 08/23/2013 101  96 - 112 mEq/L Final  . CO2 08/23/2013 25  19 - 32 mEq/L Final  . Glucose, Bld 08/23/2013 80  70 - 99 mg/dL Final  .  BUN 08/23/2013 17  6 - 23 mg/dL Final  . Creatinine, Ser 08/23/2013 0.89  0.50 - 1.10 mg/dL Final  . Calcium 08/23/2013 9.7  8.4 - 10.5 mg/dL Final  . Total Protein 08/23/2013 7.3  6.0 - 8.3 g/dL Final  . Albumin 08/23/2013 3.7  3.5 - 5.2 g/dL Final  . AST 08/23/2013 21  0 - 37 U/L Final  . ALT 08/23/2013 23  0 - 35 U/L Final  . Alkaline Phosphatase 08/23/2013 103  39 - 117 U/L Final  . Total Bilirubin 08/23/2013 0.4  0.3 - 1.2 mg/dL Final  . GFR calc non Af Amer 08/23/2013 63* >90 mL/min Final  . GFR calc Af Amer 08/23/2013 73* >90 mL/min Final   Comment: (NOTE)                          The eGFR has been calculated using the CKD EPI equation.                          This calculation has not been validated in all clinical situations.                          eGFR's persistently <90 mL/min signify possible Chronic Kidney                          Disease.  . CA 125 08/23/2013 524.6* 0.0 - 30.2 U/mL Final   Performed at [facility] on 08/14/2013  Component Date Value Ref Range Status  . Glucose-Capillary 08/14/2013 101* 70 - 99 mg/dL Final    PATHOLOGY: No new pathology.  Urinalysis    Component Value Date/Time   COLORURINE YELLOW 07/03/2012 1158   APPEARANCEUR CLEAR 07/03/2012 1158   LABSPEC >1.030* 08/05/2013 0946   PHURINE 5.5 08/05/2013 0946   GLUCOSEU NEGATIVE 08/05/2013 0946   HGBUR NEGATIVE 08/05/2013 0946   BILIRUBINUR NEGATIVE 08/05/2013 0946   KETONESUR NEGATIVE 08/05/2013 0946   PROTEINUR TRACE* 08/05/2013 0946   UROBILINOGEN 0.2 08/05/2013 0946   NITRITE NEGATIVE 08/05/2013 0946   LEUKOCYTESUR SMALL* 08/05/2013 0946    RADIOGRAPHIC STUDIES: Nm Pet Image Restag (ps) Skull Base To Thigh  08/14/2013   CLINICAL DATA:  Subsequent treatment strategy for ovarian cancer. Rita Humphrey 73 y.o. female returns for followup while receiving maintenance Avastin for peritoneal carcinomatosis with treatment started on 03/19/2013 after having received  carboplatin/docetaxel, carboplatin/paclitaxel, and carboplatin/Abraxane followed by 4 cycles of etoposide albeit after debulking surgery on 06/05/2012. She was started on Avastin maintenance on 03/19/2013.  EXAM: NUCLEAR MEDICINE PET SKULL BASE TO THIGH  TECHNIQUE: 10.4  mCi F-18 FDG was injected intravenously. Full-ring PET imaging was performed from the skull base to thigh after the radiotracer. CT data was obtained and used for attenuation correction and anatomic localization.  FASTING BLOOD GLUCOSE:  Value: 101 mg/dl  COMPARISON:  PET-CT 03/05/2013.  Diagnostic CTs 07/04/2013.  FINDINGS: NECK  No hypermetabolic cervical lymph nodes are identified.There are no lesions of the pharyngeal mucosal space.  CHEST  There are no hypermetabolic mediastinal, hilar or axillary lymph nodes. There is no hypermetabolic pulmonary activity. There are stable surgical clips in the right axilla, a stable left subclavian Port-A-Cath and azygos fissure. Mild emphysematous changes within the lungs are stable.  ABDOMEN/PELVIS  There is no hypermetabolic activity within the liver, adrenal glands, spleen or pancreas. There is no hypermetabolic nodal activity. There is no abnormal activity within the right pelvis to correspond with the soft tissue nodule questioned on the recent pelvic CT (image 169). There is no suspicious omental nodularity or ascites. Hepatic steatosis, bilateral renal parapelvic cysts, left renal angiomyolipoma, sigmoid diverticulosis and postsurgical changes within the anterior abdominal wall are stable.  SKELETON  There is no hypermetabolic activity to suggest osseous metastatic disease.  IMPRESSION: Stable examination without evidence of recurrent ovarian cancer. Incidental findings described above are stable.   Electronically Signed   By: Camie Patience M.D.   On: 08/14/2013 15:03    ASSESSMENT:  #1. Primary peritoneal carcinomatosis presenting with inguinal lymphadenopathy, status post biopsy followed by 6 cycles  of carboplatin and Taxol with laparoscopic complete response, tooth extraction, and institution of Avastin maintenance therapy 3 weeks ago, tolerated well, no evidence of disease on CT scan done 06/27/2013, negative PET/CT scan on 08/14/2013 despite elevation in CA 125 to 524 on 08/23/2013, please value 1093.6. #2. Minimal to no residual peripheral neuropathy from previous treatment.  #3. Status post tooth extraction with no complications.  #4. Cold intolerance, normal thyroid function. #5. History of breast cancer. #6. Symptomatic allergic rhinitis.    PLAN:  #1. Testing for BRCA1 and BRCA2 will be attempted. If positive, patient would be a candidate for Olaparib therapy in the future. #2. Repeat PET/CT scan in approximately 1-2 months per Dr. Alycia Rossetti. No plans for surgery now but if PET scan reveals evidence of disease, excision will be attempted for molecular testing. #3. Flonase 2 puffs in his nostril at bedtime. #4. Followup in 6 weeks with CBC, chem profile, CEA 125.   All questions were answered. The patient knows to call the clinic with any problems, questions or concerns. We can certainly see the patient much sooner if necessary.   I spent 25 minutes counseling the patient face to face. The total time spent in the appointment was 30 minutes.    Doroteo Bradford, MD 09/11/2013 6:39 AM  DISCLAIMER:  This note was dictated with voice recognition software.  Similar sounding words can inadvertently be transcribed inaccurately and may not be corrected upon review.

## 2013-09-11 LAB — CA 125: CA 125: 1093.6 U/mL — ABNORMAL HIGH (ref 0.0–30.2)

## 2013-09-13 ENCOUNTER — Encounter (HOSPITAL_COMMUNITY): Payer: Medicare HMO

## 2013-09-30 ENCOUNTER — Ambulatory Visit (HOSPITAL_COMMUNITY): Admission: RE | Admit: 2013-09-30 | Payer: Medicare HMO | Source: Ambulatory Visit

## 2013-10-03 ENCOUNTER — Encounter (HOSPITAL_COMMUNITY)
Admission: RE | Admit: 2013-10-03 | Discharge: 2013-10-03 | Disposition: A | Discharge status: Home / Self Care | Payer: Medicare HMO | Source: Ambulatory Visit | Attending: Gynecologic Oncology | Admitting: Gynecologic Oncology

## 2013-10-03 DIAGNOSIS — C569 Malignant neoplasm of unspecified ovary: Secondary | ICD-10-CM | POA: Insufficient documentation

## 2013-10-03 LAB — GLUCOSE, CAPILLARY: Glucose-Capillary: 99 mg/dL (ref 70–99)

## 2013-10-03 MED ORDER — FLUDEOXYGLUCOSE F - 18 (FDG) INJECTION
9.8000 | Freq: Once | INTRAVENOUS | Status: AC | PRN
  Administered 2013-10-03: 9.8 via INTRAVENOUS

## 2013-10-08 ENCOUNTER — Telehealth: Payer: Self-pay | Admitting: Gynecologic Oncology

## 2013-10-08 NOTE — Telephone Encounter (Signed)
Informed patient's daughter of PET scan results which showed more conclusive evidence for peritoneal recurrence of her high grade ovarian cancer. I discussed that there was no specific, well defined mass, but rather non-specific stranding and hyperattenuation of the peritoneal surfaces, particularly in the pelvis, with banding and ascites (loculated). The left and right para-colic gutters appear to have the most notable disease and nodularity.   I discussed that based on Dr Elenora Gamma last discussion and documentation, this result serves as more conclusive evidence for recurrence, however, intervention or not can be guided by patient's desires/symptoms. If biopsy is desired, it would appear that sampling of the para-colic gutter peritoneum might yield tumor-positive tissue for oncotyping. Alternatively, expectant management and intervention based on the emergence of symptoms or immediate institution of empiric chemotherapy are also reasonable options.  I informed Carleen that I would pass on to Dr Alycia Rossetti that we had spoken, as well as to Dr Barnet Glasgow.  Donaciano Eva, MD

## 2013-10-11 ENCOUNTER — Other Ambulatory Visit (HOSPITAL_COMMUNITY): Payer: Self-pay | Admitting: *Deleted

## 2013-10-11 ENCOUNTER — Other Ambulatory Visit (HOSPITAL_COMMUNITY): Payer: Self-pay | Admitting: Hematology and Oncology

## 2013-10-11 DIAGNOSIS — C786 Secondary malignant neoplasm of retroperitoneum and peritoneum: Secondary | ICD-10-CM

## 2013-10-11 DIAGNOSIS — C569 Malignant neoplasm of unspecified ovary: Secondary | ICD-10-CM

## 2013-10-11 DIAGNOSIS — Z1502 Genetic susceptibility to malignant neoplasm of ovary: Secondary | ICD-10-CM

## 2013-10-15 ENCOUNTER — Telehealth: Payer: Self-pay | Admitting: *Deleted

## 2013-10-15 NOTE — Telephone Encounter (Signed)
Called and confirmed 10/23/13 genetic appt w/ pt.  Emailed Hildred Alamin at referring to make her aware.

## 2013-10-22 ENCOUNTER — Other Ambulatory Visit (HOSPITAL_COMMUNITY): Payer: Medicare HMO

## 2013-10-22 ENCOUNTER — Encounter (HOSPITAL_COMMUNITY): Payer: Self-pay

## 2013-10-22 ENCOUNTER — Encounter (HOSPITAL_COMMUNITY): Payer: Medicare HMO | Attending: Oncology

## 2013-10-22 ENCOUNTER — Encounter (HOSPITAL_BASED_OUTPATIENT_CLINIC_OR_DEPARTMENT_OTHER): Payer: Medicare HMO

## 2013-10-22 VITALS — BP 131/87 | HR 92 | Temp 98.6°F | Resp 16 | Wt 196.6 lb

## 2013-10-22 DIAGNOSIS — G622 Polyneuropathy due to other toxic agents: Secondary | ICD-10-CM

## 2013-10-22 DIAGNOSIS — C50919 Malignant neoplasm of unspecified site of unspecified female breast: Secondary | ICD-10-CM | POA: Diagnosis present

## 2013-10-22 DIAGNOSIS — C786 Secondary malignant neoplasm of retroperitoneum and peritoneum: Secondary | ICD-10-CM

## 2013-10-22 DIAGNOSIS — IMO0002 Reserved for concepts with insufficient information to code with codable children: Secondary | ICD-10-CM

## 2013-10-22 DIAGNOSIS — C569 Malignant neoplasm of unspecified ovary: Secondary | ICD-10-CM | POA: Diagnosis present

## 2013-10-22 LAB — COMPREHENSIVE METABOLIC PANEL
ALBUMIN: 3.9 g/dL (ref 3.5–5.2)
ALT: 21 U/L (ref 0–35)
ANION GAP: 14 (ref 5–15)
AST: 23 U/L (ref 0–37)
Alkaline Phosphatase: 113 U/L (ref 39–117)
BUN: 18 mg/dL (ref 6–23)
CO2: 24 mEq/L (ref 19–32)
CREATININE: 0.8 mg/dL (ref 0.50–1.10)
Calcium: 9.7 mg/dL (ref 8.4–10.5)
Chloride: 103 mEq/L (ref 96–112)
GFR calc Af Amer: 83 mL/min — ABNORMAL LOW (ref >90)
GFR calc non Af Amer: 71 mL/min — ABNORMAL LOW (ref >90)
Glucose, Bld: 90 mg/dL (ref 70–99)
Potassium: 4.6 mEq/L (ref 3.7–5.3)
Sodium: 141 mEq/L (ref 137–147)
TOTAL PROTEIN: 7.5 g/dL (ref 6.0–8.3)
Total Bilirubin: 0.3 mg/dL (ref 0.3–1.2)

## 2013-10-22 LAB — CBC WITH DIFFERENTIAL/PLATELET
BASOS ABS: 0 10*3/uL (ref 0.0–0.1)
Basophils Relative: 0 % (ref 0–1)
EOS ABS: 0.2 10*3/uL (ref 0.0–0.7)
EOS PCT: 2 % (ref 0–5)
HCT: 41.7 % (ref 36.0–46.0)
Hemoglobin: 13.6 g/dL (ref 12.0–15.0)
LYMPHS PCT: 34 % (ref 12–46)
Lymphs Abs: 3.1 10*3/uL (ref 0.7–4.0)
MCH: 28.4 pg (ref 26.0–34.0)
MCHC: 32.6 g/dL (ref 30.0–36.0)
MCV: 87.1 fL (ref 78.0–100.0)
Monocytes Absolute: 0.8 10*3/uL (ref 0.1–1.0)
Monocytes Relative: 9 % (ref 3–12)
Neutro Abs: 4.9 10*3/uL (ref 1.7–7.7)
Neutrophils Relative %: 55 % (ref 43–77)
Platelets: 311 10*3/uL (ref 150–400)
RBC: 4.79 MIL/uL (ref 3.87–5.11)
RDW: 14.9 % (ref 11.5–15.5)
WBC: 8.9 10*3/uL (ref 4.0–10.5)

## 2013-10-22 MED ORDER — HEPARIN SOD (PORK) LOCK FLUSH 100 UNIT/ML IV SOLN
500.0000 [IU] | Freq: Once | INTRAVENOUS | Status: AC
  Administered 2013-10-22: 500 [IU] via INTRAVENOUS
  Filled 2013-10-22: qty 5

## 2013-10-22 MED ORDER — SODIUM CHLORIDE 0.9 % IJ SOLN
10.0000 mL | INTRAMUSCULAR | Status: AC | PRN
  Administered 2013-10-22: 10 mL via INTRAVENOUS

## 2013-10-22 NOTE — Progress Notes (Unsigned)
Rita Humphrey presented for labwork. Labs per MD order drawn via Portacath located in the left chest wall accessed with  H 20 needle. Good blood return present. Procedure without incident.  Needle removed intact. Patient tolerated procedure well.

## 2013-10-22 NOTE — Progress Notes (Signed)
[facility]  OFFICE PROGRESS NOTE  Rita, [name], MD  [place] [address]  DIAGNOSIS: Ovarian cancer, unspecified laterality - Plan: magnesium hydroxide (MILK OF MAGNESIA) 400 MG/5ML suspension, CBC with Differential, CA 125, Comprehensive metabolic panel, Comprehensive metabolic panel  Peripheral neuropathy, secondary to drugs or chemicals - Plan: magnesium hydroxide (MILK OF MAGNESIA) 400 MG/5ML suspension, CBC with Differential, CA 125, Comprehensive metabolic panel, Comprehensive metabolic panel  Breast cancer, unspecified laterality - Plan: magnesium hydroxide (MILK OF MAGNESIA) 400 MG/5ML suspension, CBC with Differential, CA 125, Comprehensive metabolic panel, Comprehensive metabolic panel  Ovarian cancer - Plan: magnesium hydroxide (MILK OF MAGNESIA) 400 MG/5ML suspension, CBC with Differential, CA 125, Comprehensive metabolic panel, Comprehensive metabolic panel  Omental metastasis - Plan: magnesium hydroxide (MILK OF MAGNESIA) 400 MG/5ML suspension, CBC with Differential, CA 125, Comprehensive metabolic panel, Comprehensive metabolic panel  Chief Complaint  Patient presents with  . Ovarian Cancer    CURRENT THERAPY: Watchful expectation with rising CA 125. Most recent treatment with maintenance Avastin started on 03/19/2013 with last treatment on 07/15/2013 discontinued because of worsening CA 125.  INTERVAL HISTORY: Rita Humphrey 73 y.o. female returns for followup after GYN oncology evaluation in the setting of progressive elevation in CA 125 with negative PET CT scan while receiving Avastin maintenance therapy, since discontinued due to elevation in CA 125. Patient has an appointment with genetic counselor on 10/23/2013 for discussion of BRCA1 and BRCA2 determination in hopes of offering Olaparib as salvage therapy. Patient is allergic to carboplatin and developed severe neuropathy from Taxol. She was subsequently   consolidated with VP-16 chemotherapy prior to initiation of Avastin maintenance. VP-16 sensitivity was documented on ChemoFx molecular testing. Intermediate sensitivities were attributed to Topotecan and doxorubicin. Most recent PET scan on 10/03/2013 shows progression of disease particularly in the paracolic gutters. She has had increasing abdominal pain and is holding back on analgesics. Bowel movements require milk of magnesia. She denies any nausea or vomiting, fever, night sweats, cough, wheezing, shortness of breath, worsening lower extremity swelling or redness, chest pain, PND, orthopnea, palpitations, but with persistent peripheral paresthesias without skin rash, joint pain, headache, or seizures.    MEDICAL HISTORY: Past Medical History  Diagnosis Date  . Hypertension   . Hyperlipemia   . Thyroid disease   . Asthma   . Adnexal mass 05/16/2012  . 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 Cancer     [place]  . Breast cancer     s/p bilateral mastectomy [id number]  . Ovarian cancer 05/17/2012  . 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 Peripheral vascular disease 2013    hx of DVT right leg  . History of kidney stones   . Anemia   . Platelets decreased april 2014     1 transfusion given before chemo  . Deafness in left ear   . Numbness in feet   . Numbness of fingers of both hands   . Dizziness     occasional  . History of breast cancer 12/07/2012    INTERIM HISTORY: has HTN (hypertension); Hyperlipemia; Low back pain; Neck pain; Adnexal mass; Ovarian cancer; Omental metastasis; Peripheral neuropathy, secondary to chemotherapy; History of breast cancer; and Osteoarthritis of right knee on her problem list.    ALLERGIES:  is allergic to gabapentin; penicillins; carboplatin; and morphine and related.  MEDICATIONS: has a current medication list which  includes the following prescription(s): amlodipine, benzonatate, vitamin d3, docusate  calcium, duloxetine, lidocaine-prilocaine, magnesium hydroxide, oxycodone-acetaminophen, potassium chloride sa, fluticasone, ondansetron, prochlorperazine, temazepam, and tetrahydroz-dextran-peg-povid, and the following Facility-Administered Medications: sodium chloride and sodium chloride.  SURGICAL HISTORY:  Past Surgical History  Procedure Laterality Date  . Thyroid lobectomy    . Foot surgery      2 spurs taken out of foot  . Varicose vein surgery    . Bladder tack surgery     . Tonsillectomy    . Mastectomy      bilateral   . Laparotomy Bilateral 06/05/2012    Procedure: EXPLORATORY LAPAROTOMY WITH PELVIC TUMOR BIOPSY;  Surgeon: Imagene Gurney A. Alycia Rossetti, MD;  Location: [facility];  Service: Gynecology;  Laterality: Bilateral;  . Abdominal hysterectomy  1980  . Vascular surgery      veins stripped from both legs  . Portacath placement Left 06/25/2012    Procedure: INSERTION PORT-A-CATH;  Surgeon: Jamesetta So, MD;  Location: [facility];  Service: General;  Laterality: Left;  . Laparoscopy N/A 10/30/2012    Procedure: LAPAROSCOPY DIAGNOSTIC, EXPLORATORY LAPAROTOMY, BILATERAL SALPINGO-OOPHORECTOMY, TUMOR DEBULKING;  Surgeon: Imagene Gurney A. Alycia Rossetti, MD;  Location: [facility];  Service: Gynecology;  Laterality: N/A;    FAMILY HISTORY: family history includes Cancer in her mother; Heart attack in her brother; Hypertension in her father and sister.  SOCIAL HISTORY:  reports that she has never smoked. She has never used smokeless tobacco. She reports that she does not drink alcohol or use illicit drugs.  REVIEW OF SYSTEMS:  Other than that discussed above is noncontributory.  PHYSICAL EXAMINATION: ECOG PERFORMANCE STATUS: 1 - Symptomatic but completely ambulatory  Blood pressure 131/87, pulse 92, temperature 98.6 F (37 C), temperature source Oral, resp. rate 16, weight 196 lb 9.6 oz (89.177 kg).  GENERAL:alert, no distress and comfortable SKIN: skin color, texture, turgor are normal, no rashes or significant  lesions EYES: PERLA; Conjunctiva are pink and non-injected, sclera clear SINUSES: No redness or tenderness over maxillary or ethmoid sinuses OROPHARYNX:no exudate, no erythema on lips, buccal mucosa, or tongue. NECK: supple, thyroid normal size, non-tender, without nodularity. No masses CHEST: LifePort in place. No breast masses. LYMPH:  no palpable lymphadenopathy in the cervical, axillary or inguinal LUNGS: clear to auscultation and percussion with normal breathing effort HEART: regular rate & rhythm and no murmurs. ABDOMEN:abdomen soft, non-tender and normal bowel sounds. Minimal tenderness left lower quadrant with no rebound tenderness. No free fluid wave or shifting dullness. MUSCULOSKELETAL:no cyanosis of digits and no clubbing. Range of motion normal.  NEURO: alert & oriented x 3 with fluent speech, no focal motor/sensory deficits. Decreased the tendon reflexes bilaterally.   LABORATORY DATA: Office Visit on 10/22/2013  Component Date Value Ref Range Status  . WBC 10/22/2013 8.9  4.0 - 10.5 K/uL Final  . RBC 10/22/2013 4.79  3.87 - 5.11 MIL/uL Final  . Hemoglobin 10/22/2013 13.6  12.0 - 15.0 g/dL Final  . HCT 10/22/2013 41.7  36.0 - 46.0 % Final  . MCV 10/22/2013 87.1  78.0 - 100.0 fL Final  . MCH 10/22/2013 28.4  26.0 - 34.0 pg Final  . MCHC 10/22/2013 32.6  30.0 - 36.0 g/dL Final  . RDW 10/22/2013 14.9  11.5 - 15.5 % Final  . Platelets 10/22/2013 311  150 - 400 K/uL Final  . Neutrophils Relative % 10/22/2013 55  43 - 77 % Final  . Neutro Abs 10/22/2013 4.9  1.7 - 7.7 K/uL Final  . Lymphocytes Relative 10/22/2013 34  12 - 46 % Final  . Lymphs Abs 10/22/2013 3.1  0.7 - 4.0 K/uL Final  . Monocytes Relative 10/22/2013 9  3 - 12 % Final  . Monocytes Absolute 10/22/2013 0.8  0.1 - 1.0 K/uL Final  . Eosinophils Relative 10/22/2013 2  0 - 5 % Final  . Eosinophils Absolute 10/22/2013 0.2  0.0 - 0.7 K/uL Final  . Basophils Relative 10/22/2013 0  0 - 1 % Final  . Basophils Absolute  10/22/2013 0.0  0.0 - 0.1 K/uL Final  . Sodium 10/22/2013 141  137 - 147 mEq/L Final  . Potassium 10/22/2013 4.6  3.7 - 5.3 mEq/L Final  . Chloride 10/22/2013 103  96 - 112 mEq/L Final  . CO2 10/22/2013 24  19 - 32 mEq/L Final  . Glucose, Bld 10/22/2013 90  70 - 99 mg/dL Final  . BUN 10/22/2013 18  6 - 23 mg/dL Final  . Creatinine, Ser 10/22/2013 0.80  0.50 - 1.10 mg/dL Final  . Calcium 10/22/2013 9.7  8.4 - 10.5 mg/dL Final  . Total Protein 10/22/2013 7.5  6.0 - 8.3 g/dL Final  . Albumin 10/22/2013 3.9  3.5 - 5.2 g/dL Final  . AST 10/22/2013 23  0 - 37 U/L Final  . ALT 10/22/2013 21  0 - 35 U/L Final  . Alkaline Phosphatase 10/22/2013 113  39 - 117 U/L Final  . Total Bilirubin 10/22/2013 0.3  0.3 - 1.2 mg/dL Final  . GFR calc non Af Amer 10/22/2013 71* >90 mL/min Final  . GFR calc Af Amer 10/22/2013 83* >90 mL/min Final   Comment: (NOTE)                          The eGFR has been calculated using the CKD EPI equation.                          This calculation has not been validated in all clinical situations.                          eGFR's persistently <90 mL/min signify possible Chronic Kidney                          Disease.  Georgiann Hahn gap 10/22/2013 14  5 - 15 Final  Hospital Outpatient Visit on 10/03/2013  Component Date Value Ref Range Status  . Glucose-Capillary 10/03/2013 99  70 - 99 mg/dL Final    PATHOLOGY: Adenocarcinoma  Urinalysis    Component Value Date/Time   COLORURINE YELLOW 07/03/2012 1158   APPEARANCEUR CLEAR 07/03/2012 1158   LABSPEC >1.030* 08/05/2013 0946   PHURINE 5.5 08/05/2013 0946   GLUCOSEU NEGATIVE 08/05/2013 0946   HGBUR NEGATIVE 08/05/2013 0946   BILIRUBINUR NEGATIVE 08/05/2013 0946   KETONESUR NEGATIVE 08/05/2013 0946   PROTEINUR TRACE* 08/05/2013 0946   UROBILINOGEN 0.2 08/05/2013 0946   NITRITE NEGATIVE 08/05/2013 0946   LEUKOCYTESUR SMALL* 08/05/2013 0946    RADIOGRAPHIC STUDIES: Nm Pet Image Restag (ps) Skull Base To Thigh  10/03/2013   CLINICAL DATA:   Subsequent treatment strategy for ovarian cancer.  EXAM: NUCLEAR MEDICINE PET SKULL BASE TO THIGH  TECHNIQUE: 9.8 mCi F-18 FDG was injected intravenously. Full-ring PET imaging was performed from the skull base to thigh after the radiotracer. CT data was obtained and used for attenuation correction and anatomic localization.  FASTING BLOOD  GLUCOSE:  Value: 99 mg/dl  COMPARISON:  08/14/2013; 03/05/2013  FINDINGS: NECK  Asymmetric plate high activity along the right lingual tonsil, standard uptake value 5.9 is compared to the contralateral side measurement of 4.2. This is relatively subtle and probably incidental.  Left thyroidectomy.  CHEST  No hypermetabolic mediastinal or hilar nodes. No suspicious pulmonary nodules on the CT scan. Azygos fissure noted. Prior right axillary lymph node dissection.  ABDOMEN/PELVIS  Scattered physiologic bowel activity is present. Unfortunately, there is also abnormal new nodular and bandlike activity in the left paracolic gutter. There is high activity along the adjacent: But most of the abnormal activity in this vicinity is clearly within the paracolic band. Representative maximum standard uptake value in the left lower quadrant is 9.1 ; there is some similar abnormal high activity adjacent to the cecum with maximum standard uptake value 6.2, as well as abnormal activity in bandlike ascites just above the urinary bladder with maximum standard uptake value of approximately 18.5. Appearance compatible with peritoneal spread of ovarian metastatic disease.  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 Bilateral renal peripelvic cysts. Lower abdominal ventral hernia similar prior, likely containing postoperative fluid.  Sigmoid diverticulosis noted.  SKELETON  No focal hypermetabolic activity to suggest skeletal metastasis.  IMPRESSION: 1. New peritoneal spread of tumor, with bandlike thickening along the paracolic gutters (left greater than right) and in the lower pelvis with high metabolic  activity compatible with malignancy. 2. Asymmetric high activity in the right lingual tonsil compared to the left, probably incidental, but may merit ENT referral/direct visualization. 3. Ancillary findings include a left renal angiomyolipoma and sigmoid diverticulosis.   Electronically Signed   By: Sherryl Barters M.D.   On: 10/03/2013 15:20    ASSESSMENT:  #1. 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 #2.Residual peripheral neuropathy from previous treatment with Taxol. #3. Status post tooth extraction with no complications.  #4. Cold intolerance, normal thyroid function.  #5. History of breast cancer, possible BRCA2 positivity. #6. Symptomatic allergic rhinitis    PLAN:  #1. Genetics appointment tomorrow for BRCA2 testing. If positive, treatment with Olaparib. #2. If BRCA2 is negative, tissue diagnosis for repeat ChemoFX testing to determine which agent not to use in which agent has a high probability of effecting a response. #3. Will check with interventional radiology in the meantime to see if currently demonstrable disease is accessible percutaneously. #4. Tentative appointment made for 4 weeks with CBC, chem profile, CA 125 in addition to followup with GYN oncology.   All questions were answered. The patient knows to call the clinic with any problems, questions or concerns. We can certainly see the patient much sooner if necessary.   I spent 40 minutes counseling the patient face to face. The total time spent in the appointment was 55  minutes.    Doroteo Bradford, MD 10/23/2013 8:20 AM  DISCLAIMER:  This note was dictated with voice recognition software.  Similar sounding words can inadvertently be transcribed inaccurately and may not be corrected upon review.

## 2013-10-22 NOTE — Patient Instructions (Signed)
[facility] Discharge Instructions  RECOMMENDATIONS MADE BY THE CONSULTANT AND ANY TEST RESULTS WILL BE SENT TO YOUR REFERRING PHYSICIAN.  EXAM FINDINGS BY THE PHYSICIAN TODAY AND SIGNS OR SYMPTOMS TO REPORT TO CLINIC OR PRIMARY PHYSICIAN: Exam and findings as discussed by Dr. Barnet Glasgow.  Call and let us know when the results to the Genetic Testing will be back.  If the test is positive plans are to start you on Olaparib.  MD will also contact Interventional Radiology to see if they can see something on PET scan that can be easily biopsied. Will see you back after all results are available to complete plan for care.    INSTRUCTIONS/FOLLOW-UP: Follow-up in 4 weeks with labs and office visit.  Thank you for choosing [facility] to provide your oncology and hematology care.  To afford each patient quality time with our providers, please arrive at least 15 minutes before your scheduled appointment time.  With your help, our goal is to use those 15 minutes to complete the necessary work-up to ensure our physicians have the information they need to help with your evaluation and healthcare recommendations.    Effective January 1st, 2014, we ask that you re-schedule your appointment with our physicians should you arrive 10 or more minutes late for your appointment.  We strive to give you quality time with our providers, and arriving late affects you and other patients whose appointments are after yours.    Again, thank you for choosing [facility]  Our hope is that these requests will decrease the amount of time that you wait before being seen by our physicians.       _____________________________________________________________  Should you have questions after your visit to [facility], please contact our office at [phone] between the hours of 8:30 a.m. and 4:30 p.m.  Voicemails left after 4:30 p.m. will not be returned until the  following business day.  For prescription refill requests, have your pharmacy contact our office with your prescription refill request.    _______________________________________________________________  We hope that we have given you very good care.  You may receive a patient satisfaction survey in the mail, please complete it and return it as soon as possible.  We value your feedback!  _______________________________________________________________  Have you asked about our STAR program?  STAR stands for Survivorship Training and Rehabilitation, and this is a nationally recognized cancer care program that focuses on survivorship and rehabilitation.  Cancer and cancer treatments may cause problems, such as, pain, making you feel tired and keeping you from doing the things that you need or want to do. Cancer rehabilitation can help. Our goal is to reduce these troubling effects and help you have the best quality of life possible.  You may receive a survey from a nurse that asks questions about your current state of health.  Based on the survey results, all eligible patients will be referred to the [facility] program for an evaluation so we can better serve you!  A frequently asked questions sheet is available upon request. Olaparib oral capsules What is this medicine? OLAPARIB (oh LA pa rib) is a chemotherapy drug. It targets specific enzymes within cancer cells and stops the cancer cell from growing. This medicine is used to treat ovarian cancer. This medicine may be used for other purposes; ask your health care provider or pharmacist if you have questions. COMMON BRAND NAME(S): Lynparza What should I tell my health care provider  before I take this medicine? They need to know if you have any of these conditions: -anemia -kidney disease -liver disease -lung disease -low blood counts, like low white cell, platelet, or red cell counts -an unusual or allergic reaction to olaparib, other medicines,  foods, dyes, or preservatives -pregnant or trying to get pregnant -breast-feeding How should I use this medicine? Take this medicine by mouth with a glass of water. Follow the directions on the prescription label. Do not cut, crush, or chew this medicine. You may take it with food. However, avoid grapefruit juice, grapefruit or [place] oranges while on this medicine. Take your medicine at regular intervals. Do not take it more often than directed. Do not stop taking except on your doctor's advice. Overdosage: If you think you've taken too much of this medicine contact a poison control center or emergency room at once. Overdosage: If you think you have taken too much of this medicine contact a poison control center or emergency room at once. NOTE: This medicine is only for you. Do not share this medicine with others. What if I miss a dose? If you miss a dose, take it as soon as you can. If it is almost time for your next dose, take only that dose. Do not take double or extra doses. What may interact with this medicine? -antiviral medicines for hepatitis, HIV or AIDS -aprepitant -boceprevir -bosentan -carbamazepine -certain medicines for fungal infections like fluconazole, ketoconazole, itraconazole, posaconazole, and voriconazole -certain medicines for infections, such as ciprofloxacin, clarithromycin, erythromycin, telithromycin -crizotinib -diltiazem -grapefruit juice -imatinib -modafinil -nafcillin -nefazodone -phenobarbital -phenytoin -rifampin -[place] oranges -St. John's Wort -telaprevir -verapamil This list may not describe all possible interactions. Give your health care provider a list of all the medicines, herbs, non-prescription drugs, or dietary supplements you use. Also tell them if you smoke, drink alcohol, or use illegal drugs. Some items may interact with your medicine. What should I watch for while using this medicine? This drug may make you feel generally unwell.  This is not uncommon, as chemotherapy can affect healthy cells as well as cancer cells. Report any side effects. Continue your course of treatment even though you feel ill unless your doctor tells you to stop. You will need blood work done while you are taking this medicine. This medicine may increase your risk to bruise or bleed. Call your doctor or health care professional if you notice any unusual bleeding. Call your doctor or health care professional for advice if you get a fever, chills or sore throat, or other symptoms of a cold or flu. Do not treat yourself. This drug decreases your body's ability to fight infections. Try to avoid being around people who are sick. If you are going to have surgery or any other procedures, tell your doctor you are taking this medicine. Do not become pregnant while taking this medicine. Women should inform their doctor if they wish to become pregnant or think they might be pregnant. There is a potential for serious side effects to an unborn child. Talk to your health care professional or pharmacist for more information. Women who are able to become pregnant should use effective birth control during treatment and for at least 1 month after receiving the last dose. Talk to your healthcare provider about birth control methods that may be right for you. Do not breast-feed an infant while taking this medicine. What side effects may I notice from receiving this medicine? Side effects that you should report to your doctor or health  care professional as soon as possible: -allergic reactions like skin rash, itching or hives, swelling of the face, lips, or tongue -breathing problems, like shortness of breath, cough, or wheezing -fever -low blood counts - this medicine may decrease the number of white blood cells, red blood cells and platelets. You may be at increased risk for infections and bleeding. -signs and symptoms of bleeding such as bloody or black, tarry stools; red or  dark-brown urine; spitting up blood or brown material that looks like coffee grounds; red spots on the skin; unusual bruising or bleeding from the eye, gums, or nose -signs and symptoms of a blood clot such as changes in vision; chest pain with breathing problems; severe, sudden headache; pain, swelling, warmth in the leg; trouble speaking; sudden numbness or weakness of the face, arm or leg -signs and symptoms of low magnesium like muscle cramps or muscle pain; tingling or tremors; muscle weakness; seizures; or fast, irregular heartbeat -signs and symptoms of infection like fever or chills; cough; sore throat; pain or trouble passing urine -weak or tired Side effects that usually do not require medical attention (Report these to your doctor or health care professional if they continue or are bothersome.): -changes in taste -diarrhea -headache -heartburn, indigestion -loss of appetite -muscle or joint pain -nausea/vomiting -runny nose -stomach pain -weight loss This list may not describe all possible side effects. Call your doctor for medical advice about side effects. You may report side effects to FDA at 1-800-FDA-1088. Where should I keep my medicine? Keep out of the reach of children. Store between 20 and 25 degrees C (68 and 77 degrees F). Do not store at temperatures greater than 40 degrees C (104 degrees F) and do not take this medicine if you think it may have been stored at a temperature greater than 104 degrees F. Throw away any unused medicine after the expiration date. NOTE: This sheet is a summary. It may not cover all possible information. If you have questions about this medicine, talk to your doctor, pharmacist, or health care provider.  2015, [employer]/Gold Standard. (2013-03-04 11:05:28)

## 2013-10-23 ENCOUNTER — Other Ambulatory Visit: Payer: Medicare HMO

## 2013-10-23 ENCOUNTER — Ambulatory Visit (HOSPITAL_BASED_OUTPATIENT_CLINIC_OR_DEPARTMENT_OTHER): Payer: Self-pay | Admitting: Genetic Counselor

## 2013-10-23 DIAGNOSIS — Z853 Personal history of malignant neoplasm of breast: Secondary | ICD-10-CM

## 2013-10-23 DIAGNOSIS — Z803 Family history of malignant neoplasm of breast: Secondary | ICD-10-CM

## 2013-10-23 DIAGNOSIS — C50919 Malignant neoplasm of unspecified site of unspecified female breast: Secondary | ICD-10-CM | POA: Insufficient documentation

## 2013-10-23 DIAGNOSIS — C569 Malignant neoplasm of unspecified ovary: Secondary | ICD-10-CM

## 2013-10-23 LAB — CA 125: CA 125: 2464.5 U/mL — ABNORMAL HIGH (ref 0.0–30.2)

## 2013-10-23 NOTE — Progress Notes (Signed)
HISTORY OF PRESENT ILLNESS: Dr. Nevada Crane requested a cancer genetics consultation for Rita Humphrey, a 73 y.o. female, due to a personal and family history of cancer.  Rita Humphrey presents to clinic today to discuss the possibility of a hereditary predisposition to cancer, genetic testing, and to further clarify her future cancer risks, as well as potential cancer risk for family members. Rita Humphrey was reportedly diagnosed with bilateral breast cancer at age 8. She is s/p bilateral mastectomy. She was more recently diagnosed with ovarian cancer and is currently undergoing treatment.   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   Peripheral vascular disease 2013    hx of DVT right leg   History of kidney stones    Anemia    Platelets decreased april 2014     1 transfusion given before chemo   Deafness in left ear    Numbness in feet    Numbness of fingers of both hands    Dizziness     occasional   History of breast cancer 12/07/2012    Past Surgical History  Procedure Laterality Date   Thyroid lobectomy     Foot surgery      2 spurs taken out of foot   Varicose vein surgery     Bladder tack surgery      Tonsillectomy     Mastectomy      bilateral    Laparotomy Bilateral 06/05/2012    Procedure: EXPLORATORY LAPAROTOMY WITH PELVIC TUMOR BIOPSY;  Surgeon: Imagene Gurney A. Alycia Rossetti, MD;  Location: [facility];  Service: Gynecology;  Laterality: Bilateral;   Abdominal hysterectomy  1980   Vascular surgery      veins stripped from both legs   Portacath placement Left 06/25/2012    Procedure: INSERTION PORT-A-CATH;  Surgeon: Jamesetta So, MD;  Location: [facility];   Service: General;  Laterality: Left;   Laparoscopy N/A 10/30/2012    Procedure: LAPAROSCOPY DIAGNOSTIC, EXPLORATORY LAPAROTOMY, BILATERAL SALPINGO-OOPHORECTOMY, TUMOR DEBULKING;  Surgeon: Imagene Gurney A. Alycia Rossetti, MD;  Location: [facility];  Service: Gynecology;  Laterality: N/A;   History   Social History   Marital Status: Widowed    Spouse Name: N/A    Number of Children: N/A   Years of Education: N/A   Social History Main Topics   Smoking status: Never Smoker    Smokeless tobacco: Never Used   Alcohol Use: No   Drug Use: No   Sexual Activity: No   Other Topics Concern   Not on file   Social History Narrative   No narrative on file     FAMILY HISTORY:  During the visit, a 4-generation pedigree was obtained. Significant diagnoses include the following:  Family History  Problem Relation Age of Onset   Cancer Mother 67    throat cancer, tobacco exposure   Hypertension Father    Heart attack Brother     5 brothers   Hypertension Sister    Cancer Other     niece through brother with breast cancer at unknown age    Rita Humphrey's ancestry is of Caucasian descent. There is  no known Jewish ancestry or consanguinity.  GENETIC COUNSELING ASSESSMENT: Rita Humphrey is a 73 y.o. female with a personal and family history of cancer suggestive of a hereditary predisposition to cancer. We, therefore, discussed and recommended the following at today's visit.   DISCUSSION: We reviewed the characteristics, features and inheritance patterns of hereditary cancer syndromes. We also discussed genetic testing, including the appropriate family members to test, the process of testing, insurance coverage and turn-around-time for results. We discussed the implications of a negative, positive and/or variant of uncertain significant result. We recommended Rita Humphrey pursue genetic testing for BRCA1 and BRCA2. If this testing is negative, we recommended Rita Humphrey pursue genetic testing for other  hereditary breast and ovarian cancer genes via the [employer] gene panel.   PLAN: Based on our above recommendation, Rita Humphrey wished to pursue genetic testing and the blood sample was drawn and will be sent to [facility] for analysis. Results for BCRA1 and BRCA2 should be available within approximately 2-3 weeks time, at which point they will be disclosed by telephone to Rita Humphrey, as will any additional recommendations warranted by these results. If a gene panel is subsequently pursued, then these results will be called out once available. We also encouraged Rita Humphrey to remain in contact with cancer genetics annually so that we can continuously update the family history and inform her of any changes in cancer genetics and testing that may be of benefit for this family. Ms.  Humphrey questions were answered to her satisfaction today. Our contact information was provided should additional questions or concerns arise.   Thank you for the referral and allowing [place] to share in the care of your patient.   The patient was seen for a total of 45 minutes in face-to-face genetic counseling.  This patient was discussed with Dr. Alycia Rossetti who agrees with the above.    _______________________________________________________________________ For Office Staff:  Number of people involved in session: 3 Was an Intern/ student involved with case: not applicable

## 2013-10-24 ENCOUNTER — Telehealth (HOSPITAL_COMMUNITY): Payer: Self-pay

## 2013-10-24 NOTE — Telephone Encounter (Signed)
Per daughter, Myrtis Hopping, her mother had the genetic testing done yesterday and they were told turn around time for test results was 2 - 3 weeks.

## 2013-10-30 ENCOUNTER — Ambulatory Visit (HOSPITAL_BASED_OUTPATIENT_CLINIC_OR_DEPARTMENT_OTHER): Payer: Medicare HMO | Admitting: Gynecologic Oncology

## 2013-10-30 ENCOUNTER — Encounter: Payer: Self-pay | Admitting: Gynecologic Oncology

## 2013-10-30 ENCOUNTER — Ambulatory Visit: Payer: Commercial Managed Care - HMO

## 2013-10-30 ENCOUNTER — Ambulatory Visit: Payer: Medicare HMO

## 2013-10-30 ENCOUNTER — Ambulatory Visit (HOSPITAL_COMMUNITY)
Admission: RE | Admit: 2013-10-30 | Discharge: 2013-10-30 | Disposition: A | Discharge status: Home / Self Care | Payer: Medicare HMO | Source: Ambulatory Visit | Attending: Gynecologic Oncology | Admitting: Gynecologic Oncology

## 2013-10-30 ENCOUNTER — Observation Stay (HOSPITAL_COMMUNITY)
Admission: AD | Admit: 2013-10-30 | Discharge: 2013-10-31 | Disposition: A | Discharge status: Home / Self Care | Payer: Medicare HMO | Source: Ambulatory Visit | Attending: Internal Medicine | Admitting: Internal Medicine

## 2013-10-30 ENCOUNTER — Encounter (HOSPITAL_COMMUNITY): Payer: Self-pay | Admitting: Internal Medicine

## 2013-10-30 VITALS — BP 146/93 | HR 101 | Temp 98.2°F | Resp 16 | Ht 63.0 in | Wt 192.5 lb

## 2013-10-30 DIAGNOSIS — I739 Peripheral vascular disease, unspecified: Secondary | ICD-10-CM | POA: Insufficient documentation

## 2013-10-30 DIAGNOSIS — K5900 Constipation, unspecified: Secondary | ICD-10-CM | POA: Diagnosis not present

## 2013-10-30 DIAGNOSIS — Z885 Allergy status to narcotic agent status: Secondary | ICD-10-CM | POA: Diagnosis not present

## 2013-10-30 DIAGNOSIS — E860 Dehydration: Secondary | ICD-10-CM | POA: Diagnosis not present

## 2013-10-30 DIAGNOSIS — R109 Unspecified abdominal pain: Secondary | ICD-10-CM | POA: Insufficient documentation

## 2013-10-30 DIAGNOSIS — D72829 Elevated white blood cell count, unspecified: Secondary | ICD-10-CM | POA: Insufficient documentation

## 2013-10-30 DIAGNOSIS — C786 Secondary malignant neoplasm of retroperitoneum and peritoneum: Secondary | ICD-10-CM

## 2013-10-30 DIAGNOSIS — Z90710 Acquired absence of both cervix and uterus: Secondary | ICD-10-CM | POA: Diagnosis not present

## 2013-10-30 DIAGNOSIS — J45909 Unspecified asthma, uncomplicated: Secondary | ICD-10-CM | POA: Diagnosis not present

## 2013-10-30 DIAGNOSIS — K219 Gastro-esophageal reflux disease without esophagitis: Secondary | ICD-10-CM | POA: Insufficient documentation

## 2013-10-30 DIAGNOSIS — Z853 Personal history of malignant neoplasm of breast: Secondary | ICD-10-CM | POA: Diagnosis not present

## 2013-10-30 DIAGNOSIS — F3289 Other specified depressive episodes: Secondary | ICD-10-CM | POA: Insufficient documentation

## 2013-10-30 DIAGNOSIS — Z9010 Acquired absence of unspecified breast and nipple: Secondary | ICD-10-CM | POA: Insufficient documentation

## 2013-10-30 DIAGNOSIS — I10 Essential (primary) hypertension: Secondary | ICD-10-CM | POA: Diagnosis not present

## 2013-10-30 DIAGNOSIS — Z79899 Other long term (current) drug therapy: Secondary | ICD-10-CM | POA: Diagnosis not present

## 2013-10-30 DIAGNOSIS — G609 Hereditary and idiopathic neuropathy, unspecified: Secondary | ICD-10-CM | POA: Insufficient documentation

## 2013-10-30 DIAGNOSIS — F32A Depression, unspecified: Secondary | ICD-10-CM | POA: Diagnosis present

## 2013-10-30 DIAGNOSIS — Z86718 Personal history of other venous thrombosis and embolism: Secondary | ICD-10-CM | POA: Diagnosis not present

## 2013-10-30 DIAGNOSIS — Z888 Allergy status to other drugs, medicaments and biological substances status: Secondary | ICD-10-CM | POA: Diagnosis not present

## 2013-10-30 DIAGNOSIS — H918X9 Other specified hearing loss, unspecified ear: Secondary | ICD-10-CM | POA: Diagnosis not present

## 2013-10-30 DIAGNOSIS — E785 Hyperlipidemia, unspecified: Secondary | ICD-10-CM | POA: Diagnosis not present

## 2013-10-30 DIAGNOSIS — F329 Major depressive disorder, single episode, unspecified: Secondary | ICD-10-CM | POA: Diagnosis present

## 2013-10-30 DIAGNOSIS — E079 Disorder of thyroid, unspecified: Secondary | ICD-10-CM | POA: Insufficient documentation

## 2013-10-30 DIAGNOSIS — C569 Malignant neoplasm of unspecified ovary: Secondary | ICD-10-CM

## 2013-10-30 DIAGNOSIS — M129 Arthropathy, unspecified: Secondary | ICD-10-CM | POA: Diagnosis not present

## 2013-10-30 DIAGNOSIS — N9489 Other specified conditions associated with female genital organs and menstrual cycle: Secondary | ICD-10-CM | POA: Diagnosis not present

## 2013-10-30 DIAGNOSIS — Z9089 Acquired absence of other organs: Secondary | ICD-10-CM | POA: Diagnosis not present

## 2013-10-30 DIAGNOSIS — Z880 Allergy status to penicillin: Secondary | ICD-10-CM | POA: Diagnosis not present

## 2013-10-30 DIAGNOSIS — R112 Nausea with vomiting, unspecified: Secondary | ICD-10-CM | POA: Diagnosis not present

## 2013-10-30 LAB — TSH: TSH: 2.14 u[IU]/mL (ref 0.350–4.500)

## 2013-10-30 LAB — URINALYSIS, ROUTINE W REFLEX MICROSCOPIC
Bilirubin Urine: NEGATIVE
Glucose, UA: NEGATIVE mg/dL
HGB URINE DIPSTICK: NEGATIVE
Ketones, ur: NEGATIVE mg/dL
Nitrite: NEGATIVE
PROTEIN: NEGATIVE mg/dL
Specific Gravity, Urine: 1.013 (ref 1.005–1.030)
UROBILINOGEN UA: 0.2 mg/dL (ref 0.0–1.0)
pH: 6 (ref 5.0–8.0)

## 2013-10-30 LAB — CBC WITH DIFFERENTIAL/PLATELET
BASO%: 0.5 % (ref 0.0–2.0)
BASOS ABS: 0.1 10*3/uL (ref 0.0–0.1)
EOS%: 0.2 % (ref 0.0–7.0)
Eosinophils Absolute: 0 10*3/uL (ref 0.0–0.5)
HEMATOCRIT: 47.1 % — AB (ref 34.8–46.6)
HEMOGLOBIN: 15.1 g/dL (ref 11.6–15.9)
LYMPH%: 14.8 % (ref 14.0–49.7)
MCH: 27.4 pg (ref 25.1–34.0)
MCHC: 31.9 g/dL (ref 31.5–36.0)
MCV: 85.7 fL (ref 79.5–101.0)
MONO#: 0.6 10*3/uL (ref 0.1–0.9)
MONO%: 5.3 % (ref 0.0–14.0)
NEUT%: 79.2 % — AB (ref 38.4–76.8)
NEUTROS ABS: 9.4 10*3/uL — AB (ref 1.5–6.5)
Platelets: 358 10*3/uL (ref 145–400)
RBC: 5.5 10*6/uL — ABNORMAL HIGH (ref 3.70–5.45)
RDW: 15.5 % — ABNORMAL HIGH (ref 11.2–14.5)
WBC: 11.9 10*3/uL — ABNORMAL HIGH (ref 3.9–10.3)
lymph#: 1.8 10*3/uL (ref 0.9–3.3)

## 2013-10-30 LAB — BASIC METABOLIC PANEL (CC13)
Anion Gap: 10 mEq/L (ref 3–11)
BUN: 14.3 mg/dL (ref 7.0–26.0)
CALCIUM: 9.8 mg/dL (ref 8.4–10.4)
CHLORIDE: 104 meq/L (ref 98–109)
CO2: 27 meq/L (ref 22–29)
Creatinine: 0.9 mg/dL (ref 0.6–1.1)
GLUCOSE: 132 mg/dL (ref 70–140)
POTASSIUM: 4.3 meq/L (ref 3.5–5.1)
SODIUM: 141 meq/L (ref 136–145)

## 2013-10-30 LAB — PHOSPHORUS: Phosphorus: 2.7 mg/dL (ref 2.3–4.6)

## 2013-10-30 LAB — CBC
HEMATOCRIT: 44.2 % (ref 36.0–46.0)
Hemoglobin: 14.3 g/dL (ref 12.0–15.0)
MCH: 27.5 pg (ref 26.0–34.0)
MCHC: 32.4 g/dL (ref 30.0–36.0)
MCV: 85 fL (ref 78.0–100.0)
PLATELETS: 327 10*3/uL (ref 150–400)
RBC: 5.2 MIL/uL — ABNORMAL HIGH (ref 3.87–5.11)
RDW: 14.9 % (ref 11.5–15.5)
WBC: 11.7 10*3/uL — AB (ref 4.0–10.5)

## 2013-10-30 LAB — CREATININE, SERUM
Creatinine, Ser: 0.81 mg/dL (ref 0.50–1.10)
GFR, EST AFRICAN AMERICAN: 82 mL/min — AB (ref >90)
GFR, EST NON AFRICAN AMERICAN: 70 mL/min — AB (ref >90)

## 2013-10-30 LAB — URINE MICROSCOPIC-ADD ON

## 2013-10-30 LAB — MAGNESIUM: Magnesium: 1.8 mg/dL (ref 1.5–2.5)

## 2013-10-30 MED ORDER — OXYCODONE-ACETAMINOPHEN 5-325 MG PO TABS: 1.0000 | ORAL_TABLET | ORAL | Status: DC | PRN

## 2013-10-30 MED ORDER — PROCHLORPERAZINE MALEATE 10 MG PO TABS
10.0000 mg | ORAL_TABLET | Freq: Four times a day (QID) | ORAL | Status: DC | PRN
  Filled 2013-10-30: qty 1

## 2013-10-30 MED ORDER — DOCUSATE SODIUM 100 MG PO CAPS
100.0000 mg | ORAL_CAPSULE | Freq: Two times a day (BID) | ORAL | Status: DC
  Administered 2013-10-30 – 2013-10-31 (×2): 100 mg via ORAL
  Filled 2013-10-30 (×3): qty 1

## 2013-10-30 MED ORDER — AMLODIPINE BESYLATE 10 MG PO TABS
10.0000 mg | ORAL_TABLET | Freq: Every day | ORAL | Status: DC
  Administered 2013-10-31: 10 mg via ORAL
  Filled 2013-10-30: qty 1

## 2013-10-30 MED ORDER — TEMAZEPAM 15 MG PO CAPS: 15.0000 mg | ORAL_CAPSULE | Freq: Every evening | ORAL | Status: DC | PRN

## 2013-10-30 MED ORDER — ONDANSETRON HCL 4 MG PO TABS: 4.0000 mg | ORAL_TABLET | Freq: Four times a day (QID) | ORAL | Status: DC | PRN

## 2013-10-30 MED ORDER — PANTOPRAZOLE SODIUM 40 MG PO TBEC
40.0000 mg | DELAYED_RELEASE_TABLET | Freq: Two times a day (BID) | ORAL | Status: DC
  Administered 2013-10-30 – 2013-10-31 (×3): 40 mg via ORAL
  Filled 2013-10-30 (×4): qty 1

## 2013-10-30 MED ORDER — SODIUM CHLORIDE 0.9 % IV SOLN
INTRAVENOUS | Status: DC
  Administered 2013-10-30: 75 mL/h via INTRAVENOUS
  Administered 2013-10-31: 05:00:00 via INTRAVENOUS

## 2013-10-30 MED ORDER — ACETAMINOPHEN 650 MG RE SUPP: 650.0000 mg | Freq: Four times a day (QID) | RECTAL | Status: DC | PRN

## 2013-10-30 MED ORDER — POLYETHYLENE GLYCOL 3350 17 G PO PACK
17.0000 g | PACK | Freq: Every day | ORAL | Status: DC
  Administered 2013-10-30 – 2013-10-31 (×2): 17 g via ORAL
  Filled 2013-10-30 (×2): qty 1

## 2013-10-30 MED ORDER — LIDOCAINE-PRILOCAINE 2.5-2.5 % EX CREA: TOPICAL_CREAM | Freq: Once | CUTANEOUS | Status: DC

## 2013-10-30 MED ORDER — SODIUM CHLORIDE 0.9 % IJ SOLN
10.0000 mL | INTRAMUSCULAR | Status: DC | PRN
  Administered 2013-10-31 (×2): 10 mL

## 2013-10-30 MED ORDER — ACETAMINOPHEN 325 MG PO TABS: 650.0000 mg | ORAL_TABLET | Freq: Four times a day (QID) | ORAL | Status: DC | PRN

## 2013-10-30 MED ORDER — SUCRALFATE 1 GM/10ML PO SUSP
1.0000 g | Freq: Three times a day (TID) | ORAL | Status: DC
  Administered 2013-10-30 – 2013-10-31 (×3): 1 g via ORAL
  Filled 2013-10-30 (×9): qty 10

## 2013-10-30 MED ORDER — HYDROMORPHONE HCL PF 1 MG/ML IJ SOLN: 1.0000 mg | INTRAMUSCULAR | Status: DC | PRN

## 2013-10-30 MED ORDER — FLUTICASONE PROPIONATE 50 MCG/ACT NA SUSP
1.0000 | Freq: Every day | NASAL | Status: DC
  Administered 2013-10-30 – 2013-10-31 (×2): 1 via NASAL
  Filled 2013-10-30: qty 16

## 2013-10-30 MED ORDER — POTASSIUM CHLORIDE CRYS ER 20 MEQ PO TBCR
20.0000 meq | EXTENDED_RELEASE_TABLET | Freq: Every day | ORAL | Status: DC
  Administered 2013-10-30 – 2013-10-31 (×2): 20 meq via ORAL
  Filled 2013-10-30 (×2): qty 1

## 2013-10-30 MED ORDER — ONDANSETRON HCL 4 MG/2ML IJ SOLN
4.0000 mg | Freq: Four times a day (QID) | INTRAMUSCULAR | Status: DC | PRN
  Administered 2013-10-30: 4 mg via INTRAVENOUS
  Filled 2013-10-30: qty 2

## 2013-10-30 MED ORDER — NAPHAZOLINE HCL 0.1 % OP SOLN: 1.0000 [drp] | Freq: Four times a day (QID) | OPHTHALMIC | Status: DC | PRN

## 2013-10-30 MED ORDER — DULOXETINE HCL 30 MG PO CPEP
30.0000 mg | ORAL_CAPSULE | Freq: Every day | ORAL | Status: DC
  Administered 2013-10-30 – 2013-10-31 (×2): 30 mg via ORAL
  Filled 2013-10-30 (×2): qty 1

## 2013-10-30 MED ORDER — HEPARIN SODIUM (PORCINE) 5000 UNIT/ML IJ SOLN
5000.0000 [IU] | Freq: Three times a day (TID) | INTRAMUSCULAR | Status: DC
  Administered 2013-10-30 – 2013-10-31 (×3): 5000 [IU] via SUBCUTANEOUS
  Filled 2013-10-30 (×6): qty 1

## 2013-10-30 NOTE — H&P (Addendum)
[facility] History and Physical  MARCI POLITO KDX:[id number] DOB: 1940-04-19 DOA: 10/30/2013  Referring physician: Dr. Alycia Rossetti PCP: Delphina Cahill, MD   Chief Complaint: Abdominal pain, nausea, vomiting  HPI: Rita Humphrey is a 73 y.o. female with a medical history significant for hypertension, hyperlipidemia, history of thyroid disease, asthma, metastatic ovarian cancer, but he did on neuropathy (secondary to chemotherapy side effects), history of breast cancer and right lower extremity DVT in the past (2013); came to the hospital as dictated admission from Dr. Alycia Rossetti office at the [facility], secondary to abdominal pain, nausea/vomiting and inability to keep things down. Patient reports that for the last 2 days she had been experiencing abdominal discomfort, nausea, vomiting and difficulty keeping anything down (including medications). She also endorses having difficulties moving her bowels (which is not exactly new, but is worse than before now). Patient denies any fever, chills, chest pain, cough, dysuria, hematemesis, melena, hematochezia or any other acute complaints. [facility] has been asked to admit the patient for further evaluation and treatment. Abdominal x-rays has rule out SBO and is just demonstrating gas pattern consistently with constipation/obstipation. Blood work essentially normal except for hemoconcentration and mild leukocytosis.   Review of Systems:  Negative except as otherwise mentioned on history of present illness.  Past Medical History  Diagnosis Date  . Hypertension   . Hyperlipemia   . Thyroid disease   . Asthma   . Adnexal mass 05/16/2012  . 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 Cancer     [place]  . Breast cancer     s/p bilateral mastectomy [id number]  . Ovarian cancer 05/17/2012  . 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 Peripheral vascular disease 2013    hx of DVT right leg  . History of  kidney stones   . Anemia   . Platelets decreased april 2014     1 transfusion given before chemo  . Deafness in left ear   . Numbness in feet   . Numbness of fingers of both hands   . Dizziness     occasional  . History of breast cancer 12/07/2012   Past Surgical History  Procedure Laterality Date  . Thyroid lobectomy    . Foot surgery      2 spurs taken out of foot  . Varicose vein surgery    . Bladder tack surgery     . Tonsillectomy    . Mastectomy      bilateral   . Laparotomy Bilateral 06/05/2012    Procedure: EXPLORATORY LAPAROTOMY WITH PELVIC TUMOR BIOPSY;  Surgeon: Imagene Gurney A. Alycia Rossetti, MD;  Location: [facility];  Service: Gynecology;  Laterality: Bilateral;  . Abdominal hysterectomy  1980  . Vascular surgery      veins stripped from both legs  . Portacath placement Left 06/25/2012    Procedure: INSERTION PORT-A-CATH;  Surgeon: Jamesetta So, MD;  Location: [facility];  Service: General;  Laterality: Left;  . Laparoscopy N/A 10/30/2012    Procedure: LAPAROSCOPY DIAGNOSTIC, EXPLORATORY LAPAROTOMY, BILATERAL SALPINGO-OOPHORECTOMY, TUMOR DEBULKING;  Surgeon: Imagene Gurney A. Alycia Rossetti, MD;  Location: [facility];  Service: Gynecology;  Laterality: N/A;   Social History:  reports that she has never smoked. She has never used smokeless tobacco. She reports that she does not drink alcohol or use illicit drugs.  Allergies  Allergen Reactions  . Gabapentin Diarrhea  . Penicillins Anaphylaxis and Nausea And Vomiting  . Carboplatin   . Morphine  And Related Nausea And Vomiting    Family History  Problem Relation Age of Onset  . Cancer Mother 31    throat cancer, tobacco exposure  . Hypertension Father   . Heart attack Brother     5 brothers  . Hypertension Sister   . Cancer Other     niece through brother with breast cancer at unknown age     Prior to Admission medications   Medication Sig Start Date End Date Taking? Authorizing Provider  amLODipine (NORVASC) 10 MG tablet Take 10 mg by mouth daily  before breakfast.     Historical Provider, MD  benzonatate (TESSALON) 100 MG capsule Take 1 capsule (100 mg total) by mouth as needed. 09/10/13   Farrel Gobble, MD  Cholecalciferol (VITAMIN D3) 3000 UNITS TABS Take 2,000 Units by mouth daily.    Historical Provider, MD  Docusate Calcium (STOOL SOFTENER PO) Take 1 capsule by mouth daily.    Historical Provider, MD  DULoxetine (CYMBALTA) 30 MG capsule Take two capsules at bedtime for neuropathy symptoms or as directed. 03/06/13   Farrel Gobble, MD  fluticasone Asencion Islam) 50 MCG/ACT nasal spray 2 sprays in each nostril at bedtime 09/10/13   Farrel Gobble, MD  lidocaine-prilocaine (EMLA) cream Apply a quarter size amount to port site 1 hour prior to chemo. Do not rub in. Cover with plastic wrap. 12/13/12   Baird Cancer, PA-C  magnesium hydroxide (MILK OF MAGNESIA) 400 MG/5ML suspension Take 30 mLs by mouth daily as needed for mild constipation.    Historical Provider, MD  ondansetron (ZOFRAN) 8 MG tablet Take 8 mg by mouth as needed. 09/13/12   Historical Provider, MD  oxyCODONE-acetaminophen (PERCOCET/ROXICET) 5-325 MG per tablet Take 1 or 2 tablets every 4 hours as needed for pain 08/23/13   Farrel Gobble, MD  potassium chloride SA (K-DUR,KLOR-CON) 20 MEQ tablet Take 1 tablet (20 mEq total) by mouth daily. 07/08/13   Farrel Gobble, MD  prochlorperazine (COMPAZINE) 10 MG tablet Take 1 tablet (10 mg total) by mouth every 6 (six) hours as needed. 12/10/12   Farrel Gobble, MD  temazepam (RESTORIL) 15 MG capsule Take 15 mg by mouth at bedtime as needed for sleep.    Historical Provider, MD  Tetrahydroz-Dextran-PEG-Povid (VISINE ADVANCED RELIEF) 0.05-0.1-1-1 % SOLN Place 1 drop into both eyes daily as needed.    Historical Provider, MD   Physical Exam: Filed Vitals:   10/30/13 1217  BP: 143/83  Pulse: 95  Temp: 97.5 F (36.4 C)  TempSrc: Oral  Resp: 18  Height: 5\' 3"  (1.6 m)  Weight: 84.687 kg (186 lb 11.2 oz)  SpO2: 99%    Wt  Readings from Last 3 Encounters:  10/30/13 84.687 kg (186 lb 11.2 oz)  10/30/13 87.317 kg (192 lb 8 oz)  10/22/13 89.177 kg (196 lb 9.6 oz)    General:  Appears calm and comfortable; she is afebrile. Cooperative to examination and interview. Currently not complaining of any nausea. Patient reports having abdominal pain. Eyes: PERRL, normal lids, irises & conjunctiva; no icterus, no nystagmus ENT: Decrease hearing (left more than right), dry mucous membranes, no erythema or exudates inside her mouth; there is no drainage appreciated from ears or nostrils Neck: no LAD, masses or thyromegaly, no JVD Cardiovascular: Regular rate, no no rubs or gallops. No LE edema. Respiratory: CTA bilaterally, no w/r/r. Normal respiratory effort. Abdomen: soft, no distention, no guarding, mild discomfort with deep palpation; positive bowel sounds. Skin: no rash or induration seen on exam Musculoskeletal:  grossly normal tone BUE/BLE; no joint swelling, full range of motion. Psychiatric: grossly normal mood and affect, speech fluent and appropriate Neurologic: grossly non-focal.          Labs on Admission:  Basic Metabolic Panel:  Recent Labs Lab 10/30/13 1004  NA 141  K 4.3  CO2 27  GLUCOSE 132  BUN 14.3  CREATININE 0.9  CALCIUM 9.8   CBC:  Recent Labs Lab 10/30/13 1004  WBC 11.9*  NEUTROABS 9.4*  HGB 15.1  HCT 47.1*  MCV 85.7  PLT 358   Radiological Exams on Admission: Dg Abd 2 Views  10/30/2013   CLINICAL DATA:  Nausea and vomiting with mid to lower abdominal pain.  EXAM: ABDOMEN - 2 VIEW  COMPARISON:  None.  FINDINGS: A fair amount of stool is seen in the colon. Scattered minimally prominent loops of gas-filled small bowel in the upper abdomen. Gas is seen in the rectum. No free air. Lung bases are clear. Surgical clips along the right chest wall.  IMPRESSION: Bowel gas pattern is indicative of constipation.   Electronically Signed   By: Lorin Picket M.D.   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 -Patient with normal vital signs and normal potassium/creatinine. -Will admit to MedSurg bed and provide supportive care -Will give IV fluid resuscitation, as needed antiemetics and analgesics -will start bowel regimen -Will check TSH given chronic constipation -Follow clinical response. -Abdominal x-ray demonstrating just constipation; no SBO -Further treatment for ovarian cancer as per oncology service  2-HTN (hypertension): Stable and well controlled. Will continue current antihypertensive regimen.  3-Hyperlipemia: Patient was no actively taking any statins at home -Will check lipid profile  4-Ovarian cancer: With omentum metastasis -Further treatment to be dictated by oncology service -Continue as needed analgesic regimen as patient's abdominal pain could be secondary to metastatic ovarian cancer    5-Mild Dehydration: As mentioned above will provide fluid resuscitation. -As needed antiemetics -Will start with clear liquids and advance diet as tolerated  6-Leukocytosis, unspecified: Most likely the margination. -But will check urinalysis to be thorough  -no symptoms to suggest infection -lungs w/o infiltrates on abd-xray images  7-Unspecified constipation: Most likely secondary to the use of narcotics. -Will check TSH -Will start bowel regimen with the use of Colace 100 mg twice a day and MiraLAX twice a day  8-acute GERD without esophagitis: Worse with episode of vomiting throughout the night. Will use Protonix and Carafate  9-Nausea with vomiting: Will treat with zofran as needed. Compazine available for refractory symptoms  10-Depression and neuropathy: Continue Cymbalta  Oncology service has been made aware of patient's admission (Dr. Alycia Rossetti)  Code Status: Full DVT Prophylaxis:  Heparin Family Communication: Daughter at bedside Disposition Plan: MedSurg bed, length of stay less than to midnights; observation status.  Time spent: 50 minutes   Barton Dubois [facility] Pager [phone]  **Disclaimer: This note may have been dictated with voice recognition software. Similar sounding words can inadvertently be transcribed and this note may contain transcription errors which may not have been corrected upon publication of note.**

## 2013-10-30 NOTE — Progress Notes (Signed)
Consult Note: Gyn-Onc  Rita Humphrey 73 y.o. female  CC:  Chief Complaint  Patient presents with  . Follow-up    HPI: Rita Humphrey is a 73 year old, gravida 3 para 3, who had been experiencing abdominal pain since November of 2013. Since December, she had lost approximately 20 pounds and during that time, also noted early satiety and not be able to wear her clothes. She did not seek medical attention until February 2014. She underwent a CT scan of the abdomen and pelvis on March 10. It reveals a moderate amount of ascites or peritoneal cavity. There are multiple soft tissue lesions noted throughout the peritoneal cavity consistent with peritoneal implants of tumor with omental caking as well. The abdominal structures are unremarkable. The pelvis has ascites as well as multiple peritoneal implants of tumor. The prominent adnexal soft tissue masses bilaterally. On the left, a soft tissue mass in the left adnexa measures 7 x 4 cm with a right adnexal soft tissue mass measuring 3.8 x 2.4 cm. She underwent a paracentesis on March 11. Pathology revealed malignant cells consistent with carcinoma. The malignant cells are positive for cytokeratin 7 and show a morphologic and immunophenotypic finding consistent with a gynecologic primary. CA 125 has been drawn that was over 1000. Further noted, the patient has a personal history of breast cancer and has never received genetic testing until a few weeks ago. The results are pending.  She underwent exploratory laparotomy and peritoneal biopsies on June 05, 2012. Operative findings included 3.7 L of ascites along with diffuse carcinomatosis involving the small bowel mesentery with agglutination of the small bowel mesentery. There was complete coverage of the right hemidiaphragm with tumor. The transverse colon and omentum were diffusely replaced with tumor that was not resectable. The left upper quadrant was agglutinated with tumor and there is no large abdominal pelvic  mass in the left upper quadrant. There is diffuse subcentimeter disease covering almost the entire surface of the small bowel mesentery. The right ovary was agglutinated to the rectosigmoid colon with a thick rind of tumor connecting it. She was not deemed resectable. 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 She underwent 1 cycle of Taxotere and carboplatin based chemotherapy in the hospital.   After 3 cycles of chemotherapy, her CA 125 went from 1097 to 54.9 on June 4. On May 27th she had a CT scan that revealed: Findings: Lung bases: Bilateral mastectomy. Clear lung bases. Normal heart size without pericardial or pleural effusion. Abdomen/pelvis: Normal liver, spleen, stomach, pancreas, gallbladder, biliary tract, adrenal glands. Left renal angiomyolipoma of 9 mm. Bilateral renal sinus cysts, without hydronephrosis. No retroperitoneal or retrocrural adenopathy. Sigmoid diverticulosis with muscular hypertrophy. Normal terminal ileum. Normal caliber of small bowel loops. Nearly completely resolved abdominal ascites. There is minimal fluid identified adjacent the spleen on image 25/series 2 and along the right lobe of the liver on image 37/series 2. Slight decrease in omental nodularity/caking. This is ill-defined, but measures on the order of 8.0 x 3.6 cm on image 45/series 2. No pelvic adenopathy. Normal urinary bladder. Similar cul-de-sac small volume fluid, minimally eccentric left. Left adnexal mass measures 4.2 x 2.7 cm versus 7.0 x 4.0 cm on the prior. Right adnexal mass of 3.5 x 2.5 cm compares with 3.8 x 2.4 cm on the prior. Bones/Musculoskeletal: Mild osteopenia. L4-S1 disc bulges. IMPRESSION: 1. Overall response to therapy, with decreased omental/peritoneal disease and nearly completely resolved ascites. 2. Decreased size of  bilateral ovarian/adnexal masses. 3. Similar small volume cul-de-sac fluid. 4. Similar left renal  angiomyolipoma.   She had cycle #4 of chemotherapy on June 5. She's undergone the subsequent 3 cycles with carboplatin and Taxol. Her first cycle of chemotherapy was with carboplatin and Taxotere. She had a CT scan on July 28 that revealed: The urinary bladder appears normal. Previous hysterectomy. Left adnexal soft tissue structure measures 2.2 x 4.0 cm, image 76/series 2. The right adnexal soft tissue structure measures 2.3 x 3.1 cm, image 76/series 2. Previously 2.6 x 3.5 cm. Loculated fluid within the inferior left pelvis measures 3.5 x 2.5 cm, image 79/series 2. Previously 3.7 x 2.7 cm. Omental tumor is identified measuring 7.5 x 3.0 cm, image 43/series 2. Previously 7.9 x 3.3 cm. The abdominal aorta has a normal caliber. No aneurysm. No upper abdominal adenopathy identified. There is no enlarged pelvic or inguinal adenopathy identified. The stomach is normal. The small bowel loops are within normal limits. No evidence for small bowel obstruction. Normal caliber of the colon multiple distal colonic diverticula identified without acute inflammation. Review of the visualized bony structures is significant for mild lumbar spondylosis. No aggressive lytic or sclerotic bone lesions. IMPRESSION: 1. No acute findings. 2. Lateral mild improvement and peritoneal disease. No new or progressive disease identified. 3. No evidence for obstructive uropathy or bowel obstruction. Her CA 125 is normalized at 20.1.   On 10/30/12, she underwent a diagnostic laparoscopy, exploratory laparotomy, bilateral salpingo-oophorectomy, total omentectomy, and radical tumor debulking along with mobilization of splenic and hepatic flexure. Final pathology revealed: 1. Adnexa - ovary +/- tube, neoplastic, left - HIGH GRADE CARCINOMA, 2.3 CM, INVOLVING THE ADJACENT FALLOPIAN TUBE. - PLEASE SEE ONCOLOGY TEMPLATE FOR DETAILS. 2. Adnexa - ovary +/- tube, neoplastic, right - RESIDUAL HIGH GRADE CARCINOMA IN A BACKGROUND OF ENDOSALPINGIOSIS, 2.5  CM, INVOLVING THE OVARIAN CAPSULE - BENIGN FALLOPIAN TUBAL TISSUE WITH PARATUBAL CYSTS. 3. 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  She did have chemoresistance testing that showed responsiveness to carboplatin, cisplatin, docetaxel, etoposide and paclitaxel. She had intermediate response to doxorubicin, ifosfamide, Topotecan, and doublet therapy with carboplatin and Doxil, docetaxel and carboplatin gemcitabine, and carboplatin and paclitaxel. She was nonresponsive to gemcitabine. She was treated with 4 cycles of etoposide. After her chemotherapy she was then placed on maintenance Avastin. In March of 2015 her CA-125 was 39.5. In those consistently into her reach 524.6 on 08/24/2013. She had a CT scan on April 30 that revealed:  FINDINGS:  CT CHEST FINDINGS BILATERAL peripelvic renal cysts. Small angiomyolipoma posterior LEFT kidney 10 x 10 mm image 22. Remainder of liver, spleen, pancreas, kidneys, and adrenal glands normal appearance. Thoracic vascular structures patent on nondedicated exam. Azygos fissure noted. Surgical clips RIGHT axilla. No mediastinal, hilar, or axillary adenopathy. Few calcified pulmonary granulomata. Additional tiny nonspecific nodular foci in RIGHT lung are stable, not definitively calcified. No pulmonary infiltrate, pleural effusion, pneumothorax, or acute osseous findings. Bones demineralized.  CT ABDOMEN AND PELVIS FINDINGS Mild fatty infiltration of liver. BILATERAL peripelvic renal cysts. Small angiomyolipoma posterior LEFT kidney 10 x 10 mm, stable. Sigmoid diverticulosis without evidence of diverticulitis. Small supraumbilicalventral hernia containing a small portion of the mid transverse colon as a Richter hernia. No associated bowel wall thickening or evidence of obstruction.  No omental or mesenteric tumor nodularity or infiltration. Again identified small amount of fluid or thickening at the ventral abdominal surgical wound/scar  approximately 2.2 x 1.6 x 3.5 cm. Mild protrusion of  infraumbilical midline fascia but grossly intact without herniation. No mass, adenopathy, free fluid, or free air. Specifically, no gastrohepatic ligament adenopathy. Uterus surgically absent with nonvisualization of ovaries. Slightly decreased prominence of LEFT pelvic soft tissue density seen previously. Area of soft tissue density in the RIGHT pelvis 2.2 x 0.9 cm image 108, minimally more prominent though uncertain if represents residual tumor or slightly increased scarring. Bladder and ureters unremarkable. No acute osseous findings, with tiny bone island at L1 stable.  IMPRESSION:  -No acute intra thoracic abnormalities.  -Few tiny calcified and noncalcified pulmonary nodules stable since previous exam.  -ILATERAL parapelvic renal cysts and small LEFT renal angiomyolipoma.  -Small supraumbilical Richter hernia containing a small portion of the mid transverse colon without bowel wall thickening or obstruction.  -Resolution of omental and mesenteric tumor infiltration since 10/01/2012.  -Areas soft tissue in the RIGHT pelvis is minimal or prominent than on the previous exam, approximately 2.2 x 0.9 cm, uncertain if represents progressive scarring, difficult to exclude residual tumor though this the demonstrated no FDG localization on PET-CT ; recommend attention on followup exams.  She had a PET scan on June 10 that revealed: FINDINGS:  NECK  No hypermetabolic cervical lymph nodes are identified.There are no lesions of the pharyngeal mucosal space.  CHEST  There are no hypermetabolic mediastinal, hilar or axillary lymph nodes. There is no hypermetabolic pulmonary activity. There are stable surgical clips in the right axilla, a stable left subclavian Port-A-Cath and azygos fissure. Mild emphysematous changes within the lungs are stable.  ABDOMEN/PELVIS  There is no hypermetabolic activity within the liver, adrenal glands, spleen or pancreas.  There is no hypermetabolic nodal activity. There is no abnormal activity within the right pelvis to correspond with the soft tissue nodule questioned on the recent pelvic CT (image 169). There is no suspicious omental nodularity or ascites. Hepatic steatosis, bilateral renal parapelvic cysts, left renal angiomyolipoma, sigmoid diverticulosis and postsurgical changes within the anterior abdominal wall are stable.  SKELETON  There is no hypermetabolic activity to suggest osseous metastatic disease.  IMPRESSION:  Stable examination without evidence of recurrent ovarian cancer. Incidental findings described above are stable.  Repeat PET scan 10/03/13 revealed: IMPRESSION:  1. New peritoneal spread of tumor, with bandlike thickening along the paracolic gutters (left greater than right) and in the lower pelvis with high metabolic activity compatible with malignancy.  2. Asymmetric high activity in the right lingual tonsil compared to the left, probably incidental, but may merit ENT referral/direct visualization.  3. Ancillary findings include a left renal angiomyolipoma and sigmoid diverticulosis.  Most recent CA-125 2464.  She comes in the clinic when she's not feeling very well. She low-grade abdominal pain for a few days but yesterday her pain increased. She is shooting pain. Last night she had significant episodes of nausea with emesis. Should her normal bowel movement yesterday but is not recall passing gas since that time. She's had a cough has been productive of clear fluid. No associated chest pain. She has some chills but no fevers. There are no other family members at home 6. She's not eat any food such is particularly concerned about.   Review of Systems  Constitutional:   Denies fever. Skin: No rash Cardiovascular: No chest pain, shortness of breath, or edema  Pulmonary: + cough  Gastro Intestinal: + nausea, + vomiting, constipation, or diarrhea reported. No bright red blood per rectum or  change in bowel movement.  Genitourinary: No frequency Musculoskeletal: No myalgia, +arthralgia,+ joint swelling, +  pain.  Neurologic: No weakness, + numbness Psychology: No changes   Current Meds:  Outpatient Encounter Prescriptions as of 10/30/2013  Medication Sig  . amLODipine (NORVASC) 10 MG tablet Take 10 mg by mouth daily before breakfast.   . benzonatate (TESSALON) 100 MG capsule Take 1 capsule (100 mg total) by mouth as needed.  . Cholecalciferol (VITAMIN D3) 3000 UNITS TABS Take 2,000 Units by mouth daily.  Mariane Baumgarten Calcium (STOOL SOFTENER PO) Take 1 capsule by mouth daily.  . DULoxetine (CYMBALTA) 30 MG capsule Take two capsules at bedtime for neuropathy symptoms or as directed.  . fluticasone (FLONASE) 50 MCG/ACT nasal spray 2 sprays in each nostril at bedtime  . lidocaine-prilocaine (EMLA) cream Apply a quarter size amount to port site 1 hour prior to chemo. Do not rub in. Cover with plastic wrap.  . magnesium hydroxide (MILK OF MAGNESIA) 400 MG/5ML suspension Take 30 mLs by mouth daily as needed for mild constipation.  . ondansetron (ZOFRAN) 8 MG tablet Take 8 mg by mouth as needed.  Marland Kitchen oxyCODONE-acetaminophen (PERCOCET/ROXICET) 5-325 MG per tablet Take 1 or 2 tablets every 4 hours as needed for pain  . potassium chloride SA (K-DUR,KLOR-CON) 20 MEQ tablet Take 1 tablet (20 mEq total) by mouth daily.  . prochlorperazine (COMPAZINE) 10 MG tablet Take 1 tablet (10 mg total) by mouth every 6 (six) hours as needed.  . temazepam (RESTORIL) 15 MG capsule Take 15 mg by mouth at bedtime as needed for sleep.  Sallye Lat (VISINE ADVANCED RELIEF) 0.05-0.1-1-1 % SOLN Place 1 drop into both eyes daily as needed.    Allergy:  Allergies  Allergen Reactions  . Gabapentin Diarrhea  . Penicillins Anaphylaxis and Nausea And Vomiting  . Carboplatin   . Morphine And Related Nausea And Vomiting    Social Hx:   History   Social History  . Marital Status: Widowed     Spouse Name: N/A    Number of Children: N/A  . Years of Education: N/A   Occupational History  . Not on file.   Social History Main Topics  . Smoking status: Never Smoker   . Smokeless tobacco: Never Used  . Alcohol Use: No  . Drug Use: No  . Sexual Activity: No   Other Topics Concern  . Not on file   Social History Narrative  . No narrative on file    Past Surgical Hx:  Past Surgical History  Procedure Laterality Date  . Thyroid lobectomy    . Foot surgery      2 spurs taken out of foot  . Varicose vein surgery    . Bladder tack surgery     . Tonsillectomy    . Mastectomy      bilateral   . Laparotomy Bilateral 06/05/2012    Procedure: EXPLORATORY LAPAROTOMY WITH PELVIC TUMOR BIOPSY;  Surgeon: Imagene Gurney A. Alycia Rossetti, MD;  Location: [facility];  Service: Gynecology;  Laterality: Bilateral;  . Abdominal hysterectomy  1980  . Vascular surgery      veins stripped from both legs  . Portacath placement Left 06/25/2012    Procedure: INSERTION PORT-A-CATH;  Surgeon: Jamesetta So, MD;  Location: [facility];  Service: General;  Laterality: Left;  . Laparoscopy N/A 10/30/2012    Procedure: LAPAROSCOPY DIAGNOSTIC, EXPLORATORY LAPAROTOMY, BILATERAL SALPINGO-OOPHORECTOMY, TUMOR DEBULKING;  Surgeon: Imagene Gurney A. Alycia Rossetti, MD;  Location: [facility];  Service: Gynecology;  Laterality: N/A;    Past Medical Hx:  Past Medical History  Diagnosis Date  . Hypertension   .  Hyperlipemia   . Thyroid disease   . Asthma   . Adnexal mass 05/16/2012  . 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 Cancer     [place]  . Breast cancer     s/p bilateral mastectomy [id number]  . Ovarian cancer 05/17/2012  . 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 Peripheral vascular disease 2013    hx of DVT right leg  . History of kidney stones   . Anemia   . Platelets decreased april 2014     1 transfusion given before chemo  . Deafness in left ear   . Numbness in feet   . Numbness of  fingers of both hands   . Dizziness     occasional  . History of breast cancer 12/07/2012    Oncology Hx:    Ovarian cancer   05/15/2012 Initial Diagnosis [facility] ovarian cancer   06/05/2012 Surgery suboptimal debulking   06/07/2012 - 06/07/2012 Chemotherapy Carboplatin/Docetaxel as inpatient at [facility] by Dr. Beryle Beams.  1 cycle.   06/28/2012 - 08/09/2012 Chemotherapy Carboplatin/Paclitaxel x 3 cycles   08/30/2012 - 09/20/2012 Chemotherapy Carboplatin/Abraxane x 2 cycles (due to increasing peripheral neuropathy)   10/30/2012 Surgery exploratory laparotomy, optimal debulking. Tumor sent to [facility]   10/31/2012 Remission    12/17/2012 - 02/20/2013 Chemotherapy Etoposide x 4 cycles   03/19/2013 - 07/15/2013 Chemotherapy Avastin maintenance   08/24/2013 Progression Increasing CA-125, negative PET scan   10/03/2013 Progression PET scan- New peritoneal spread of tumor, with bandlike thickening along the paracolic gutters (left greater than right) and in the lower pelvis with high metabolic activity compatible with malignancy.     Family Hx:  Family History  Problem Relation Age of Onset  . Cancer Mother 40    throat cancer, tobacco exposure  . Hypertension Father   . Heart attack Brother     5 brothers  . Hypertension Sister   . Cancer Other     niece through brother with breast cancer at unknown age    58:  Blood pressure 146/93, pulse 101, temperature 98.2 F (36.8 C), temperature source Oral, resp. rate 16, height 5\' 3"  (1.6 m), weight 192 lb 8 oz (87.317 kg).  Physical Exam: Well-nourished well-developed female in no acute distress.  Abdomen: Well-healed vertical midline incision. Soft, tender with deep palpation in the left lower quadrant. Distractible. No fluid wave. No hepatomegaly. No palpable masses. + bowel sounds, no rebound, no guarding  Groins: No lymphadenopathy.  Extremities: 1+ nonpitting edema equal bilaterally. Well-healed surgical  incisions.   Assessment/Plan: 73 year old status post neoadjuvant chemotherapy and optimal cytoreduction after 7 cycles of taxanes and platinum based chemotherapy. 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 She also high-grade metastatic cancer within the omentum. Her tumor was sent for [facility]. Based on the [facility] assay she was treated with 4 cycles of etoposide. She was then placed on maintenance Avastin for November to May 2015. Her last cycle of a taxane and carboplatin was in July 2014 and her CA 125 began rising in March of 2015. She had persistent disease she is most likely has a  degree of platinum and taxanes resistance. I PET scan CA 125 she has recurrent disease. Today more concerned she might have a partial small bowel obstruction. We will send her for an abdominal x-ray series as well as labs. They will return  once those results are available and would be admitted her for fluids. We briefly does discuss laparoscopic evaluation. She had her genetic testing done last week the results are still pending. However, think we need to address the more acute issue today. She her family are very pleased that her moving forward with evaluation for abdominal pain today and are amenable to admission if necessary.  Cresencio Reesor A., MD 10/30/2013, 9:31 AM

## 2013-10-31 DIAGNOSIS — I10 Essential (primary) hypertension: Secondary | ICD-10-CM

## 2013-10-31 DIAGNOSIS — R112 Nausea with vomiting, unspecified: Secondary | ICD-10-CM | POA: Diagnosis not present

## 2013-10-31 DIAGNOSIS — D72829 Elevated white blood cell count, unspecified: Secondary | ICD-10-CM

## 2013-10-31 DIAGNOSIS — C786 Secondary malignant neoplasm of retroperitoneum and peritoneum: Secondary | ICD-10-CM

## 2013-10-31 LAB — URINE CULTURE: Colony Count: >=100000

## 2013-10-31 LAB — LIPID PANEL
CHOL/HDL RATIO: 4.4 ratio
CHOLESTEROL: 168 mg/dL (ref 0–200)
HDL: 38 mg/dL — ABNORMAL LOW (ref >39)
LDL Cholesterol: 104 mg/dL — ABNORMAL HIGH (ref 0–99)
Triglycerides: 130 mg/dL (ref <150)
VLDL: 26 mg/dL (ref 0–40)

## 2013-10-31 MED ORDER — HEPARIN SOD (PORK) LOCK FLUSH 100 UNIT/ML IV SOLN
500.0000 [IU] | INTRAVENOUS | Status: AC | PRN
  Administered 2013-10-31: 500 [IU]

## 2013-10-31 MED ORDER — PANTOPRAZOLE SODIUM 40 MG PO TBEC: 40.0000 mg | DELAYED_RELEASE_TABLET | Freq: Every day | ORAL | Status: DC

## 2013-10-31 MED ORDER — POLYETHYLENE GLYCOL 3350 17 G PO PACK: 17.0000 g | PACK | Freq: Two times a day (BID) | ORAL | Status: DC

## 2013-10-31 MED ORDER — ONDANSETRON 8 MG PO TBDP: 8.0000 mg | ORAL_TABLET | Freq: Three times a day (TID) | ORAL | Status: DC | PRN

## 2013-10-31 MED ORDER — DSS 100 MG PO CAPS: 100.0000 mg | ORAL_CAPSULE | Freq: Two times a day (BID) | ORAL | Status: DC

## 2013-10-31 MED ORDER — PROCHLORPERAZINE MALEATE 10 MG PO TABS: 10.0000 mg | ORAL_TABLET | Freq: Four times a day (QID) | ORAL | Status: DC | PRN

## 2013-10-31 NOTE — Progress Notes (Signed)
Gynecologic Oncology  Rita Humphrey 73 y.o. female  HPI:  Per Dr. Alycia Humphrey: 73 year old status post neoadjuvant chemotherapy and optimal cytoreduction after 7 cycles of taxanes and platinum based chemotherapy. 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 She also has high-grade metastatic cancer within the omentum. Her tumor was sent for precision therapeutics. Based on the precision therapeutics assay she was treated with 4 cycles of etoposide. She was then placed on maintenance Avastin for November to May 2015. Her last cycle of a taxane and carboplatin was in July 2014 and her CA 125 began rising in March of 2015. She had persistent disease and most likely has a degree of platinum and taxanes resistance.  Genetic testing pending.    Interval History:  She was seen by Dr. Alycia Humphrey on 10/30/13 in the office.  After evaluation, it was recommended that she be admitted to address dehydration, nausea/vomiting, and abdominal pain per Dr. Alycia Humphrey.  The development of symptoms began two days prior along with constipation, which was not a new finding for her.  Abdominal films were ordered to rule out SBO, which resulted a gas pattern consistent with constipation/obstipation.  Lab work resulted mild leukocytosis and mildly elevated hematocrit.  She was admitted by the [facility] for further evaluation.    Review of Systems: Constitutional: Feels "better than yesterday."  Denies fever, chills.  Cardiovascular: No chest pain, shortness of breath, or edema.  Pulmonary: No cough or wheeze.  Gastrointestinal: One episode of emesis yesterday evening.  No nausea or diarrhea this am. No bright red blood per rectum or change in bowel movement.  No bowel movement reported since admission  Genitourinary: No frequency, urgency, or dysuria. No vaginal bleeding or discharge.  Musculoskeletal: No myalgia or joint pain. Neurologic: No  weakness, numbness, or change in gait.  Psychology: No depression, anxiety, or insomnia.  Allergy:  Allergies  Allergen Reactions  . Gabapentin Diarrhea  . Penicillins Anaphylaxis and Nausea And Vomiting  . Carboplatin   . Morphine And Related Nausea And Vomiting    Social Hx:   History   Social History  . Marital Status: Widowed    Spouse Name: N/A    Number of Children: N/A  . Years of Education: N/A   Occupational History  . Not on file.   Social History Main Topics  . Smoking status: Never Smoker   . Smokeless tobacco: Never Used  . Alcohol Use: No  . Drug Use: No  . Sexual Activity: No   Other Topics Concern  . Not on file   Social History Narrative  . No narrative on file    Past Surgical Hx:  Past Surgical History  Procedure Laterality Date  . Thyroid lobectomy    . Foot surgery      2 spurs taken out of foot  . Varicose vein surgery    . Bladder tack surgery     . Tonsillectomy    . Mastectomy      bilateral   . Laparotomy Bilateral 06/05/2012    Procedure: EXPLORATORY LAPAROTOMY WITH PELVIC TUMOR BIOPSY;  Surgeon: Rita Gurney A. Rita Rossetti, MD;  Location: [facility];  Service: Gynecology;  Laterality: Bilateral;  . Abdominal hysterectomy  1980  . Vascular surgery      veins stripped from both legs  . Portacath placement Left 06/25/2012    Procedure: INSERTION PORT-A-CATH;  Surgeon: Jamesetta So, MD;  Location: [facility];  Service:  General;  Laterality: Left;  . Laparoscopy N/A 10/30/2012    Procedure: LAPAROSCOPY DIAGNOSTIC, EXPLORATORY LAPAROTOMY, BILATERAL SALPINGO-OOPHORECTOMY, TUMOR DEBULKING;  Surgeon: Rita Gurney A. Rita Rossetti, MD;  Location: [facility];  Service: Gynecology;  Laterality: N/A;    Past Medical Hx:  Past Medical History  Diagnosis Date  . Hypertension   . Hyperlipemia   . Thyroid disease   . Asthma   . Adnexal mass 05/16/2012  . 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 Cancer     [place]  .  Breast cancer     s/p bilateral mastectomy [id number]  . Ovarian cancer 05/17/2012  . 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 Peripheral vascular disease 2013    hx of DVT right leg  . History of kidney stones   . Anemia   . Platelets decreased april 2014     1 transfusion given before chemo  . Deafness in left ear   . Numbness in feet   . Numbness of fingers of both hands   . Dizziness     occasional  . History of breast cancer 12/07/2012    Family Hx:  Family History  Problem Relation Age of Onset  . Cancer Mother 47    throat cancer, tobacco exposure  . Hypertension Father   . Heart attack Brother     5 brothers  . Hypertension Sister   . Cancer Other     niece through brother with breast cancer at unknown age    75:  Blood pressure 122/63, pulse 78, temperature 98.1 F (36.7 C), temperature source Oral, resp. rate 18, height 5\' 3"  (1.6 m), weight 186 lb 11.2 oz (84.687 kg), SpO2 91.00%.  Physical Exam:  General: Well developed, well nourished female in no acute distress. Alert and oriented x 3.  Abdomen: Abdomen soft, non-tender and obese. Active bowel sounds in all quadrants.  Extremities: No bilateral cyanosis, edema, or clubbing.   Assessment/Plan:   CROSS, Rita DEAL, NP 10/31/2013, 1:50 PM  Patient was seen and examined by me, and I agree with the above history and examination findings. I performed a history, physical examination, and personally reviewed the patient's imaging films including abdominal films showing partial SBO in the setting of recurrent platinum resistant ovarian cancer. Her bowel obstruction appears to be resolving with bowel rest. She is passing flatus and has a nontender exam. Recommend advancing diet at her tolerance. Plan for discharge today if she tolerates PO without emesis. Donaciano Eva, MD

## 2013-10-31 NOTE — Progress Notes (Signed)
UR completed 

## 2013-10-31 NOTE — Discharge Summary (Signed)
Physician Discharge Summary  Rita Humphrey ZOX:[id number] DOB: 11/07/1940 DOA: 10/30/2013  PCP: Delphina Cahill, MD  Admit date: 10/30/2013 Discharge date: 10/31/2013  Time spent: >30 minutes  Recommendations for Outpatient Follow-up:  Check BMET to follow electrolytes and renal function Check CBC to follow WBC's trend Follow urine cx's results pending at discharge   Discharge Diagnoses:  Active Problems:   HTN (hypertension)   Hyperlipemia   Ovarian cancer   Dehydration   Leukocytosis, unspecified   Unspecified constipation   GERD without esophagitis   Nausea with vomiting   Depression   Nausea & vomiting   Discharge Condition: stable and improved. Will discharge home with family care. Follow up with oncology service as an outpatient and with PCP in 1 week  Diet recommendation: low sodium and increased fiber intake   Filed Weights   10/30/13 1217  Weight: 84.687 kg (186 lb 11.2 oz)    History of present illness:  73 y.o. female with a medical history significant for hypertension, hyperlipidemia, history of thyroid disease, asthma, metastatic ovarian cancer, but he did on neuropathy (secondary to chemotherapy side effects), history of breast cancer and right lower extremity DVT in the past (2013); came to the hospital as dictated admission from Dr. Alycia Rossetti office at the [facility], secondary to abdominal pain, nausea/vomiting and inability to keep things down. Patient reports that for the last 2 days she had been experiencing abdominal discomfort, nausea, vomiting and difficulty keeping anything down (including medications). She also endorses having difficulties moving her bowels (which is not exactly new, but is worse than before now). Patient denies any fever, chills, chest pain, cough, dysuria, hematemesis, melena, hematochezia or any other acute complaints. [facility] has been asked to admit the patient for further evaluation and treatment. Abdominal x-rays has rule out SBO  and is just demonstrating gas pattern consistently with constipation/obstipation. Blood work essentially normal except for hemoconcentration and mild leukocytosis.    Hospital Course:  1-abdominal pain, nausea/vomiting and dehydration: Appears to be secondary to obstipation and ongoing ovarian cancer with metastasis to the omentum.  -Patient with normal vital signs and normal potassium/creatinine at discharge.  -patient responded well to fluid resuscitation and supportive care -will discharge on colace and start bowel regimen  -TSH WNL -Abdominal x-ray demonstrating just constipation; no SBO  -Further treatment for ovarian cancer as per oncology service   2-HTN (hypertension): Stable and well controlled. Will continue current antihypertensive regimen.   3-Hyperlipemia: Patient was no actively taking any statins at home  -LDL 104  4-Ovarian cancer: With omentum metastasis  -Further treatment to be dictated by oncology service  -Continue as needed analgesic regimen, as patient's abdominal pain could be secondary to metastatic ovarian cancer   5-Mild Dehydration: improved/resolved with IVF's.  -continue As needed antiemetics  -encourage patient to keep herself hydrated and to follow small amount of food multiple times a day  6-Leukocytosis, unspecified: Most likely stress dismargination.  -UA with neg nitrate, no dysuria on exam -urine culture is pending -lungs w/o infiltrates on abd-xray images, no fever and no cough -PCP to follow urine cx's -no antibiotics given -WBC's trending down with hydration   7-Unspecified constipation: Most likely secondary to the use of narcotics.  -TSH WNL -Will start bowel regimen with the use of Colace 100 mg twice a day and MiraLAX 17 g twice a day  -patient advise to increase intake of fiber and to assure good hydration  8-acute GERD without esophagitis: Worse with episode of vomiting prior to  admission. -improved with carafate and PPI use during  admission -will discharge on protonix dailye   9-Nausea with vomiting: treated with zofran and compazine for refractory N/V episodes. -no further events since admission; tolerating diet at discharge  -will give prescription for zofran ODT  10-Depression and neuropathy: Continue Cymbalta   Procedures: See below for x-ray reports  Consultations:  Oncology (aware of admission; Dr. Alycia Rossetti)  Discharge Exam: Filed Vitals:   10/31/13 0600  BP: 122/63  Pulse: 78  Temp: 98.1 F (36.7 C)  Resp: 18    General: feeling better, no nausea, no vomiting since admission. Tolerating diet and PO meds. Patient is afebrile and will like to go home. Cardiovascular: S1 and S2, no rubs or gallops Respiratory: CTA bilaterally Abd: soft, mild left side tenderness with deep palpation, no guarding, no distension; positive BS Neuro: no new focal deficit.  Discharge Instructions You were cared for by a hospitalist during your hospital stay. If you have any questions about your discharge medications or the care you received while you were in the hospital after you are discharged, you can call the unit and asked to speak with the hospitalist on call if the hospitalist that took care of you is not available. Once you are discharged, your primary care physician will handle any further medical issues. Please note that NO REFILLS for any discharge medications will be authorized once you are discharged, as it is imperative that you return to your primary care physician (or establish a relationship with a primary care physician if you do not have one) for your aftercare needs so that they can reassess your need for medications and monitor your lab values.  Discharge Instructions   Diet - low sodium heart healthy    Complete by:  As directed      Discharge instructions    Complete by:  As directed   Keep yourself well hydrated Take medications as prescribed Please follow and increase intake in fiber and use prune  juice to help with bowel movement regimen Arrange follow up with PCP in 1 week            Medication List         amLODipine 10 MG tablet  Commonly known as:  NORVASC  Take 10 mg by mouth daily before breakfast.     benzonatate 100 MG capsule  Commonly known as:  TESSALON  Take 1 capsule (100 mg total) by mouth as needed.     DSS 100 MG Caps  Take 100 mg by mouth 2 (two) times daily.     DULoxetine 30 MG capsule  Commonly known as:  CYMBALTA  Take two capsules at bedtime for neuropathy symptoms or as directed.     fluticasone 50 MCG/ACT nasal spray  Commonly known as:  FLONASE  2 sprays in each nostril at bedtime     lidocaine-prilocaine cream  Commonly known as:  EMLA  Apply a quarter size amount to port site 1 hour prior to chemo. Do not rub in. Cover with plastic wrap.     magnesium hydroxide 400 MG/5ML suspension  Commonly known as:  MILK OF MAGNESIA  Take 30 mLs by mouth daily as needed for mild constipation.     ondansetron 8 MG disintegrating tablet  Commonly known as:  ZOFRAN ODT  Take 1 tablet (8 mg total) by mouth every 8 (eight) hours as needed for nausea or vomiting.     oxyCODONE-acetaminophen 5-325 MG per tablet  Commonly known as:  PERCOCET/ROXICET  Take 1 or 2 tablets every 4 hours as needed for pain     pantoprazole 40 MG tablet  Commonly known as:  PROTONIX  Take 1 tablet (40 mg total) by mouth daily.     polyethylene glycol packet  Commonly known as:  MIRALAX / GLYCOLAX  Take 17 g by mouth 2 (two) times daily.     potassium chloride SA 20 MEQ tablet  Commonly known as:  K-DUR,KLOR-CON  Take 1 tablet (20 mEq total) by mouth daily.     prochlorperazine 10 MG tablet  Commonly known as:  COMPAZINE  Take 1 tablet (10 mg total) by mouth every 6 (six) hours as needed for nausea, vomiting or refractory nausea / vomiting.     temazepam 15 MG capsule  Commonly known as:  RESTORIL  Take 15 mg by mouth at bedtime as needed for sleep.     VISINE  ADVANCED RELIEF 0.05-0.1-1-1 % Soln  Generic drug:  Tetrahydroz-Dextran-PEG-Povid  Place 1 drop into both eyes daily as needed.     Vitamin D-3 1000 UNITS Caps  Take 1 capsule by mouth daily.       Allergies  Allergen Reactions  . Gabapentin Diarrhea  . Penicillins Anaphylaxis and Nausea And Vomiting  . Carboplatin   . Morphine And Related Nausea And Vomiting       Follow-up Information   Follow up with Delphina Cahill, MD In 1 week.   Specialty:  Internal Medicine   Contact information:    [place] [id number] [phone]       The results of significant diagnostics from this hospitalization (including imaging, microbiology, ancillary and laboratory) are listed below for reference.    Significant Diagnostic Studies: Nm Pet Image Restag (ps) Skull Base To Thigh  10/03/2013   CLINICAL DATA:  Subsequent treatment strategy for ovarian cancer.  EXAM: NUCLEAR MEDICINE PET SKULL BASE TO THIGH  TECHNIQUE: 9.8 mCi F-18 FDG was injected intravenously. Full-ring PET imaging was performed from the skull base to thigh after the radiotracer. CT data was obtained and used for attenuation correction and anatomic localization.  FASTING BLOOD GLUCOSE:  Value: 99 mg/dl  COMPARISON:  08/14/2013; 03/05/2013  FINDINGS: NECK  Asymmetric plate high activity along the right lingual tonsil, standard uptake value 5.9 is compared to the contralateral side measurement of 4.2. This is relatively subtle and probably incidental.  Left thyroidectomy.  CHEST  No hypermetabolic mediastinal or hilar nodes. No suspicious pulmonary nodules on the CT scan. Azygos fissure noted. Prior right axillary lymph node dissection.  ABDOMEN/PELVIS  Scattered physiologic bowel activity is present. Unfortunately, there is also abnormal new nodular and bandlike activity in the left paracolic gutter. There is high activity along the adjacent: But most of the abnormal activity in this vicinity is clearly within the paracolic  band. Representative maximum standard uptake value in the left lower quadrant is 9.1 ; there is some similar abnormal high activity adjacent to the cecum with maximum standard uptake value 6.2, as well as abnormal activity in bandlike ascites just above the urinary bladder with maximum standard uptake value of approximately 18.5. Appearance compatible with peritoneal spread of ovarian metastatic disease.  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 Bilateral renal peripelvic cysts. Lower abdominal ventral hernia similar prior, likely containing postoperative fluid.  Sigmoid diverticulosis noted.  SKELETON  No focal hypermetabolic activity to suggest skeletal metastasis.  IMPRESSION: 1. New peritoneal spread of tumor, with bandlike thickening along the paracolic gutters (left greater  than right) and in the lower pelvis with high metabolic activity compatible with malignancy. 2. Asymmetric high activity in the right lingual tonsil compared to the left, probably incidental, but may merit ENT referral/direct visualization. 3. Ancillary findings include a left renal angiomyolipoma and sigmoid diverticulosis.   Electronically Signed   By: Sherryl Barters M.D.   On: 10/03/2013 15:20   Dg Abd 2 Views  10/30/2013   CLINICAL DATA:  Nausea and vomiting with mid to lower abdominal pain.  EXAM: ABDOMEN - 2 VIEW  COMPARISON:  None.  FINDINGS: A fair amount of stool is seen in the colon. Scattered minimally prominent loops of gas-filled small bowel in the upper abdomen. Gas is seen in the rectum. No free air. Lung bases are clear. Surgical clips along the right chest wall.  IMPRESSION: Bowel gas pattern is indicative of constipation.   Electronically Signed   By: Lorin Picket M.D.   On: 10/30/2013 10:43    Labs: Basic Metabolic Panel:  Recent Labs Lab 10/30/13 1004 10/30/13 1315  NA 141  --   K 4.3  --   CO2 27  --   GLUCOSE 132  --   BUN 14.3  --   CREATININE 0.9 0.81  CALCIUM 9.8  --   MG  --  1.8  PHOS  --  2.7    CBC:  Recent Labs Lab 10/30/13 1004 10/30/13 1315  WBC 11.9* 11.7*  NEUTROABS 9.4*  --   HGB 15.1 14.3  HCT 47.1* 44.2  MCV 85.7 85.0  PLT 358 327    Signed:  Barton Dubois  [facility] 10/31/2013, 10:36 AM

## 2013-11-09 ENCOUNTER — Encounter: Payer: Self-pay | Admitting: Genetic Counselor

## 2013-11-09 DIAGNOSIS — C569 Malignant neoplasm of unspecified ovary: Secondary | ICD-10-CM

## 2013-11-09 DIAGNOSIS — C50919 Malignant neoplasm of unspecified site of unspecified female breast: Secondary | ICD-10-CM

## 2013-11-09 DIAGNOSIS — Z803 Family history of malignant neoplasm of breast: Secondary | ICD-10-CM

## 2013-11-09 NOTE — Progress Notes (Signed)
Rita Humphrey recently had cancer genetic counseling at [facility] on October 23, 2013. At that time, it was recommended she pursue genetic testing. Her BRCA1 and BRCA2 gene test, which was performed at [facility], has returned and is negative for mutations. These results were disclosed to her today. Per her request, reflex testing for the OvaNext gene panel at [facility] was initiated. Results for the gene panel should be available in 2-3 more weeks and we will contact her to discuss these results and recommendations warranted by these results.

## 2013-11-12 ENCOUNTER — Telehealth: Payer: Self-pay | Admitting: Gynecologic Oncology

## 2013-11-12 NOTE — Telephone Encounter (Signed)
LM for her daughter Carleen to call me regarding next steps. BRCA testing negative. PG 

## 2013-11-13 ENCOUNTER — Telehealth: Payer: Self-pay | Admitting: Gynecologic Oncology

## 2013-11-13 NOTE — Telephone Encounter (Signed)
I spoke with her daughter yesterday and again today. I reviewed her chemotherapy resistance testing results. Sensitive to taxotere and paclitaxel and intermediate resistance to doxorubicin and topotecan. Having seen the patient last month and based on what her daughter states that her status is, I would lean towards weekly paclitaxel (need to assess neuropathy) or liposomal doxorubicin.  I have sent Dr. Barnet Glasgow and in basket message. The patient has an appointment with him next week on Tuesday. PG

## 2013-11-19 ENCOUNTER — Encounter (HOSPITAL_COMMUNITY): Payer: Self-pay

## 2013-11-19 ENCOUNTER — Encounter (HOSPITAL_COMMUNITY): Payer: Commercial Managed Care - HMO | Attending: Oncology

## 2013-11-19 ENCOUNTER — Encounter (HOSPITAL_BASED_OUTPATIENT_CLINIC_OR_DEPARTMENT_OTHER): Payer: Commercial Managed Care - HMO

## 2013-11-19 ENCOUNTER — Encounter: Payer: Self-pay | Admitting: *Deleted

## 2013-11-19 VITALS — BP 137/72 | HR 90 | Temp 97.8°F | Resp 18 | Wt 190.0 lb

## 2013-11-19 DIAGNOSIS — C50919 Malignant neoplasm of unspecified site of unspecified female breast: Secondary | ICD-10-CM

## 2013-11-19 DIAGNOSIS — C482 Malignant neoplasm of peritoneum, unspecified: Secondary | ICD-10-CM

## 2013-11-19 DIAGNOSIS — C569 Malignant neoplasm of unspecified ovary: Secondary | ICD-10-CM | POA: Insufficient documentation

## 2013-11-19 DIAGNOSIS — IMO0002 Reserved for concepts with insufficient information to code with codable children: Secondary | ICD-10-CM

## 2013-11-19 DIAGNOSIS — C786 Secondary malignant neoplasm of retroperitoneum and peritoneum: Secondary | ICD-10-CM

## 2013-11-19 DIAGNOSIS — G622 Polyneuropathy due to other toxic agents: Secondary | ICD-10-CM | POA: Diagnosis present

## 2013-11-19 DIAGNOSIS — Z23 Encounter for immunization: Secondary | ICD-10-CM

## 2013-11-19 LAB — CBC WITH DIFFERENTIAL/PLATELET
BASOS ABS: 0 10*3/uL (ref 0.0–0.1)
Basophils Relative: 0 % (ref 0–1)
EOS ABS: 0.2 10*3/uL (ref 0.0–0.7)
EOS PCT: 2 % (ref 0–5)
HEMATOCRIT: 43.7 % (ref 36.0–46.0)
Hemoglobin: 14.3 g/dL (ref 12.0–15.0)
LYMPHS ABS: 2.5 10*3/uL (ref 0.7–4.0)
LYMPHS PCT: 32 % (ref 12–46)
MCH: 28.5 pg (ref 26.0–34.0)
MCHC: 32.7 g/dL (ref 30.0–36.0)
MCV: 87.1 fL (ref 78.0–100.0)
MONO ABS: 0.6 10*3/uL (ref 0.1–1.0)
Monocytes Relative: 7 % (ref 3–12)
Neutro Abs: 4.7 10*3/uL (ref 1.7–7.7)
Neutrophils Relative %: 59 % (ref 43–77)
Platelets: 358 10*3/uL (ref 150–400)
RBC: 5.02 MIL/uL (ref 3.87–5.11)
RDW: 15.1 % (ref 11.5–15.5)
WBC: 7.9 10*3/uL (ref 4.0–10.5)

## 2013-11-19 LAB — COMPREHENSIVE METABOLIC PANEL
ALT: 18 U/L (ref 0–35)
ANION GAP: 12 (ref 5–15)
AST: 19 U/L (ref 0–37)
Albumin: 3.8 g/dL (ref 3.5–5.2)
Alkaline Phosphatase: 92 U/L (ref 39–117)
BUN: 15 mg/dL (ref 6–23)
CO2: 26 meq/L (ref 19–32)
CREATININE: 0.79 mg/dL (ref 0.50–1.10)
Calcium: 9.6 mg/dL (ref 8.4–10.5)
Chloride: 103 mEq/L (ref 96–112)
GFR calc Af Amer: >90 mL/min (ref >90)
GFR, EST NON AFRICAN AMERICAN: 81 mL/min — AB (ref >90)
Glucose, Bld: 95 mg/dL (ref 70–99)
Potassium: 4.2 mEq/L (ref 3.7–5.3)
Sodium: 141 mEq/L (ref 137–147)
Total Bilirubin: 0.5 mg/dL (ref 0.3–1.2)
Total Protein: 7.4 g/dL (ref 6.0–8.3)

## 2013-11-19 MED ORDER — METOCLOPRAMIDE HCL 10 MG PO TABS: 10.0000 mg | ORAL_TABLET | Freq: Three times a day (TID) | ORAL | Status: DC

## 2013-11-19 MED ORDER — INFLUENZA VAC SPLIT QUAD 0.5 ML IM SUSY: 0.5000 mL | PREFILLED_SYRINGE | Freq: Once | INTRAMUSCULAR | Status: DC

## 2013-11-19 MED ORDER — HEPARIN SOD (PORK) LOCK FLUSH 100 UNIT/ML IV SOLN
INTRAVENOUS | Status: AC
  Filled 2013-11-19: qty 5

## 2013-11-19 MED ORDER — HEPARIN SOD (PORK) LOCK FLUSH 100 UNIT/ML IV SOLN
500.0000 [IU] | Freq: Once | INTRAVENOUS | Status: AC
  Administered 2013-11-19: 500 [IU] via INTRAVENOUS

## 2013-11-19 MED ORDER — INFLUENZA VAC SPLIT QUAD 0.5 ML IM SUSY: 0.5000 mL | PREFILLED_SYRINGE | Freq: Once | INTRAMUSCULAR | Status: AC

## 2013-11-19 MED ORDER — INFLUENZA VAC SPLIT QUAD 0.5 ML IM SUSY
0.5000 mL | PREFILLED_SYRINGE | Freq: Once | INTRAMUSCULAR | Status: DC
  Administered 2013-11-19: 0.5 mL via INTRAMUSCULAR

## 2013-11-19 MED ORDER — SODIUM CHLORIDE 0.9 % IJ SOLN
10.0000 mL | INTRAMUSCULAR | Status: DC | PRN
  Administered 2013-11-19: 10 mL via INTRAVENOUS

## 2013-11-19 MED ORDER — INFLUENZA VAC SPLIT QUAD 0.5 ML IM SUSY
PREFILLED_SYRINGE | INTRAMUSCULAR | Status: AC
  Filled 2013-11-19: qty 0.5

## 2013-11-19 NOTE — Progress Notes (Signed)
Rita Humphrey  OFFICE PROGRESS NOTE  Rita Humphrey, Rita Humphrey [facility], [place]  Rita Humphrey [id number]  DIAGNOSIS: Ovarian cancer, unspecified laterality - Plan: CBC with Differential, Comprehensive metabolic panel, CA 211  Peripheral neuropathy, secondary to chemotherapy - Plan: CBC with Differential, Comprehensive metabolic panel, CA 941  Malignant neoplasm of breast (female), unspecified site - Plan: CBC with Differential, Comprehensive metabolic panel, CA 740  Chief Complaint  Patient presents with  . Ovarian Cancer    CURRENT THERAPY: Watchful expectation with a rising CA 125. Recently hospitalized on 10/31/2013 for obstipation and discharged the day after with causative agent probably opioid use.  INTERVAL HISTORY: Rita Humphrey 73 y.o. female returns for followup after GYN oncology evaluation in the setting of progressive elevation in CA 125 with negative PET CT scan while receiving Avastin maintenance therapy, since discontinued due to elevation in CA 125. Testing for BRCA positivity was negative. PET scan done on 10/03/2013 show progression of disease particularly in the paracolic gutters. Last CA 125 Performed on 10/22/2013 was 2464, today's report pending. She was admitted over night with dehydration and obstipation secondary to opioids in the latter part of August 2015. She has intermittent abdominal pain with one episode of vomiting but no diarrhea, melena, hematochezia, hematemesis, fever, or night sweats. Peripheral paresthesias persist.    MEDICAL HISTORY: Past Medical History  Diagnosis Date  . Hypertension   . Hyperlipemia   . Thyroid disease   . Asthma   . Adnexal mass 05/16/2012  . 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 Cancer     [place]  . Breast cancer     s/p bilateral mastectomy [id number]  . Ovarian cancer 05/17/2012  . 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 Peripheral vascular  disease 2013    hx of DVT right leg  . History of kidney stones   . Anemia   . Platelets decreased april 2014     1 transfusion given before chemo  . Deafness in left ear   . Numbness in feet   . Numbness of fingers of both hands   . Dizziness     occasional  . History of breast cancer 12/07/2012    INTERIM HISTORY: has HTN (hypertension); Hyperlipemia; Low back pain; Neck pain; Adnexal mass; Ovarian cancer; Omental metastasis; Peripheral neuropathy, secondary to chemotherapy; History of breast cancer; Osteoarthritis of right knee; Ovarian ca; Malignant neoplasm of breast (female), unspecified site; Family history of malignant neoplasm of breast; Dehydration; Leukocytosis, unspecified; Unspecified constipation; GERD without esophagitis; Nausea with vomiting; Depression; and Nausea & vomiting on her problem list.     Ovarian cancer    05/15/2012  Initial Diagnosis  [facility] ovarian cancer    06/05/2012  Surgery  suboptimal debulking    06/07/2012 - 06/07/2012  Chemotherapy  Carboplatin/Docetaxel as inpatient at [facility] by Dr. Beryle Beams. 1 cycle.    06/28/2012 - 08/09/2012  Chemotherapy  Carboplatin/Paclitaxel x 3 cycles    08/30/2012 - 09/20/2012  Chemotherapy  Carboplatin/Abraxane x 2 cycles (due to increasing peripheral neuropathy)    10/30/2012  Surgery  exploratory laparotomy, optimal debulking. [facility]    10/31/2012  Remission     12/17/2012 - 02/20/2013  Chemotherapy  Etoposide x 4 cycles    03/19/2013 -  Chemotherapy  Avastin maintenance   07/15/2013  CA125-103.6 CT-R pelvic mass 10/03/2013                     PET scan                 Positive in paracolic gutters  ALLERGIES:  is allergic to gabapentin; penicillins; carboplatin; and morphine and related.  MEDICATIONS: has a current medication list which includes the following prescription(s): amlodipine, benzonatate, vitamin d-3, dss, duloxetine, fluticasone, lidocaine-prilocaine, magnesium  hydroxide, metoclopramide, ondansetron, oxycodone-acetaminophen, pantoprazole, polyethylene glycol, potassium chloride sa, prochlorperazine, temazepam, and tetrahydroz-dextran-peg-povid, and the following Facility-Administered Medications: sodium chloride, sodium chloride, and sodium chloride.  SURGICAL HISTORY:  Past Surgical History  Procedure Laterality Date  . Thyroid lobectomy    . Foot surgery      2 spurs taken out of foot  . Varicose vein surgery    . Bladder tack surgery     . Tonsillectomy    . Mastectomy      bilateral   . Laparotomy Bilateral 06/05/2012    Procedure: EXPLORATORY LAPAROTOMY WITH PELVIC TUMOR BIOPSY;  Surgeon: Imagene Gurney A. Alycia Rossetti, MD;  Location: [facility];  Service: Gynecology;  Laterality: Bilateral;  . Abdominal hysterectomy  1980  . Vascular surgery      veins stripped from both legs  . Portacath placement Left 06/25/2012    Procedure: INSERTION PORT-A-CATH;  Surgeon: Jamesetta So, MD;  Location: [facility];  Service: General;  Laterality: Left;  . Laparoscopy N/A 10/30/2012    Procedure: LAPAROSCOPY DIAGNOSTIC, EXPLORATORY LAPAROTOMY, BILATERAL SALPINGO-OOPHORECTOMY, TUMOR DEBULKING;  Surgeon: Imagene Gurney A. Alycia Rossetti, MD;  Location: [facility];  Service: Gynecology;  Laterality: N/A;    FAMILY HISTORY: family history includes Cancer in her other; Cancer (age of onset: 9) in her mother; Heart attack in her brother; Hypertension in her father and sister.  SOCIAL HISTORY:  reports that she has never smoked. She has never used smokeless tobacco. She reports that she does not drink alcohol or use illicit drugs.  REVIEW OF SYSTEMS:  Other than that discussed above is noncontributory.  PHYSICAL EXAMINATION: ECOG PERFORMANCE STATUS: 1 - Symptomatic but completely ambulatory  There were no vitals taken for this visit.  GENERAL:alert, no distress and comfortable, in good spirits SKIN: skin color, texture, turgor are normal, no rashes or significant lesions EYES: PERLA; Conjunctiva  are pink and non-injected, sclera clear SINUSES: No redness or tenderness over maxillary or ethmoid sinuses OROPHARYNX:no exudate, no erythema on lips, buccal mucosa, or tongue. NECK: supple, thyroid normal size, non-tender, without nodularity. No masses CHEST: LifePort in place. No breast masses. LYMPH:  no palpable lymphadenopathy in the cervical, axillary or inguinal LUNGS: clear to auscultation and percussion with normal breathing effort HEART: regular rate & rhythm and no murmurs. ABDOMEN:abdomen soft, non-tender and normal bowel sounds. Slightly distended but no free fluid wave or shifting dullness. No masses are palpable. Liver spleen not large. MUSCULOSKELETAL:no cyanosis of digits and no clubbing. Range of motion normal.  NEURO: alert & oriented x 3 with fluent speech, no focal motor/sensory deficits   LABORATORY DATA: Infusion on 11/19/2013  Component Date Value Ref Range Status  . WBC 11/19/2013 7.9  4.0 - 10.5 K/uL Final  . RBC 11/19/2013 5.02  3.87 - 5.11 MIL/uL Final  . Hemoglobin 11/19/2013 14.3  12.0 - 15.0 g/dL Final  . HCT 11/19/2013 43.7  36.0 - 46.0 % Final  . MCV 11/19/2013 87.1  78.0 - 100.0 fL Final  . MCH 11/19/2013 28.5  26.0 - 34.0 pg  Final  . MCHC 11/19/2013 32.7  30.0 - 36.0 g/dL Final  . RDW 11/19/2013 15.1  11.5 - 15.5 % Final  . Platelets 11/19/2013 358  150 - 400 K/uL Final  . Neutrophils Relative % 11/19/2013 59  43 - 77 % Final  . Neutro Abs 11/19/2013 4.7  1.7 - 7.7 K/uL Final  . Lymphocytes Relative 11/19/2013 32  12 - 46 % Final  . Lymphs Abs 11/19/2013 2.5  0.7 - 4.0 K/uL Final  . Monocytes Relative 11/19/2013 7  3 - 12 % Final  . Monocytes Absolute 11/19/2013 0.6  0.1 - 1.0 K/uL Final  . Eosinophils Relative 11/19/2013 2  0 - 5 % Final  . Eosinophils Absolute 11/19/2013 0.2  0.0 - 0.7 K/uL Final  . Basophils Relative 11/19/2013 0  0 - 1 % Final  . Basophils Absolute 11/19/2013 0.0  0.0 - 0.1 K/uL Final  . Sodium 11/19/2013 141  137 - 147 mEq/L  Final  . Potassium 11/19/2013 4.2  3.7 - 5.3 mEq/L Final  . Chloride 11/19/2013 103  96 - 112 mEq/L Final  . CO2 11/19/2013 26  19 - 32 mEq/L Final  . Glucose, Bld 11/19/2013 95  70 - 99 mg/dL Final  . BUN 11/19/2013 15  6 - 23 mg/dL Final  . Creatinine, Ser 11/19/2013 0.79  0.50 - 1.10 mg/dL Final  . Calcium 11/19/2013 9.6  8.4 - 10.5 mg/dL Final  . Total Protein 11/19/2013 7.4  6.0 - 8.3 g/dL Final  . Albumin 11/19/2013 3.8  3.5 - 5.2 g/dL Final  . AST 11/19/2013 19  0 - 37 U/L Final  . ALT 11/19/2013 18  0 - 35 U/L Final  . Alkaline Phosphatase 11/19/2013 92  39 - 117 U/L Final  . Total Bilirubin 11/19/2013 0.5  0.3 - 1.2 mg/dL Final  . GFR calc non Af Amer 11/19/2013 81* >90 mL/min Final  . GFR calc Af Amer 11/19/2013 >90  >90 mL/min Final   Comment: (NOTE)                          The eGFR has been calculated using the CKD EPI equation.                          This calculation has not been validated in all clinical situations.                          eGFR's persistently <90 mL/min signify possible Chronic Kidney                          Disease.  . Anion gap 11/19/2013 12  5 - 15 Final  Admission on 10/30/2013, Discharged on 10/31/2013  Component Date Value Ref Range Status  . WBC 10/30/2013 11.7* 4.0 - 10.5 K/uL Final  . RBC 10/30/2013 5.20* 3.87 - 5.11 MIL/uL Final  . Hemoglobin 10/30/2013 14.3  12.0 - 15.0 g/dL Final  . HCT 10/30/2013 44.2  36.0 - 46.0 % Final  . MCV 10/30/2013 85.0  78.0 - 100.0 fL Final  . MCH 10/30/2013 27.5  26.0 - 34.0 pg Final  . MCHC 10/30/2013 32.4  30.0 - 36.0 g/dL Final  . RDW 10/30/2013 14.9  11.5 - 15.5 % Final  . Platelets 10/30/2013 327  150 - 400 K/uL Final  . Creatinine, Ser 10/30/2013 0.81  0.50 - 1.10 mg/dL Final  . GFR calc non Af Amer 10/30/2013 70* >90 mL/min Final  . GFR calc Af Amer 10/30/2013 82* >90 mL/min Final   Comment: (NOTE)                          The eGFR has been calculated using the CKD EPI equation.                           This calculation has not been validated in all clinical situations.                          eGFR's persistently <90 mL/min signify possible Chronic Kidney                          Disease.  . Magnesium 10/30/2013 1.8  1.5 - 2.5 mg/dL Final  . Phosphorus 10/30/2013 2.7  2.3 - 4.6 mg/dL Final  . TSH 10/30/2013 2.140  0.350 - 4.500 uIU/mL Final   Performed at [facility]  . Specimen Description 10/30/2013 URINE, RANDOM   Final  . Special Requests 10/30/2013 NONE   Final  . Culture  Setup Time 10/30/2013    Final                   Value:10/31/2013 01:35                         Performed at [facility]  . Colony Count 10/30/2013    Final                   Value:>=100,000 COLONIES/ML                         Performed at [facility]  . Culture 10/30/2013    Final                   Value:Multiple bacterial morphotypes present, none predominant. Suggest appropriate recollection if clinically indicated.                         Performed at [facility]  . Report Status 10/30/2013 10/31/2013 FINAL   Final  . Color, Urine 10/30/2013 YELLOW  YELLOW Final  . APPearance 10/30/2013 CLOUDY* CLEAR Final  . Specific Gravity, Urine 10/30/2013 1.013  1.005 - 1.030 Final  . pH 10/30/2013 6.0  5.0 - 8.0 Final  . Glucose, UA 10/30/2013 NEGATIVE  NEGATIVE mg/dL Final  . Hgb urine dipstick 10/30/2013 NEGATIVE  NEGATIVE Final  . Bilirubin Urine 10/30/2013 NEGATIVE  NEGATIVE Final  . Ketones, ur 10/30/2013 NEGATIVE  NEGATIVE mg/dL Final  . Protein, ur 10/30/2013 NEGATIVE  NEGATIVE mg/dL Final  . Urobilinogen, UA 10/30/2013 0.2  0.0 - 1.0 mg/dL Final  . Nitrite 10/30/2013 NEGATIVE  NEGATIVE Final  . Leukocytes, UA 10/30/2013 SMALL* NEGATIVE Final  . Cholesterol 10/31/2013 168  0 - 200 mg/dL Final  . Triglycerides 10/31/2013 130  <150 mg/dL Final  . HDL 10/31/2013 38* >39 mg/dL Final  . Total CHOL/HDL Ratio 10/31/2013 4.4   Final  . VLDL 10/31/2013 26  0 - 40 mg/dL  Final  . LDL Cholesterol 10/31/2013 104* 0 - 99 mg/dL Final   Comment:  Total Cholesterol/HDL:CHD Risk                          Coronary Heart Disease Risk Table                                              Men   Women                           1/2 Average Risk   3.4   3.3                           Average Risk       5.0   4.4                           2 X Average Risk   9.6   7.1                           3 X Average Risk  23.4   11.0                                                          Use the calculated Patient Ratio                          above and the CHD Risk Table                          to determine the patient's CHD Risk.                                                          ATP III CLASSIFICATION (LDL):                           <100     mg/dL   Optimal                           100-129  mg/dL   Near or Above                                             Optimal                           130-159  mg/dL   Borderline                           160-189  mg/dL   High                           >  190     mg/dL   Very High                          Performed at [facility]  . Squamous Epithelial / LPF 10/30/2013 RARE  RARE Final  . WBC, UA 10/30/2013 3-6  <3 WBC/hpf Final  . RBC / HPF 10/30/2013 0-2  <3 RBC/hpf Final  . Bacteria, UA 10/30/2013 FEW* RARE Final  . Urine-Other 10/30/2013 MUCOUS PRESENT   Final  Appointment on 10/30/2013  Component Date Value Ref Range Status  . Sodium 10/30/2013 141  136 - 145 mEq/L Final  . Potassium 10/30/2013 4.3  3.5 - 5.1 mEq/L Final  . Chloride 10/30/2013 104  98 - 109 mEq/L Final  . CO2 10/30/2013 27  22 - 29 mEq/L Final  . Glucose 10/30/2013 132  70 - 140 mg/dl Final  . BUN 10/30/2013 14.3  7.0 - 26.0 mg/dL Final  . Creatinine 10/30/2013 0.9  0.6 - 1.1 mg/dL Final  . Calcium 10/30/2013 9.8  8.4 - 10.4 mg/dL Final  . Anion Gap 10/30/2013 10  3 - 11 mEq/L Final  . WBC 10/30/2013 11.9* 3.9 - 10.3  10e3/uL Final  . NEUT# 10/30/2013 9.4* 1.5 - 6.5 10e3/uL Final  . HGB 10/30/2013 15.1  11.6 - 15.9 g/dL Final  . HCT 10/30/2013 47.1* 34.8 - 46.6 % Final  . Platelets 10/30/2013 358  145 - 400 10e3/uL Final  . MCV 10/30/2013 85.7  79.5 - 101.0 fL Final  . MCH 10/30/2013 27.4  25.1 - 34.0 pg Final  . MCHC 10/30/2013 31.9  31.5 - 36.0 g/dL Final  . RBC 10/30/2013 5.50* 3.70 - 5.45 10e6/uL Final  . RDW 10/30/2013 15.5* 11.2 - 14.5 % Final  . lymph# 10/30/2013 1.8  0.9 - 3.3 10e3/uL Final  . MONO# 10/30/2013 0.6  0.1 - 0.9 10e3/uL Final  . Eosinophils Absolute 10/30/2013 0.0  0.0 - 0.5 10e3/uL Final  . Basophils Absolute 10/30/2013 0.1  0.0 - 0.1 10e3/uL Final  . NEUT% 10/30/2013 79.2* 38.4 - 76.8 % Final  . LYMPH% 10/30/2013 14.8  14.0 - 49.7 % Final  . MONO% 10/30/2013 5.3  0.0 - 14.0 % Final  . EOS% 10/30/2013 0.2  0.0 - 7.0 % Final  . BASO% 10/30/2013 0.5  0.0 - 2.0 % Final  Office Visit on 10/22/2013  Component Date Value Ref Range Status  . WBC 10/22/2013 8.9  4.0 - 10.5 K/uL Final  . RBC 10/22/2013 4.79  3.87 - 5.11 MIL/uL Final  . Hemoglobin 10/22/2013 13.6  12.0 - 15.0 g/dL Final  . HCT 10/22/2013 41.7  36.0 - 46.0 % Final  . MCV 10/22/2013 87.1  78.0 - 100.0 fL Final  . MCH 10/22/2013 28.4  26.0 - 34.0 pg Final  . MCHC 10/22/2013 32.6  30.0 - 36.0 g/dL Final  . RDW 10/22/2013 14.9  11.5 - 15.5 % Final  . Platelets 10/22/2013 311  150 - 400 K/uL Final  . Neutrophils Relative % 10/22/2013 55  43 - 77 % Final  . Neutro Abs 10/22/2013 4.9  1.7 - 7.7 K/uL Final  . Lymphocytes Relative 10/22/2013 34  12 - 46 % Final  . Lymphs Abs 10/22/2013 3.1  0.7 - 4.0 K/uL Final  . Monocytes Relative 10/22/2013 9  3 - 12 % Final  . Monocytes Absolute 10/22/2013 0.8  0.1 - 1.0 K/uL Final  . Eosinophils Relative 10/22/2013 2  0 - 5 %  Final  . Eosinophils Absolute 10/22/2013 0.2  0.0 - 0.7 K/uL Final  . Basophils Relative 10/22/2013 0  0 - 1 % Final  . Basophils Absolute 10/22/2013 0.0  0.0 -  0.1 K/uL Final  . CA 125 10/22/2013 2464.5* 0.0 - 30.2 U/mL Final   Comment: (NOTE)                          Result repeated and verified.                          Result confirmed by automatic dilution.                          Performed at [facility]  . Sodium 10/22/2013 141  137 - 147 mEq/L Final  . Potassium 10/22/2013 4.6  3.7 - 5.3 mEq/L Final  . Chloride 10/22/2013 103  96 - 112 mEq/L Final  . CO2 10/22/2013 24  19 - 32 mEq/L Final  . Glucose, Bld 10/22/2013 90  70 - 99 mg/dL Final  . BUN 10/22/2013 18  6 - 23 mg/dL Final  . Creatinine, Ser 10/22/2013 0.80  0.50 - 1.10 mg/dL Final  . Calcium 10/22/2013 9.7  8.4 - 10.5 mg/dL Final  . Total Protein 10/22/2013 7.5  6.0 - 8.3 g/dL Final  . Albumin 10/22/2013 3.9  3.5 - 5.2 g/dL Final  . AST 10/22/2013 23  0 - 37 U/L Final  . ALT 10/22/2013 21  0 - 35 U/L Final  . Alkaline Phosphatase 10/22/2013 113  39 - 117 U/L Final  . Total Bilirubin 10/22/2013 0.3  0.3 - 1.2 mg/dL Final  . GFR calc non Af Amer 10/22/2013 71* >90 mL/min Final  . GFR calc Af Amer 10/22/2013 83* >90 mL/min Final   Comment: (NOTE)                          The eGFR has been calculated using the CKD EPI equation.                          This calculation has not been validated in all clinical situations.                          eGFR's persistently <90 mL/min signify possible Chronic Kidney                          Disease.  . Anion gap 10/22/2013 14  5 - 15 Final    PATHOLOGY: Plan laparoscopy with biopsy, tissue to be sent for molecular pathology.  Urinalysis    Component Value Date/Time   COLORURINE YELLOW 10/30/2013 1939   APPEARANCEUR CLOUDY* 10/30/2013 1939   LABSPEC 1.013 10/30/2013 1939   PHURINE 6.0 10/30/2013 1939   GLUCOSEU NEGATIVE 10/30/2013 1939   HGBUR NEGATIVE 10/30/2013 1939   BILIRUBINUR NEGATIVE 10/30/2013 1939   KETONESUR NEGATIVE 10/30/2013 1939   PROTEINUR NEGATIVE 10/30/2013 1939   UROBILINOGEN 0.2 10/30/2013 1939   NITRITE  NEGATIVE 10/30/2013 1939   LEUKOCYTESUR SMALL* 10/30/2013 1939    RADIOGRAPHIC STUDIES: Dg Abd 2 Views  10/30/2013   CLINICAL DATA:  Nausea and vomiting with mid to lower abdominal pain.  EXAM: ABDOMEN - 2 VIEW  COMPARISON:  None.  FINDINGS: A fair amount of stool is seen in the colon. Scattered minimally prominent loops of gas-filled small bowel in the upper abdomen. Gas is seen in the rectum. No free air. Lung bases are clear. Surgical clips along the right chest wall.  IMPRESSION: Bowel gas pattern is indicative of constipation.   Electronically Signed   By: Lorin Picket M.D.   On: 10/30/2013 10:43    ASSESSMENT:  #1. Primary peritoneal carcinomatosis presenting with inguinal lymphadenopathy, status post biopsy followed by 6 cycles of carboplatin and Taxol with laparoscopic complete response, tooth extraction, and institution of Avastin maintenance therapy 3 weeks ago, tolerated well, no evidence of disease on CT scan done 06/27/2013, negative PET/CT scan on 08/14/2013 despite elevation in CA 125 to 524 on 08/23/2013, please value 1093.6, most recent PET scan on 10/03/2013 positive for paracolic disease, worsening symptomatology.  #2.Residual peripheral neuropathy from previous treatment with Taxol.  #3. Status post tooth extraction with no complications.  #4. Cold intolerance, normal thyroid function.  #5. BRCA negative #6. Asymptomatic allergic rhinitis #7. 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    PLAN:  #1. Metoclopramide 10 mg 3 times a day before meals and at bedtime. #2. Ibuprofen every 4 hours by the clock and Percocet as needed for more severe pain. #3. Abdominal heating pad for palliative effect. #4. Hold Zofran Compazine for now. #5. Laparoscopy with Dr. Denman George for tissue to be sent for molecular analysis. #6. Would retreat with VP-16 if symptoms became worse before molecular pathology report is back. #7. Influenza virus vaccine was given today   All questions were answered. The  patient knows to call the clinic with any problems, questions or concerns. We can certainly see the patient much sooner if necessary.   I spent 40 minutes counseling the patient face to face. The total time spent in the appointment was 55 minutes.    Doroteo Bradford, MD 11/19/2013 1:28 PM  DISCLAIMER:  This note was dictated with voice recognition software.  Similar sounding words can inadvertently be transcribed inaccurately and may not be corrected upon review.

## 2013-11-19 NOTE — Progress Notes (Signed)
Please see office visit note.

## 2013-11-19 NOTE — Progress Notes (Signed)
Labs for ca125,cbcd,cmp

## 2013-11-19 NOTE — Patient Instructions (Signed)
[facility] Discharge Instructions  RECOMMENDATIONS MADE BY THE CONSULTANT AND ANY TEST RESULTS WILL BE SENT TO YOUR REFERRING PHYSICIAN.  We will see you in 4 weeks for repeat labs and a doctor's visit.  Use the heating pad to your stomach as needed, ibuprofen every 4 hours while awake, make sure you take with some food.  Take your additional medication for pain as needed.   Start Reglan -4 times a day.  That will help with your nausea. Call [facility] --check with Dr. Denman George about getting your laparoscopy performed.   Please call for any questions or concerns.   Thank you for choosing [facility] to provide your oncology and hematology care.  To afford each patient quality time with our providers, please arrive at least 15 minutes before your scheduled appointment time.  With your help, our goal is to use those 15 minutes to complete the necessary work-up to ensure our physicians have the information they need to help with your evaluation and healthcare recommendations.    Effective January 1st, 2014, we ask that you re-schedule your appointment with our physicians should you arrive 10 or more minutes late for your appointment.  We strive to give you quality time with our providers, and arriving late affects you and other patients whose appointments are after yours.    Again, thank you for choosing [facility].  Our hope is that these requests will decrease the amount of time that you wait before being seen by our physicians.       _____________________________________________________________  Should you have questions after your visit to [facility], please contact our office at [phone] between the hours of 8:30 a.m. and 4:30 p.m.  Voicemails left after 4:30 p.m. will not be returned until the following business day.  For prescription refill requests, have your pharmacy contact our office with your prescription  refill request.    _______________________________________________________________  We hope that we have given you very good care.  You may receive a patient satisfaction survey in the mail, please complete it and return it as soon as possible.  We value your feedback!  _______________________________________________________________  Have you asked about our STAR program?  STAR stands for Survivorship Training and Rehabilitation, and this is a nationally recognized cancer care program that focuses on survivorship and rehabilitation.  Cancer and cancer treatments may cause problems, such as, pain, making you feel tired and keeping you from doing the things that you need or want to do. Cancer rehabilitation can help. Our goal is to reduce these troubling effects and help you have the best quality of life possible.  You may receive a survey from a nurse that asks questions about your current state of health.  Based on the survey results, all eligible patients will be referred to the [facility] program for an evaluation so we can better serve you!  A frequently asked questions sheet is available upon request.

## 2013-11-19 NOTE — Progress Notes (Signed)
[facility] Clinical Social Work  Clinical Social Work was referred by patient's daughter for assessment of psychosocial needs due to financial concerns.  Clinical Social Worker met with daughter along with Financial Counselor to offer support and assess for needs. Pt appears to have issues making her out of pocket amounts for her medical care. CSW provided Cancer Care info, but explained poverty guidelines. CSW does not know of assistance for medical bills for ovarian cancer, but will search for resources.   Clinical Social Work interventions: Resource education  Loren Racer, [place] Tuesdays 8:30-1pm Wednesdays 8:30-12pm  [phone]

## 2013-11-19 NOTE — Progress Notes (Signed)
Rita Humphrey presented for Portacath access and flush.  .  Portacath located left chest wall accessed with  H 20 needle.  Sluggish blood return noted Portacath flushed with 78ml NS and 500U/76ml Heparin and needle removed intact.  Procedure tolerated well and without incident.   Flu shot given to left deltoid. Pt tolerated well.

## 2013-11-26 ENCOUNTER — Other Ambulatory Visit: Payer: Self-pay | Admitting: *Deleted

## 2013-11-26 ENCOUNTER — Telehealth (HOSPITAL_COMMUNITY): Payer: Self-pay | Admitting: Emergency Medicine

## 2013-11-26 ENCOUNTER — Telehealth: Payer: Self-pay | Admitting: Gynecologic Oncology

## 2013-11-26 ENCOUNTER — Telehealth: Payer: Self-pay | Admitting: *Deleted

## 2013-11-26 DIAGNOSIS — C569 Malignant neoplasm of unspecified ovary: Secondary | ICD-10-CM

## 2013-11-26 NOTE — Telephone Encounter (Signed)
Call from Dr. Alycia Rossetti, VO for US Pelvis and be seen by MD on Friday to review results. Called Carleen, pt daughter, notified appts. No further concerns at this time.

## 2013-11-26 NOTE — Telephone Encounter (Signed)
Telephone encounter.

## 2013-11-26 NOTE — Telephone Encounter (Signed)
TC to patient's daughter Carleen. Her mother is having partial SBO symptoms again like when she was admitted for bowel rest a few weeks ago. They do not want her to have to go through a surgical procedure at this time. I d/w them the rationale for tumor testing. We discussed that ovarian cancer does change over time and that the results from her initial testing may not longer hold true. They understand that and are willing to proceed with treatment guided by the initial testing rather than putting her through a surgical procedure and the risks associated with that. I d/w them that we can do the testing off of ascites fluid. She has not had ascites recently. She will come to clinic on Friday with an ultrasound. She will see Dr. Denman George. IF she has ascites, we will tap and send for molecular/chemoresistance testing. If she does NOT have ascites, they would like to proceed with chemotherapy based on her prior testing. This is documented in my prior telephone call. I d/w her that her mother's cancer is not curative and that we need to strike a balance between quality and quantity of life. They are in agreement with this.

## 2013-11-28 ENCOUNTER — Encounter (HOSPITAL_COMMUNITY): Payer: Self-pay | Admitting: Emergency Medicine

## 2013-11-28 ENCOUNTER — Ambulatory Visit (HOSPITAL_COMMUNITY)
Admission: RE | Admit: 2013-11-28 | Discharge: 2013-11-28 | Disposition: A | Discharge status: Home / Self Care | Payer: Medicare HMO | Source: Ambulatory Visit | Attending: Gynecologic Oncology | Admitting: Gynecologic Oncology

## 2013-11-28 ENCOUNTER — Ambulatory Visit (HOSPITAL_BASED_OUTPATIENT_CLINIC_OR_DEPARTMENT_OTHER): Payer: Medicare HMO | Admitting: Gynecologic Oncology

## 2013-11-28 ENCOUNTER — Inpatient Hospital Stay (HOSPITAL_COMMUNITY): Payer: Medicare HMO

## 2013-11-28 ENCOUNTER — Inpatient Hospital Stay (HOSPITAL_COMMUNITY)
Admission: EM | Admit: 2013-11-28 | Discharge: 2013-12-03 | DRG: 388 | Disposition: A | Discharge status: Hospice | Payer: Medicare HMO | Attending: Internal Medicine | Admitting: Internal Medicine

## 2013-11-28 ENCOUNTER — Other Ambulatory Visit: Payer: Self-pay | Admitting: Gynecologic Oncology

## 2013-11-28 ENCOUNTER — Emergency Department (HOSPITAL_COMMUNITY): Payer: Medicare HMO

## 2013-11-28 ENCOUNTER — Encounter: Payer: Self-pay | Admitting: Gynecologic Oncology

## 2013-11-28 VITALS — BP 130/78 | HR 98 | Temp 98.4°F | Resp 16 | Ht 63.0 in | Wt 186.7 lb

## 2013-11-28 DIAGNOSIS — C786 Secondary malignant neoplasm of retroperitoneum and peritoneum: Secondary | ICD-10-CM | POA: Diagnosis present

## 2013-11-28 DIAGNOSIS — Z86718 Personal history of other venous thrombosis and embolism: Secondary | ICD-10-CM

## 2013-11-28 DIAGNOSIS — C569 Malignant neoplasm of unspecified ovary: Secondary | ICD-10-CM

## 2013-11-28 DIAGNOSIS — H9190 Unspecified hearing loss, unspecified ear: Secondary | ICD-10-CM | POA: Diagnosis present

## 2013-11-28 DIAGNOSIS — K5669 Other intestinal obstruction: Secondary | ICD-10-CM | POA: Diagnosis present

## 2013-11-28 DIAGNOSIS — Z6831 Body mass index (BMI) 31.0-31.9, adult: Secondary | ICD-10-CM

## 2013-11-28 DIAGNOSIS — IMO0002 Reserved for concepts with insufficient information to code with codable children: Secondary | ICD-10-CM | POA: Diagnosis present

## 2013-11-28 DIAGNOSIS — M545 Low back pain, unspecified: Secondary | ICD-10-CM

## 2013-11-28 DIAGNOSIS — G620 Drug-induced polyneuropathy: Secondary | ICD-10-CM | POA: Diagnosis present

## 2013-11-28 DIAGNOSIS — Z515 Encounter for palliative care: Secondary | ICD-10-CM

## 2013-11-28 DIAGNOSIS — R1310 Dysphagia, unspecified: Secondary | ICD-10-CM | POA: Diagnosis present

## 2013-11-28 DIAGNOSIS — Z853 Personal history of malignant neoplasm of breast: Secondary | ICD-10-CM | POA: Diagnosis not present

## 2013-11-28 DIAGNOSIS — T451X5A Adverse effect of antineoplastic and immunosuppressive drugs, initial encounter: Secondary | ICD-10-CM | POA: Diagnosis present

## 2013-11-28 DIAGNOSIS — Z66 Do not resuscitate: Secondary | ICD-10-CM | POA: Diagnosis present

## 2013-11-28 DIAGNOSIS — K219 Gastro-esophageal reflux disease without esophagitis: Secondary | ICD-10-CM | POA: Diagnosis present

## 2013-11-28 DIAGNOSIS — E785 Hyperlipidemia, unspecified: Secondary | ICD-10-CM | POA: Diagnosis present

## 2013-11-28 DIAGNOSIS — J45909 Unspecified asthma, uncomplicated: Secondary | ICD-10-CM | POA: Diagnosis present

## 2013-11-28 DIAGNOSIS — R1084 Generalized abdominal pain: Secondary | ICD-10-CM | POA: Diagnosis present

## 2013-11-28 DIAGNOSIS — E43 Unspecified severe protein-calorie malnutrition: Secondary | ICD-10-CM | POA: Insufficient documentation

## 2013-11-28 DIAGNOSIS — R112 Nausea with vomiting, unspecified: Secondary | ICD-10-CM | POA: Diagnosis present

## 2013-11-28 DIAGNOSIS — G622 Polyneuropathy due to other toxic agents: Secondary | ICD-10-CM

## 2013-11-28 DIAGNOSIS — Z79899 Other long term (current) drug therapy: Secondary | ICD-10-CM | POA: Diagnosis not present

## 2013-11-28 DIAGNOSIS — K56609 Unspecified intestinal obstruction, unspecified as to partial versus complete obstruction: Secondary | ICD-10-CM

## 2013-11-28 DIAGNOSIS — Z803 Family history of malignant neoplasm of breast: Secondary | ICD-10-CM

## 2013-11-28 DIAGNOSIS — I10 Essential (primary) hypertension: Secondary | ICD-10-CM | POA: Diagnosis present

## 2013-11-28 DIAGNOSIS — R1114 Bilious vomiting: Secondary | ICD-10-CM

## 2013-11-28 DIAGNOSIS — D72829 Elevated white blood cell count, unspecified: Secondary | ICD-10-CM

## 2013-11-28 LAB — URINALYSIS, ROUTINE W REFLEX MICROSCOPIC
GLUCOSE, UA: NEGATIVE mg/dL
Hgb urine dipstick: NEGATIVE
Ketones, ur: NEGATIVE mg/dL
Nitrite: NEGATIVE
Protein, ur: 30 mg/dL — AB
SPECIFIC GRAVITY, URINE: 1.03 (ref 1.005–1.030)
Urobilinogen, UA: 1 mg/dL (ref 0.0–1.0)
pH: 5 (ref 5.0–8.0)

## 2013-11-28 LAB — CBC WITH DIFFERENTIAL/PLATELET
BASOS PCT: 0 % (ref 0–1)
Basophils Absolute: 0 10*3/uL (ref 0.0–0.1)
Eosinophils Absolute: 0.2 10*3/uL (ref 0.0–0.7)
Eosinophils Relative: 2 % (ref 0–5)
HCT: 46.2 % — ABNORMAL HIGH (ref 36.0–46.0)
HEMOGLOBIN: 14.8 g/dL (ref 12.0–15.0)
Lymphocytes Relative: 21 % (ref 12–46)
Lymphs Abs: 2.3 10*3/uL (ref 0.7–4.0)
MCH: 27.8 pg (ref 26.0–34.0)
MCHC: 32 g/dL (ref 30.0–36.0)
MCV: 86.7 fL (ref 78.0–100.0)
MONOS PCT: 9 % (ref 3–12)
Monocytes Absolute: 1 10*3/uL (ref 0.1–1.0)
NEUTROS ABS: 7.2 10*3/uL (ref 1.7–7.7)
NEUTROS PCT: 68 % (ref 43–77)
Platelets: 360 10*3/uL (ref 150–400)
RBC: 5.33 MIL/uL — ABNORMAL HIGH (ref 3.87–5.11)
RDW: 14.9 % (ref 11.5–15.5)
WBC: 10.6 10*3/uL — AB (ref 4.0–10.5)

## 2013-11-28 LAB — COMPREHENSIVE METABOLIC PANEL
ALBUMIN: 3.8 g/dL (ref 3.5–5.2)
ALK PHOS: 95 U/L (ref 39–117)
ALT: 28 U/L (ref 0–35)
AST: 21 U/L (ref 0–37)
Anion gap: 14 (ref 5–15)
BUN: 21 mg/dL (ref 6–23)
CHLORIDE: 95 meq/L — AB (ref 96–112)
CO2: 30 mEq/L (ref 19–32)
Calcium: 9.8 mg/dL (ref 8.4–10.5)
Creatinine, Ser: 0.99 mg/dL (ref 0.50–1.10)
GFR calc Af Amer: 64 mL/min — ABNORMAL LOW (ref >90)
GFR calc non Af Amer: 55 mL/min — ABNORMAL LOW (ref >90)
Glucose, Bld: 121 mg/dL — ABNORMAL HIGH (ref 70–99)
POTASSIUM: 3.8 meq/L (ref 3.7–5.3)
Sodium: 139 mEq/L (ref 137–147)
Total Bilirubin: 0.7 mg/dL (ref 0.3–1.2)
Total Protein: 7.6 g/dL (ref 6.0–8.3)

## 2013-11-28 LAB — I-STAT CG4 LACTIC ACID, ED: Lactic Acid, Venous: 1.09 mmol/L (ref 0.5–2.2)

## 2013-11-28 LAB — URINE MICROSCOPIC-ADD ON

## 2013-11-28 LAB — LIPASE, BLOOD: Lipase: 13 U/L (ref 11–59)

## 2013-11-28 MED ORDER — LACTATED RINGERS IV BOLUS (SEPSIS): 1000.0000 mL | Freq: Three times a day (TID) | INTRAVENOUS | Status: AC | PRN

## 2013-11-28 MED ORDER — LIDOCAINE-PRILOCAINE 2.5-2.5 % EX CREA
TOPICAL_CREAM | CUTANEOUS | Status: DC | PRN
  Administered 2013-11-28: 18:00:00 via TOPICAL
  Filled 2013-11-28: qty 5

## 2013-11-28 MED ORDER — MENTHOL 3 MG MT LOZG
1.0000 | LOZENGE | OROMUCOSAL | Status: DC | PRN
  Filled 2013-11-28: qty 9

## 2013-11-28 MED ORDER — ALUM & MAG HYDROXIDE-SIMETH 200-200-20 MG/5ML PO SUSP
30.0000 mL | Freq: Four times a day (QID) | ORAL | Status: DC | PRN
Start: 2013-11-28 — End: 2013-12-03

## 2013-11-28 MED ORDER — LIP MEDEX EX OINT
1.0000 "application " | TOPICAL_OINTMENT | Freq: Two times a day (BID) | CUTANEOUS | Status: DC
  Administered 2013-11-28 – 2013-12-03 (×10): 1 via TOPICAL
  Filled 2013-11-28 (×3): qty 7

## 2013-11-28 MED ORDER — METOPROLOL TARTRATE 1 MG/ML IV SOLN
5.0000 mg | Freq: Four times a day (QID) | INTRAVENOUS | Status: DC | PRN
  Filled 2013-11-28: qty 5

## 2013-11-28 MED ORDER — FENTANYL CITRATE 0.05 MG/ML IJ SOLN
100.0000 ug | Freq: Once | INTRAMUSCULAR | Status: AC
  Administered 2013-11-28: 100 ug via INTRAVENOUS
  Filled 2013-11-28: qty 2

## 2013-11-28 MED ORDER — ONDANSETRON HCL 4 MG/2ML IJ SOLN
4.0000 mg | Freq: Four times a day (QID) | INTRAMUSCULAR | Status: DC | PRN
  Administered 2013-12-01 – 2013-12-03 (×2): 4 mg via INTRAVENOUS
  Filled 2013-11-28 (×2): qty 2

## 2013-11-28 MED ORDER — PHENOL 1.4 % MT LIQD
2.0000 | OROMUCOSAL | Status: DC | PRN
  Filled 2013-11-28: qty 177

## 2013-11-28 MED ORDER — MAGIC MOUTHWASH
15.0000 mL | Freq: Four times a day (QID) | ORAL | Status: DC | PRN
  Filled 2013-11-28 (×2): qty 15

## 2013-11-28 MED ORDER — DIPHENHYDRAMINE HCL 50 MG/ML IJ SOLN
12.5000 mg | Freq: Four times a day (QID) | INTRAMUSCULAR | Status: DC | PRN
  Administered 2013-12-01 (×2): 12.5 mg via INTRAVENOUS
  Filled 2013-11-28 (×2): qty 1

## 2013-11-28 MED ORDER — ONDANSETRON HCL 4 MG/2ML IJ SOLN
4.0000 mg | Freq: Four times a day (QID) | INTRAMUSCULAR | Status: DC | PRN
Start: 2013-11-28 — End: 2013-11-28

## 2013-11-28 MED ORDER — PHENOL 1.4 % MT LIQD
1.0000 | OROMUCOSAL | Status: DC | PRN
  Administered 2013-11-29: 1 via OROMUCOSAL
  Filled 2013-11-28 (×2): qty 177

## 2013-11-28 MED ORDER — ONDANSETRON HCL 4 MG/2ML IJ SOLN
4.0000 mg | Freq: Once | INTRAMUSCULAR | Status: AC
  Administered 2013-11-28: 4 mg via INTRAVENOUS
  Filled 2013-11-28: qty 2

## 2013-11-28 MED ORDER — ONDANSETRON HCL 4 MG/2ML IJ SOLN
4.0000 mg | Freq: Four times a day (QID) | INTRAMUSCULAR | Status: DC
  Administered 2013-11-28 – 2013-12-03 (×19): 4 mg via INTRAVENOUS
  Filled 2013-11-28 (×19): qty 2

## 2013-11-28 MED ORDER — GUAIFENESIN-DM 100-10 MG/5ML PO SYRP: 5.0000 mL | ORAL_SOLUTION | ORAL | Status: DC | PRN

## 2013-11-28 MED ORDER — IOHEXOL 300 MG/ML  SOLN
100.0000 mL | Freq: Once | INTRAMUSCULAR | Status: AC | PRN
  Administered 2013-11-28: 100 mL via INTRAVENOUS

## 2013-11-28 MED ORDER — ACETAMINOPHEN 650 MG RE SUPP: 650.0000 mg | Freq: Four times a day (QID) | RECTAL | Status: DC | PRN

## 2013-11-28 MED ORDER — MENTHOL 3 MG MT LOZG
1.0000 | LOZENGE | OROMUCOSAL | Status: DC | PRN
  Administered 2013-11-30: 3 mg via ORAL
  Filled 2013-11-28 (×2): qty 9

## 2013-11-28 MED ORDER — ONDANSETRON 8 MG/NS 50 ML IVPB
8.0000 mg | Freq: Four times a day (QID) | INTRAVENOUS | Status: DC | PRN
Start: 2013-11-28 — End: 2013-12-03
  Filled 2013-11-28: qty 8

## 2013-11-28 MED ORDER — HYDRALAZINE HCL 20 MG/ML IJ SOLN: 10.0000 mg | Freq: Four times a day (QID) | INTRAMUSCULAR | Status: DC | PRN

## 2013-11-28 MED ORDER — HEPARIN SODIUM (PORCINE) 5000 UNIT/ML IJ SOLN
5000.0000 [IU] | Freq: Three times a day (TID) | INTRAMUSCULAR | Status: DC
  Administered 2013-11-28 – 2013-12-03 (×11): 5000 [IU] via SUBCUTANEOUS
  Filled 2013-11-28 (×17): qty 1

## 2013-11-28 MED ORDER — FENTANYL CITRATE 0.05 MG/ML IJ SOLN
50.0000 ug | INTRAMUSCULAR | Status: DC | PRN
  Administered 2013-11-28 – 2013-12-03 (×16): 50 ug via INTRAVENOUS
  Filled 2013-11-28 (×16): qty 2

## 2013-11-28 MED ORDER — POLYETHYLENE GLYCOL 3350 17 G PO PACK
17.0000 g | PACK | Freq: Every day | ORAL | Status: DC | PRN
  Filled 2013-11-28: qty 1

## 2013-11-28 MED ORDER — MORPHINE SULFATE 2 MG/ML IJ SOLN: 2.0000 mg | INTRAMUSCULAR | Status: DC | PRN

## 2013-11-28 MED ORDER — ONDANSETRON HCL 4 MG PO TABS: 4.0000 mg | ORAL_TABLET | Freq: Four times a day (QID) | ORAL | Status: DC | PRN

## 2013-11-28 MED ORDER — SODIUM CHLORIDE 0.9 % IV BOLUS (SEPSIS)
1000.0000 mL | Freq: Once | INTRAVENOUS | Status: AC
  Administered 2013-11-28: 1000 mL via INTRAVENOUS

## 2013-11-28 MED ORDER — IOHEXOL 300 MG/ML  SOLN
50.0000 mL | Freq: Once | INTRAMUSCULAR | Status: AC | PRN
  Administered 2013-11-28: 50 mL via ORAL

## 2013-11-28 MED ORDER — KCL IN DEXTROSE-NACL 20-5-0.45 MEQ/L-%-% IV SOLN
INTRAVENOUS | Status: AC
  Administered 2013-11-28 – 2013-11-29 (×2): via INTRAVENOUS
  Administered 2013-11-29: 1000 mL via INTRAVENOUS
  Administered 2013-11-30 (×2): via INTRAVENOUS
  Filled 2013-11-28 (×4): qty 1000

## 2013-11-28 MED ORDER — PROMETHAZINE HCL 25 MG/ML IJ SOLN: 6.2500 mg | INTRAMUSCULAR | Status: DC | PRN

## 2013-11-28 MED ORDER — BISACODYL 10 MG RE SUPP
10.0000 mg | Freq: Every day | RECTAL | Status: DC
  Administered 2013-11-28 – 2013-12-03 (×5): 10 mg via RECTAL
  Filled 2013-11-28 (×5): qty 1

## 2013-11-28 NOTE — ED Notes (Signed)
Pt transported to radiology via stretcher with tech.  

## 2013-11-28 NOTE — Consult Note (Signed)
Reason for Consult:  SBO Referring Physician: Irena Cords PCP:  Delphina Cahill, MD Oncology:  Janie Morning  Rita Humphrey is an 73 y.o. female.  HPI: Progressive ovarian cancer with metatastis  73 y/o who went for follow up with her Oncologist today who has been having chemotherapy in the past.  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  Her tumor is being followed .  She has a PET scan from 10/03/13 that shows new peritoneal spread and thickening along the pericolic gutters.  She also had a new right tonsillar site with high activity.  She was admitted 10/30/13-10/31/13 with abdominal pain nausea, vomiting and dehydration that was felt to be due to constipation. She was treated and sent home.  Since that time she has continued to have constipation, nausea, occasional vomiting.  Ongoing abdominal pain.  She has oxycodone for this but does not like to use it because she has more constipation with it.  She reports using allot of ibuprofen to handle the pain and avoid constipation. On follow up visit today she reports 48 hours of ongoing nausea and vomiting. She has lost 10 pounds over the last month per her daughters, she reports no appetite, everything taste bitter. She has chronic abdominal pain mostly in the right and left lower abdomen, but some in the LUQ.  She has chronic constipation even before surgery.  Worse now with pain and limited oxycodone use.  Work up in the ED shows she is afebrile and hemodynamically stable.  WBC up to 10.6.  Ct of the abdomen shows: multiple dilated loops SB, and transtion zone in the deep pelvis.  considerable amount of inflammatory change and thickening of the mesenteric surrounding the transition zone. 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 A short segment of distal terminal ileum is noted prior to the ascending colon.  Small amount of ascites is seen.  We are ask to see.   Past Medical History  Diagnosis Date  Metastatic Ovarian cancer   Omental metastasis   Breast cancer    s/p bilateral mastectomy [id number]     History of kidney stones   Hypertension  Hyperlipemia  Thyroid disease  Asthma     Arthritis   arthritis in neck  Peripheral neuropathy, secondary to chemotherapy     Peripheral vascular disease 2013   hx of DVT right leg     Deafness in left ear              Past Surgical History  Procedure Laterality Date  Laparotomy Bilateral 06/05/2012   Procedure: EXPLORATORY LAPAROTOMY WITH PELVIC TUMOR BIOPSY;  Surgeon: Imagene Gurney A. Alycia Rossetti, MD;  Location: [facility];  Service: Gynecology;  Laterality: Bilateral;      Laparoscopy N/A 10/30/2012   Procedure: LAPAROSCOPY DIAGNOSTIC, EXPLORATORY LAPAROTOMY, BILATERAL SALPINGO-OOPHORECTOMY, TUMOR DEBULKING;  Surgeon: Imagene Gurney A. Alycia Rossetti, MD;  Location: [facility];  Service: Gynecology;  Laterality: N/A;      Portacath placement Left 06/25/2012   Procedure: INSERTION PORT-A-CATH;  Surgeon: Jamesetta So, MD;  Location: [facility];  Service: General;  Laterality: Left;      Mastectomy     bilateral       Abdominal hysterectomy  1980  Vascular surgery     veins stripped from both legs      Thyroid lobectomy    Foot surgery     2 spurs taken  out of foot  Varicose vein surgery    Bladder tack surgery     Tonsillectomy          Family History  Problem Relation Age of Onset  . Cancer Mother 33    throat cancer, tobacco exposure  . Hypertension Father   . Heart attack Brother     5 brothers  . Hypertension Sister   . Cancer Other     niece through brother with breast cancer at unknown age    Social History:  reports that she has never smoked. She has never used smokeless tobacco. She reports that she does not drink alcohol or use illicit drugs.  Allergies:  Allergies  Allergen Reactions  . Gabapentin Diarrhea  . Penicillins Anaphylaxis and  Nausea And Vomiting  . Carboplatin   . Morphine And Related Nausea And Vomiting    Medications:  Prior to Admission:  (Not in a hospital admission) Scheduled: Continuous: PRN:   Prior to Admission medications   Medication Sig Start Date End Date Taking? Authorizing Provider  amLODipine (NORVASC) 10 MG tablet Take 10 mg by mouth daily before breakfast.    Yes Historical Provider, MD  docusate sodium (COLACE) 100 MG capsule Take 100 mg by mouth 2 (two) times daily.   Yes Historical Provider, MD  DULoxetine (CYMBALTA) 30 MG capsule Take 60 mg by mouth daily.   Yes Historical Provider, MD  fluticasone (FLONASE) 50 MCG/ACT nasal spray Place 2 sprays into both nostrils daily.   Yes Historical Provider, MD  ondansetron (ZOFRAN-ODT) 8 MG disintegrating tablet Take 8 mg by mouth every 8 (eight) hours as needed for nausea or vomiting.   Yes Historical Provider, MD  oxyCODONE-acetaminophen (PERCOCET/ROXICET) 5-325 MG per tablet Take 1 tablet by mouth every 4 (four) hours as needed for severe pain.   Yes Historical Provider, MD  pantoprazole (PROTONIX) 40 MG tablet Take 40 mg by mouth daily.   Yes Historical Provider, MD  polyethylene glycol (MIRALAX / GLYCOLAX) packet Take 17 g by mouth 2 (two) times daily.   Yes Historical Provider, MD  potassium chloride (K-DUR,KLOR-CON) 10 MEQ tablet Take 10 mEq by mouth daily.   Yes Historical Provider, MD  prochlorperazine (COMPAZINE) 10 MG tablet Take 10 mg by mouth every 6 (six) hours as needed for nausea or vomiting.   Yes Historical Provider, MD    Anti-infectives   None      Results for orders placed during the hospital encounter of 11/28/13 (from the past 48 hour(s))  CBC WITH DIFFERENTIAL     Status: Abnormal   Collection Time    11/28/13 12:01 PM      Result Value Ref Range   WBC 10.6 (*) 4.0 - 10.5 K/uL   RBC 5.33 (*) 3.87 - 5.11 MIL/uL   Hemoglobin 14.8  12.0 - 15.0 g/dL   HCT 46.2 (*) 36.0 - 46.0 %   MCV 86.7  78.0 - 100.0 fL   MCH 27.8   26.0 - 34.0 pg   MCHC 32.0  30.0 - 36.0 g/dL   RDW 14.9  11.5 - 15.5 %   Platelets 360  150 - 400 K/uL   Neutrophils Relative % 68  43 - 77 %   Neutro Abs 7.2  1.7 - 7.7 K/uL   Lymphocytes Relative 21  12 - 46 %   Lymphs Abs 2.3  0.7 - 4.0 K/uL   Monocytes Relative 9  3 - 12 %   Monocytes Absolute 1.0  0.1 -  1.0 K/uL   Eosinophils Relative 2  0 - 5 %   Eosinophils Absolute 0.2  0.0 - 0.7 K/uL   Basophils Relative 0  0 - 1 %   Basophils Absolute 0.0  0.0 - 0.1 K/uL  COMPREHENSIVE METABOLIC PANEL     Status: Abnormal   Collection Time    11/28/13 12:01 PM      Result Value Ref Range   Sodium 139  137 - 147 mEq/L   Potassium 3.8  3.7 - 5.3 mEq/L   Chloride 95 (*) 96 - 112 mEq/L   CO2 30  19 - 32 mEq/L   Glucose, Bld 121 (*) 70 - 99 mg/dL   BUN 21  6 - 23 mg/dL   Creatinine, Ser 0.99  0.50 - 1.10 mg/dL   Calcium 9.8  8.4 - 10.5 mg/dL   Total Protein 7.6  6.0 - 8.3 g/dL   Albumin 3.8  3.5 - 5.2 g/dL   AST 21  0 - 37 U/L   ALT 28  0 - 35 U/L   Alkaline Phosphatase 95  39 - 117 U/L   Total Bilirubin 0.7  0.3 - 1.2 mg/dL   GFR calc non Af Amer 55 (*) >90 mL/min   GFR calc Af Amer 64 (*) >90 mL/min   Comment: (NOTE)     The eGFR has been calculated using the CKD EPI equation.     This calculation has not been validated in all clinical situations.     eGFR's persistently <90 mL/min signify possible Chronic Kidney     Disease.   Anion gap 14  5 - 15  LIPASE, BLOOD     Status: None   Collection Time    11/28/13 12:01 PM      Result Value Ref Range   Lipase 13  11 - 59 U/L  I-STAT CG4 LACTIC ACID, ED     Status: None   Collection Time    11/28/13 12:18 PM      Result Value Ref Range   Lactic Acid, Venous 1.09  0.5 - 2.2 mmol/L  URINALYSIS, ROUTINE W REFLEX MICROSCOPIC     Status: Abnormal   Collection Time    11/28/13 12:36 PM      Result Value Ref Range   Color, Urine AMBER (*) YELLOW   Comment: BIOCHEMICALS MAY BE AFFECTED BY COLOR   APPearance CLOUDY (*) CLEAR    Specific Gravity, Urine 1.030  1.005 - 1.030   pH 5.0  5.0 - 8.0   Glucose, UA NEGATIVE  NEGATIVE mg/dL   Hgb urine dipstick NEGATIVE  NEGATIVE   Bilirubin Urine SMALL (*) NEGATIVE   Ketones, ur NEGATIVE  NEGATIVE mg/dL   Protein, ur 30 (*) NEGATIVE mg/dL   Urobilinogen, UA 1.0  0.0 - 1.0 mg/dL   Nitrite NEGATIVE  NEGATIVE   Leukocytes, UA TRACE (*) NEGATIVE  URINE MICROSCOPIC-ADD ON     Status: None   Collection Time    11/28/13 12:36 PM      Result Value Ref Range   WBC, UA 0-2  <3 WBC/hpf   Urine-Other MUCOUS PRESENT      US Transvaginal Non-ob  11/28/2013   CLINICAL DATA:  History of ovarian cancer. Pelvic pain and pressure. Prior hysterectomy and bilateral oophorectomy.  EXAM: TRANSABDOMINAL AND TRANSVAGINAL ULTRASOUND OF PELVIS  TECHNIQUE: Both transabdominal and transvaginal ultrasound examinations of the pelvis were performed. Transabdominal technique was performed for global imaging of the pelvis including uterus, ovaries, adnexal regions,  and pelvic cul-de-sac. It was necessary to proceed with endovaginal exam following the transabdominal exam to visualize the adnexal regions.  COMPARISON:  07/04/2013  FINDINGS: Uterus  Measurements: The uterus is surgically absent. The vaginal cuff has a normal appearance.  Endometrium  Thickness: The uterus is surgically absent.  Right ovary  Measurements: Status post oophorectomy. No adnexal mass  Left ovary  Measurements: Status post oophorectomy. No adnexal mass  Other findings  A small amount of free pelvic fluid is identified. The patient thinks she is scheduled for paracentesis later today.  IMPRESSION: 1. No evidence for pelvic mass. 2. Status post hysterectomy and bilateral salpingo-oophorectomy. 3. Small amount of free pelvic fluid.   Electronically Signed   By: Shon Hale M.D.   On: 11/28/2013 09:01   US Pelvis Complete  11/28/2013   CLINICAL DATA:  History of ovarian cancer. Pelvic pain and pressure. Prior hysterectomy and bilateral  oophorectomy.  EXAM: TRANSABDOMINAL AND TRANSVAGINAL ULTRASOUND OF PELVIS  TECHNIQUE: Both transabdominal and transvaginal ultrasound examinations of the pelvis were performed. Transabdominal technique was performed for global imaging of the pelvis including uterus, ovaries, adnexal regions, and pelvic cul-de-sac. It was necessary to proceed with endovaginal exam following the transabdominal exam to visualize the adnexal regions.  COMPARISON:  07/04/2013  FINDINGS: Uterus  Measurements: The uterus is surgically absent. The vaginal cuff has a normal appearance.  Endometrium  Thickness: The uterus is surgically absent.  Right ovary  Measurements: Status post oophorectomy. No adnexal mass  Left ovary  Measurements: Status post oophorectomy. No adnexal mass  Other findings  A small amount of free pelvic fluid is identified. The patient thinks she is scheduled for paracentesis later today.  IMPRESSION: 1. No evidence for pelvic mass. 2. Status post hysterectomy and bilateral salpingo-oophorectomy. 3. Small amount of free pelvic fluid.   Electronically Signed   By: Shon Hale M.D.   On: 11/28/2013 09:01   Ct Abdomen Pelvis W Contrast  11/28/2013   CLINICAL DATA:  Nausea and vomiting abdominal pain  EXAM: CT ABDOMEN AND PELVIS WITH CONTRAST  TECHNIQUE: Multidetector CT imaging of the abdomen and pelvis was performed using the standard protocol following bolus administration of intravenous contrast.  CONTRAST:  56m OMNIPAQUE IOHEXOL 300 MG/ML SOLN, 1045mOMNIPAQUE IOHEXOL 300 MG/ML SOLN  COMPARISON:  10/03/2013  FINDINGS: The lung bases are free of acute infiltrate or sizable effusion.  The liver is diffusely decreased in attenuation consistent with fatty infiltration. The gallbladder, spleen, adrenal glands and pancreas are within normal limits. The kidneys are well visualized bilaterally. 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 Parapelvic cysts are seen. No renal calculi or  urinary tract obstructive changes are noted.  Bladder is incompletely distended. There has been prior hysterectomy and oophorectomy. There are multiple dilated loops of small bowel identified with a transition zone deep within the pelvis in the distal ileum. There is considerable amount of inflammatory change and thickening of the mesenteric surrounding the transition zone. 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 A short segment of distal terminal ileum is noted prior to the ascending colon. Small amount of ascites is seen. No acute bony abnormality is noted.  IMPRESSION: Changes consistent with small bowel obstruction in the distal ileum just before the terminal ileum. A considerable amount of mesenteric thickening is noted surrounding this region. These changes were present on  the prior PET-CT.  Chronic changes as described above.   Electronically Signed   By: Inez Catalina M.D.   On: 11/28/2013 14:22    Review of Systems  Constitutional: Positive for chills and weight loss (10 POUND OVER THE LAST MONTH). Negative for fever.  HENT: Positive for hearing loss (DEAF LEFT EAR).   Eyes: Negative.   Respiratory: Positive for cough. Negative for hemoptysis, sputum production, shortness of breath and wheezing.   Cardiovascular: Positive for leg swelling. Negative for chest pain, palpitations, orthopnea, claudication and PND.  Gastrointestinal: Positive for nausea, vomiting, abdominal pain and constipation. Negative for diarrhea, blood in stool and melena.  Genitourinary: Positive for hematuria.  Skin: Negative.   Neurological: Negative for tremors, speech change, focal weakness, seizures and loss of consciousness.       SIGNIFICANT PERIPHERAL NEUROPATHY WITH CHEMO THERAPHY  Endo/Heme/Allergies: Negative.   Psychiatric/Behavioral: Negative for depression, suicidal ideas,  hallucinations, memory loss and substance abuse. The patient is nervous/anxious. The patient does not have insomnia.    Blood pressure 125/59, pulse 85, temperature 97.9 F (36.6 C), temperature source Oral, resp. rate 16, SpO2 92.00%. Physical Exam  Assessment/Plan: 1.  SBO 2.  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 Debulking and BSSO  Procedures,10/2012, followed by Chemotherapy. 3.  Hx of bilateral breast cancer with mastectomies 4.  Hx nephrolithiasis 5.  Hypertension 6.  Hyperlipidemia 7.  Thyroid disease 8.  Hx of DVT 9.  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 10.  Chronic low grade constipation  10.  Prior T&A with new activity right tonsillar site.  Plan:  Bowel rest, hydration, NG decompression.  We will follow with you. Bassheva Flury 11/28/2013, 3:17 PM

## 2013-11-28 NOTE — ED Notes (Signed)
[place], MD at bedside talking with pt and family.

## 2013-11-28 NOTE — ED Notes (Signed)
Pt sent from cancer center to rule out small bowel obstruction.  Pt c/o abd pain, n/v for 2 days.

## 2013-11-28 NOTE — Patient Instructions (Signed)
Proceed to the emergency room for evaluation to rule out small bowel obstruction per Dr. Leone Brand recommendations.

## 2013-11-28 NOTE — H&P (Signed)
Patient Demographics  Rita Humphrey, is a 73 y.o. female  MRN: [id number]   DOB - [birth year]  Admit Date - 11/28/2013  Outpatient Primary MD for the patient is Delphina Cahill, MD   With History of -  Past Medical History  Diagnosis Date  . Hypertension   . Hyperlipemia   . Thyroid disease   . Asthma   . Adnexal mass 05/16/2012  . 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 Cancer     [place]  . Breast cancer     s/p bilateral mastectomy [id number]  . Ovarian cancer 05/17/2012  . 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 Peripheral vascular disease 2013    hx of DVT right leg  . History of kidney stones   . Anemia   . Platelets decreased april 2014     1 transfusion given before chemo  . Deafness in left ear   . Numbness in feet   . Numbness of fingers of both hands   . Dizziness     occasional  . History of breast cancer 12/07/2012      Past Surgical History  Procedure Laterality Date  . Thyroid lobectomy    . Foot surgery      2 spurs taken out of foot  . Varicose vein surgery    . Bladder tack surgery     . Tonsillectomy    . Mastectomy      bilateral   . Laparotomy Bilateral 06/05/2012    Procedure: EXPLORATORY LAPAROTOMY WITH PELVIC TUMOR BIOPSY;  Surgeon: Imagene Gurney A. Alycia Rossetti, MD;  Location: [facility];  Service: Gynecology;  Laterality: Bilateral;  . Abdominal hysterectomy  1980  . Vascular surgery      veins stripped from both legs  . Portacath placement Left 06/25/2012    Procedure: INSERTION PORT-A-CATH;  Surgeon: Jamesetta So, MD;  Location: [facility];  Service: General;  Laterality: Left;  . Laparoscopy N/A 10/30/2012    Procedure: LAPAROSCOPY DIAGNOSTIC, EXPLORATORY LAPAROTOMY, BILATERAL SALPINGO-OOPHORECTOMY, TUMOR DEBULKING;  Surgeon: Imagene Gurney A. Alycia Rossetti, MD;   Location: [facility];  Service: Gynecology;  Laterality: N/A;    in for   Chief Complaint  Patient presents with  . Nausea  . Emesis  . Abdominal Pain     HPI  Rita Humphrey  is a 73 y.o. female, with history of ovarian cancer with mesenteric metastasis who is under the care of Dr. Skeet Latch oncologist, failed first cycle of chemotherapy, admitted recently for SBO, HTN, Dyslipidemia, who started experiencing some abdominal pain which was generalized along with nausea vomiting, inability to pass flatus for the last 2 days, went to see her oncologist Dr. Skeet Latch who obtained an abdominal and pelvic ultrasound along with abdominal CT scan suggestive of SBO, she was then sent to [facility] long ER from where general surgery was consulted and I was called to admit the patient.   Patient currently denies  any fever chills. No headache. No chest pain, she does have some exertional shortness of breath, no focal weakness, not passing flatus or having BM for the last 36-48 hours, does have unintentional weight loss, no focal weakness, I discussed her case with her oncologist Dr. Skeet Latch in detail who told me that her long-term prognosis was poor in any intervention including PEG tube a surgical resection will be for palliative purposes only.    Review of Systems    In addition to the HPI above,   No Fever-chills, No Headache, No changes with Vision or hearing, No problems swallowing food or Liquids, No Chest pain, Cough or Shortness of Breath, Positive Abdominal pain along with o Nausea or Vommitting, no BMs not passing flatus in the last 36-48 hours No Blood in stool or Urine, No dysuria, No new skin rashes or bruises, No new joints pains-aches,  No new weakness, tingling, numbness in any extremity, No recent weight gain, No polyuria, polydypsia or polyphagia, No significant Mental Stressors.  A full 10 point Review of Systems was done, except as stated above, all other Review of Systems were  negative.   Social History History  Substance Use Topics  . Smoking status: Never Smoker   . Smokeless tobacco: Never Used  . Alcohol Use: No     Family History Family History  Problem Relation Age of Onset  . Cancer Mother 103    throat cancer, tobacco exposure  . Hypertension Father   . Heart attack Brother     5 brothers  . Hypertension Sister   . Cancer Other     niece through brother with breast cancer at unknown age      Prior to Admission medications   Medication Sig Start Date End Date Taking? Authorizing Provider  amLODipine (NORVASC) 10 MG tablet Take 10 mg by mouth daily before breakfast.    Yes Historical Provider, MD  docusate sodium (COLACE) 100 MG capsule Take 100 mg by mouth 2 (two) times daily.   Yes Historical Provider, MD  DULoxetine (CYMBALTA) 30 MG capsule Take 60 mg by mouth daily.   Yes Historical Provider, MD  fluticasone (FLONASE) 50 MCG/ACT nasal spray Place 2 sprays into both nostrils daily.   Yes Historical Provider, MD  ondansetron (ZOFRAN-ODT) 8 MG disintegrating tablet Take 8 mg by mouth every 8 (eight) hours as needed for nausea or vomiting.   Yes Historical Provider, MD  oxyCODONE-acetaminophen (PERCOCET/ROXICET) 5-325 MG per tablet Take 1 tablet by mouth every 4 (four) hours as needed for severe pain.   Yes Historical Provider, MD  pantoprazole (PROTONIX) 40 MG tablet Take 40 mg by mouth daily.   Yes Historical Provider, MD  polyethylene glycol (MIRALAX / GLYCOLAX) packet Take 17 g by mouth 2 (two) times daily.   Yes Historical Provider, MD  potassium chloride (K-DUR,KLOR-CON) 10 MEQ tablet Take 10 mEq by mouth daily.   Yes Historical Provider, MD  prochlorperazine (COMPAZINE) 10 MG tablet Take 10 mg by mouth every 6 (six) hours as needed for nausea or vomiting.   Yes Historical Provider, MD    Allergies  Allergen Reactions  . Gabapentin Diarrhea  . Penicillins Anaphylaxis and Nausea And Vomiting  . Carboplatin   . Morphine And Related  Nausea And Vomiting    Physical Exam  Vitals  Blood pressure 125/59, pulse 85, temperature 97.9 F (36.6 C), temperature source Oral, resp. rate 16, SpO2 92.00%.   1. General elderly obese white female lying in bed in NAD,  2. Normal affect and insight, Not Suicidal or Homicidal, Awake Alert, Oriented X 3.  3. No F.N deficits, ALL C.Nerves Intact, Strength 5/5 all 4 extremities, Sensation intact all 4 extremities, Plantars down going.  4. Ears and Eyes appear Normal, Conjunctivae clear, PERRLA. Moist Oral Mucosa.  5. Supple Neck, No JVD, No cervical lymphadenopathy appriciated, No Carotid Bruits.  6. Symmetrical Chest wall movement, Good air movement bilaterally, CTAB.  7. RRR, No Gallops, Rubs or Murmurs, No Parasternal Heave.  8. Positive Bowel Sounds, Abdomen Soft mildly distended, No tenderness, No organomegaly appriciated,No rebound -guarding or rigidity. NG tube was placed actively draining gastric contents  9.  No Cyanosis, Normal Skin Turgor, No Skin Rash or Bruise.  10. Good muscle tone,  joints appear normal , no effusions, Normal ROM.  11. No Palpable Lymph Nodes in Neck or Axillae     Data Review  CBC  Recent Labs Lab 11/28/13 1201  WBC 10.6*  HGB 14.8  HCT 46.2*  PLT 360  MCV 86.7  MCH 27.8  MCHC 32.0  RDW 14.9  LYMPHSABS 2.3  MONOABS 1.0  EOSABS 0.2  BASOSABS 0.0   ------------------------------------------------------------------------------------------------------------------  Chemistries   Recent Labs Lab 11/28/13 1201  NA 139  K 3.8  CL 95*  CO2 30  GLUCOSE 121*  BUN 21  CREATININE 0.99  CALCIUM 9.8  AST 21  ALT 28  ALKPHOS 95  BILITOT 0.7   ------------------------------------------------------------------------------------------------------------------ CrCl is unknown because both a height and weight (above a minimum accepted value) are required for this  calculation. ------------------------------------------------------------------------------------------------------------------ No results found for this basename: TSH, T4TOTAL, FREET3, T3FREE, THYROIDAB,  in the last 72 hours   Coagulation profile No results found for this basename: INR, PROTIME,  in the last 168 hours ------------------------------------------------------------------------------------------------------------------- No results found for this basename: DDIMER,  in the last 72 hours -------------------------------------------------------------------------------------------------------------------  Cardiac Enzymes No results found for this basename: CK, CKMB, TROPONINI, MYOGLOBIN,  in the last 168 hours ------------------------------------------------------------------------------------------------------------------ No components found with this basename: POCBNP,    ---------------------------------------------------------------------------------------------------------------  Urinalysis    Component Value Date/Time   COLORURINE AMBER* 11/28/2013 1236   APPEARANCEUR CLOUDY* 11/28/2013 1236   LABSPEC 1.030 11/28/2013 1236   PHURINE 5.0 11/28/2013 1236   GLUCOSEU NEGATIVE 11/28/2013 1236   [place] 11/28/2013 1236   BILIRUBINUR SMALL* 11/28/2013 [place] 11/28/2013 1236   PROTEINUR 30* 11/28/2013 1236   UROBILINOGEN 1.0 11/28/2013 1236   NITRITE NEGATIVE 11/28/2013 1236   LEUKOCYTESUR TRACE* 11/28/2013 1236    ----------------------------------------------------------------------------------------------------------------  Imaging results:   US Transvaginal Non-ob  11/28/2013   CLINICAL DATA:  History of ovarian cancer. Pelvic pain and pressure. Prior hysterectomy and bilateral oophorectomy.  EXAM: TRANSABDOMINAL AND TRANSVAGINAL ULTRASOUND OF PELVIS  TECHNIQUE: Both transabdominal and transvaginal ultrasound examinations of the pelvis were performed.  Transabdominal technique was performed for global imaging of the pelvis including uterus, ovaries, adnexal regions, and pelvic cul-de-sac. It was necessary to proceed with endovaginal exam following the transabdominal exam to visualize the adnexal regions.  COMPARISON:  07/04/2013  FINDINGS: Uterus  Measurements: The uterus is surgically absent. The vaginal cuff has a normal appearance.  Endometrium  Thickness: The uterus is surgically absent.  Right ovary  Measurements: Status post oophorectomy. No adnexal mass  Left ovary  Measurements: Status post oophorectomy. No adnexal mass  Other findings  A small amount of free pelvic fluid is identified. The patient thinks she is scheduled for paracentesis later today.  IMPRESSION: 1. No evidence for pelvic  mass. 2. Status post hysterectomy and bilateral salpingo-oophorectomy. 3. Small amount of free pelvic fluid.   Electronically Signed   By: Shon Hale M.D.   On: 11/28/2013 09:01   US Pelvis Complete  11/28/2013   CLINICAL DATA:  History of ovarian cancer. Pelvic pain and pressure. Prior hysterectomy and bilateral oophorectomy.  EXAM: TRANSABDOMINAL AND TRANSVAGINAL ULTRASOUND OF PELVIS  TECHNIQUE: Both transabdominal and transvaginal ultrasound examinations of the pelvis were performed. Transabdominal technique was performed for global imaging of the pelvis including uterus, ovaries, adnexal regions, and pelvic cul-de-sac. It was necessary to proceed with endovaginal exam following the transabdominal exam to visualize the adnexal regions.  COMPARISON:  07/04/2013  FINDINGS: Uterus  Measurements: The uterus is surgically absent. The vaginal cuff has a normal appearance.  Endometrium  Thickness: The uterus is surgically absent.  Right ovary  Measurements: Status post oophorectomy. No adnexal mass  Left ovary  Measurements: Status post oophorectomy. No adnexal mass  Other findings  A small amount of free pelvic fluid is identified. The patient thinks she is scheduled  for paracentesis later today.  IMPRESSION: 1. No evidence for pelvic mass. 2. Status post hysterectomy and bilateral salpingo-oophorectomy. 3. Small amount of free pelvic fluid.   Electronically Signed   By: Shon Hale M.D.   On: 11/28/2013 09:01   Ct Abdomen Pelvis W Contrast  11/28/2013   CLINICAL DATA:  Nausea and vomiting abdominal pain  EXAM: CT ABDOMEN AND PELVIS WITH CONTRAST  TECHNIQUE: Multidetector CT imaging of the abdomen and pelvis was performed using the standard protocol following bolus administration of intravenous contrast.  CONTRAST:  39mL OMNIPAQUE IOHEXOL 300 MG/ML SOLN, 136mL OMNIPAQUE IOHEXOL 300 MG/ML SOLN  COMPARISON:  10/03/2013  FINDINGS: The lung bases are free of acute infiltrate or sizable effusion.  The liver is diffusely decreased in attenuation consistent with fatty infiltration. The gallbladder, spleen, adrenal glands and pancreas are within normal limits. The kidneys are well visualized bilaterally. 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 Parapelvic cysts are seen. No renal calculi or urinary tract obstructive changes are noted.  Bladder is incompletely distended. There has been prior hysterectomy and oophorectomy. There are multiple dilated loops of small bowel identified with a transition zone deep within the pelvis in the distal ileum. There is considerable amount of inflammatory change and thickening of the mesenteric surrounding the transition zone. 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 A short segment of distal terminal ileum is noted prior to the ascending colon. Small amount of ascites is seen. No acute bony abnormality is noted.  IMPRESSION: Changes consistent with small bowel obstruction in the distal ileum just before the terminal ileum. A considerable amount of mesenteric thickening is  noted surrounding this region. These changes were present on the prior PET-CT.  Chronic changes as described above.   Electronically Signed   By: Inez Catalina M.D.   On: 11/28/2013 14:22         Assessment & Plan   1. 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 Skeet Latch, patient has failed her chemotherapy, per Dr. Skeet Latch her long-term prognosis is poor, general surgery has already seen the patient. At this time we will admit the patient to a MedSurg bed, n.p.o., NG tube for drainage, IV fluids, supportive care, if  she does not improve with this a reasonable option will be a palliative G-tube placement by IR. Patient and family agreeable for it. She is agreeable for supportive care, input by palliative care and to become DO NOT RESUSCITATE at this point. Gentle medical treatment to be continued. Patient and family in agreement.    2. Essential hypertension. Since n.p.o. we'll use IV hydralazine as needed.    3. Dyslipidemia. Hold by mouth medications    4. Metastatic ovarian cancer. Failed for cycle of chemotherapy, long-term prognosis poor, discussed with oncologist Dr. Skeet Latch, for now supportive care with input with palliative care.      DVT Prophylaxis Heparin    AM Labs Ordered, also please review Full Orders  Family Communication: Admission, patients condition and plan of care including tests being ordered have been discussed with the patient and 2 daughters who indicate understanding and agree with the plan and Code Status.  Code Status DNR  Likely DC to  Home  Condition GUARDED     Time spent in minutes : 35    SINGH,PRASHANT K M.D on 11/28/2013 at 4:27 PM  Between 7am to 7pm - Pager - [phone]  After 7pm go to [email or web address] - password [email or web address]  And look for the night coverage person covering me after hours  [facility] Office   [phone]   **Disclaimer: This note may have been dictated with voice recognition software. Similar sounding words can inadvertently be transcribed and this note may contain transcription errors which may not have been corrected upon publication of note.**

## 2013-11-28 NOTE — ED Notes (Addendum)
Family at bedside. 

## 2013-11-28 NOTE — Consult Note (Signed)
Palliate obstruction with gastric decompression.  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  Could result in injury/morbidity without improving survival  Agree w palliative care / DNR discussion

## 2013-11-28 NOTE — ED Provider Notes (Signed)
CSN: [id number]     Arrival date & time 11/28/13  1124 History   First MD Initiated Contact with Patient 11/28/13 1132     Chief Complaint  Patient presents with  . Nausea  . Emesis  . Abdominal Pain     (Consider location/radiation/quality/duration/timing/severity/associated sxs/prior Treatment) HPI Patient presents to the emergency department with lower abdominal pain, that started 2 days, ago, with nausea and vomiting.  The patient was sent from the cancer center to rule out bowel obstruction.  The patient denies chest pain, shortness of breath, headache, blurred vision, weakness, numbness, dizziness, back pain, dysuria, lightheadedness, neck pain, rash, fever, or syncope.  The patient, states she didn't take any medications prior to arrival.  Patient, states, that palpation makes the pain, worse.  Nothing seems to make her condition, better. Past Medical History  Diagnosis Date  . Hypertension   . Hyperlipemia   . Thyroid disease   . Asthma   . Adnexal mass 05/16/2012  . 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 Cancer     [place]  . Breast cancer     s/p bilateral mastectomy [id number]  . Ovarian cancer 05/17/2012  . 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 Peripheral vascular disease 2013    hx of DVT right leg  . History of kidney stones   . Anemia   . Platelets decreased april 2014     1 transfusion given before chemo  . Deafness in left ear   . Numbness in feet   . Numbness of fingers of both hands   . Dizziness     occasional  . History of breast cancer 12/07/2012   Past Surgical History  Procedure Laterality Date  . Thyroid lobectomy    . Foot surgery      2 spurs taken out of foot  . Varicose vein surgery    . Bladder tack surgery     . Tonsillectomy    . Mastectomy      bilateral   . Laparotomy Bilateral 06/05/2012    Procedure: EXPLORATORY LAPAROTOMY WITH PELVIC TUMOR BIOPSY;  Surgeon: Imagene Gurney A. Alycia Rossetti, MD;   Location: [facility];  Service: Gynecology;  Laterality: Bilateral;  . Abdominal hysterectomy  1980  . Vascular surgery      veins stripped from both legs  . Portacath placement Left 06/25/2012    Procedure: INSERTION PORT-A-CATH;  Surgeon: Jamesetta So, MD;  Location: [facility];  Service: General;  Laterality: Left;  . Laparoscopy N/A 10/30/2012    Procedure: LAPAROSCOPY DIAGNOSTIC, EXPLORATORY LAPAROTOMY, BILATERAL SALPINGO-OOPHORECTOMY, TUMOR DEBULKING;  Surgeon: Imagene Gurney A. Alycia Rossetti, MD;  Location: [facility];  Service: Gynecology;  Laterality: N/A;   Family History  Problem Relation Age of Onset  . Cancer Mother 11    throat cancer, tobacco exposure  . Hypertension Father   . Heart attack Brother     5 brothers  . Hypertension Sister   . Cancer Other     niece through brother with breast cancer at unknown age   History  Substance Use Topics  . Smoking status: Never Smoker   . Smokeless tobacco: Never Used  . Alcohol Use: No   OB History   Grav Para Term Preterm Abortions TAB SAB Ect Mult Living                 Review of Systems   All other systems negative except as documented in the HPI. All pertinent  positives and negatives as reviewed in the HPI. Allergies  Gabapentin; Penicillins; Carboplatin; and Morphine and related  Home Medications   Prior to Admission medications   Medication Sig Start Date End Date Taking? Authorizing Provider  amLODipine (NORVASC) 10 MG tablet Take 10 mg by mouth daily before breakfast.    Yes Historical Provider, MD  docusate sodium (COLACE) 100 MG capsule Take 100 mg by mouth 2 (two) times daily.   Yes Historical Provider, MD  DULoxetine (CYMBALTA) 30 MG capsule Take 60 mg by mouth daily.   Yes Historical Provider, MD  fluticasone (FLONASE) 50 MCG/ACT nasal spray Place 2 sprays into both nostrils daily.   Yes Historical Provider, MD  ondansetron (ZOFRAN-ODT) 8 MG disintegrating tablet Take 8 mg by mouth every 8 (eight) hours as needed for nausea or  vomiting.   Yes Historical Provider, MD  oxyCODONE-acetaminophen (PERCOCET/ROXICET) 5-325 MG per tablet Take 1 tablet by mouth every 4 (four) hours as needed for severe pain.   Yes Historical Provider, MD  pantoprazole (PROTONIX) 40 MG tablet Take 40 mg by mouth daily.   Yes Historical Provider, MD  polyethylene glycol (MIRALAX / GLYCOLAX) packet Take 17 g by mouth 2 (two) times daily.   Yes Historical Provider, MD  potassium chloride (K-DUR,KLOR-CON) 10 MEQ tablet Take 10 mEq by mouth daily.   Yes Historical Provider, MD  prochlorperazine (COMPAZINE) 10 MG tablet Take 10 mg by mouth every 6 (six) hours as needed for nausea or vomiting.   Yes Historical Provider, MD   BP 125/59  Pulse 85  Temp(Src) 97.9 F (36.6 C) (Oral)  Resp 16  SpO2 92% Physical Exam  Constitutional: She is oriented to person, place, and time. She appears well-developed and well-nourished. No distress.  HENT:  Head: Normocephalic and atraumatic.  Mouth/Throat: Oropharynx is clear and moist.  Eyes: Pupils are equal, round, and reactive to light.  Neck: Normal range of motion. Neck supple.  Cardiovascular: Normal rate, regular rhythm and normal heart sounds.  Exam reveals no gallop and no friction rub.   No murmur heard. Pulmonary/Chest: Effort normal and breath sounds normal. No respiratory distress. She has no wheezes.  Abdominal: Soft. Bowel sounds are normal. She exhibits no distension. There is tenderness. There is guarding. There is no rebound.    Musculoskeletal: She exhibits no edema.  Neurological: She is alert and oriented to person, place, and time. She exhibits normal muscle tone. Coordination normal.    ED Course  Procedures (including critical care time) Labs Review Labs Reviewed  CBC WITH DIFFERENTIAL - Abnormal; Notable for the following:    WBC 10.6 (*)    RBC 5.33 (*)    HCT 46.2 (*)    All other components within normal limits  COMPREHENSIVE METABOLIC PANEL - Abnormal; Notable for the  following:    Chloride 95 (*)    Glucose, Bld 121 (*)    GFR calc non Af Amer 55 (*)    GFR calc Af Amer 64 (*)    All other components within normal limits  URINALYSIS, ROUTINE W REFLEX MICROSCOPIC - Abnormal; Notable for the following:    Color, Urine AMBER (*)    APPearance CLOUDY (*)    Bilirubin Urine SMALL (*)    Protein, ur 30 (*)    Leukocytes, UA TRACE (*)    All other components within normal limits  LIPASE, BLOOD  URINE MICROSCOPIC-ADD ON  I-STAT CG4 LACTIC ACID, ED    Imaging Review US Transvaginal Non-ob  11/28/2013  CLINICAL DATA:  History of ovarian cancer. Pelvic pain and pressure. Prior hysterectomy and bilateral oophorectomy.  EXAM: TRANSABDOMINAL AND TRANSVAGINAL ULTRASOUND OF PELVIS  TECHNIQUE: Both transabdominal and transvaginal ultrasound examinations of the pelvis were performed. Transabdominal technique was performed for global imaging of the pelvis including uterus, ovaries, adnexal regions, and pelvic cul-de-sac. It was necessary to proceed with endovaginal exam following the transabdominal exam to visualize the adnexal regions.  COMPARISON:  07/04/2013  FINDINGS: Uterus  Measurements: The uterus is surgically absent. The vaginal cuff has a normal appearance.  Endometrium  Thickness: The uterus is surgically absent.  Right ovary  Measurements: Status post oophorectomy. No adnexal mass  Left ovary  Measurements: Status post oophorectomy. No adnexal mass  Other findings  A small amount of free pelvic fluid is identified. The patient thinks she is scheduled for paracentesis later today.  IMPRESSION: 1. No evidence for pelvic mass. 2. Status post hysterectomy and bilateral salpingo-oophorectomy. 3. Small amount of free pelvic fluid.   Electronically Signed   By: Shon Hale M.D.   On: 11/28/2013 09:01   US Pelvis Complete  11/28/2013   CLINICAL DATA:  History of ovarian cancer. Pelvic pain and pressure. Prior hysterectomy and bilateral oophorectomy.  EXAM:  TRANSABDOMINAL AND TRANSVAGINAL ULTRASOUND OF PELVIS  TECHNIQUE: Both transabdominal and transvaginal ultrasound examinations of the pelvis were performed. Transabdominal technique was performed for global imaging of the pelvis including uterus, ovaries, adnexal regions, and pelvic cul-de-sac. It was necessary to proceed with endovaginal exam following the transabdominal exam to visualize the adnexal regions.  COMPARISON:  07/04/2013  FINDINGS: Uterus  Measurements: The uterus is surgically absent. The vaginal cuff has a normal appearance.  Endometrium  Thickness: The uterus is surgically absent.  Right ovary  Measurements: Status post oophorectomy. No adnexal mass  Left ovary  Measurements: Status post oophorectomy. No adnexal mass  Other findings  A small amount of free pelvic fluid is identified. The patient thinks she is scheduled for paracentesis later today.  IMPRESSION: 1. No evidence for pelvic mass. 2. Status post hysterectomy and bilateral salpingo-oophorectomy. 3. Small amount of free pelvic fluid.   Electronically Signed   By: Shon Hale M.D.   On: 11/28/2013 09:01   Ct Abdomen Pelvis W Contrast  11/28/2013   CLINICAL DATA:  Nausea and vomiting abdominal pain  EXAM: CT ABDOMEN AND PELVIS WITH CONTRAST  TECHNIQUE: Multidetector CT imaging of the abdomen and pelvis was performed using the standard protocol following bolus administration of intravenous contrast.  CONTRAST:  80mL OMNIPAQUE IOHEXOL 300 MG/ML SOLN, 142mL OMNIPAQUE IOHEXOL 300 MG/ML SOLN  COMPARISON:  10/03/2013  FINDINGS: The lung bases are free of acute infiltrate or sizable effusion.  The liver is diffusely decreased in attenuation consistent with fatty infiltration. The gallbladder, spleen, adrenal glands and pancreas are within normal limits. The kidneys are well visualized bilaterally. 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 Parapelvic cysts are seen. No renal calculi or urinary tract  obstructive changes are noted.  Bladder is incompletely distended. There has been prior hysterectomy and oophorectomy. There are multiple dilated loops of small bowel identified with a transition zone deep within the pelvis in the distal ileum. There is considerable amount of inflammatory change and thickening of the mesenteric surrounding the transition zone. 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 A short segment of distal terminal ileum  is noted prior to the ascending colon. Small amount of ascites is seen. No acute bony abnormality is noted.  IMPRESSION: Changes consistent with small bowel obstruction in the distal ileum just before the terminal ileum. A considerable amount of mesenteric thickening is noted surrounding this region. These changes were present on the prior PET-CT.  Chronic changes as described above.   Electronically Signed   By: Inez Catalina M.D.   On: 11/28/2013 14:22      MDM   Final diagnoses:  SBO (small bowel obstruction)    I spoke with Will Creig Hines, PA-C from general surgery, who will be down to see the patient also spoke with the, [facility], who will admit the patient for further evaluation and care    Brent General, PA-C 11/28/13 1545

## 2013-11-28 NOTE — ED Notes (Signed)
Bed: WA21 Expected date:  Expected time:  Means of arrival:  Comments: Rita Humphrey from cancer center

## 2013-11-28 NOTE — ED Provider Notes (Signed)
73 year old female, history of ovarian cancer, history of exploratory laparotomy in the past who presents with a complaint of abdominal pain and distention with associated nausea and vomiting worsening over the last 2 days. Sent from the oncology office is for evaluation. On exam the patient has a distended tympanitic to percussion abdomen which is mildly tender diffusely. She has no peritoneal signs, no tachycardia and otherwise appears to be in minimal discomfort. Labs and CT scan suggests that the patient has a small bowel obstruction, NG tube will need to be placed, IV fluids for hydration, we'll discuss with general surgical team versus medical team for admission. The patient was informed of the treatment plan and is in agreement.  Medical screening examination/treatment/procedure(s) were conducted as a shared visit with non-physician practitioner(s) and myself.  I personally evaluated the patient during the encounter.  Clinical Impression:   Final diagnoses:  SBO (small bowel obstruction)         Johnna Acosta, MD 11/29/13 1921

## 2013-11-28 NOTE — Progress Notes (Signed)
Office Visit:  GYN ONCOLOGY   Rita Humphrey 73 y.o. female  CC: Abdominal pain, vomiting  Assessment/Plan: Progressive ovarian cancer  73 year old status post neoadjuvant chemotherapy and optimal cytoreduction after 7 cycles of taxanes and platinum based chemotherapy. 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 She also high-grade metastatic cancer within the omentum. Her tumor was sent for precision therapeutics. Based on the precision therapeutics assay she was treated with 4 cycles of etoposide. She was then placed on maintenance Avastin for November to May 2015. Her last cycle of a taxane and carboplatin was in July 2014 and her CA 125 began rising in March of 2015. She had persistent disease she is most likely has a  degree of platinum and taxanes resistance. I PET scan CA 125 she has recurrent disease.  Patient desires treatment.  She understands that treatment cannot occur without reliable nutritional support.  She is disappointed that the [place] do not demonstrate significant fluid that can be obtained for chemoresistance testing.  Possible bowell obstruction versus carcinomatous ileus vs obstipation Today I am  concerned she might have a partial small bowel obstruction.  Will direct to the ER for imaging labs and hydration.  Recommend CT scan as it will both identify if there is an obstruction but will also provide information regarding the extend of the carcinomatosis.   HPI: Rita Humphrey is a 73 y.o.  gravida 3 para 3, who had been experiencing abdominal pain from November of 2013. until February 2014 when she underwent a CT scan of the abdomen and pelvis on May 14, 2012. It reveals a moderate amount of ascites in the  peritoneal cavity. There are multiple soft tissue lesions noted throughout the peritoneal cavity consistent with peritoneal implants of tumor with omental caking as well. The abdominal  structures are unremarkable. The pelvis has ascites as well as multiple peritoneal implants of tumor.. She underwent a paracentesis on May 15, 2012. Pathology revealed malignant cells consistent with carcinoma. The malignant cells are positive for cytokeratin 7 and show a morphologic and immunophenotypic finding consistent with a gynecologic primary. CA 125 has been drawn that was over 1000.  She underwent exploratory laparotomy and peritoneal biopsies on June 05, 2012. Operative findings included 3.7 L of ascites along with diffuse carcinomatosis involving the small bowel mesentery with agglutination of the small bowel mesentery. There was complete coverage of the right hemidiaphragm with tumor. The transverse colon and omentum were diffusely replaced with tumor that was not resectable. The left upper quadrant was agglutinated with tumor and there is no large abdominal pelvic mass in the left upper quadrant. There is diffuse subcentimeter disease covering almost the entire surface of the small bowel mesentery. The right ovary was agglutinated to the rectosigmoid colon with a thick rind of tumor connecting it. She was not deemed resectable. 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 She underwent 1 cycle of Taxotere and carboplatin based chemotherapy in the hospital.   After 4 cycles of chemotherapy,  she underwent a diagnostic laparoscopy, exploratory laparotomy, bilateral salpingo-oophorectomy, total omentectomy, and radical tumor debulking along with mobilization of splenic and hepatic flexure  10/30/12. (Chemo resistance testing of the tumor noted responsiveness to carboplatin, cisplatin, docetaxel, etoposide and paclitaxel. She had intermediate response to doxorubicin, ifosfamide, Topotecan, and doublet therapy with carboplatin and Doxil, docetaxel and carboplatin gemcitabine, and carboplatin and paclitaxel. She was  nonresponsive to gemcitabine.) She was treated with 4 cycles of etoposide. After her chemotherapy she was then placed on maintenance Avastin. In March of 2015 her CA-125 was 39.5.It increased  524.6 on 08/24/2013. She had a PET CT scan June 2015  with pertinent findings of   small amount of fluid or thickening at the ventral abdominal surgical wound/scar approximately 2.2 x 1.6 x 3.5 cm. Mild protrusion of infraumbilical midline fascia but grossly intact without herniation. No mass, adenopathy, free fluid, or free air. Specifically, no gastrohepatic ligament adenopathy. Uterus surgically absent with nonvisualization of ovaries. Slightly decreased prominence of LEFT pelvic soft tissue density seen previously. Area of soft tissue density in the RIGHT pelvis 2.2 x 0.9 cm image 108, minimally more prominent though uncertain if represents residual tumor or slightly increased scarring. Bladder and ureters unremarkable. No acute osseous findings, with tiny bone island at L1 stable.  IMPRESSION:  -No acute intra thoracic abnormalities.  -Few tiny calcified and noncalcified pulmonary nodules stable since previous exam.  -ILATERAL parapelvic renal cysts and small LEFT renal angiomyolipoma.  -Small supraumbilical Richter hernia containing a small portion of the mid transverse colon without bowel wall thickening or obstruction.  -Resolution of omental and mesenteric tumor infiltration since 10/01/2012.  -Areas soft tissue in the RIGHT pelvis is minimal or prominent than on the previous exam, approximately 2.2 x 0.9 cm, uncertain if represents progressive scarring, difficult to exclude residual tumor though this the demonstrated no FDG localization on PET-CT ; recommend attention on followup exams.  She had a PET scan 10/03/13 revealed: IMPRESSION:  1. New peritoneal spread of tumor, with bandlike thickening along the paracolic gutters (left greater than right) and in the lower pelvis with high metabolic activity  compatible with malignancy.  2. Asymmetric high activity in the right lingual tonsil compared to the left, probably incidental, but may merit ENT referral/direct visualization.  3. Ancillary findings include a left renal angiomyolipoma and sigmoid diverticulosis.  She was admitted from the office 10/30/2013 when she presented with c/o abdominal pain,nausea and emesis.  She was admitted and an abdominal series at that time demonstrated constipation.  Patient states that she did not have resolution of her constipation during that visit.  She presents today 11/28/2013  with c/o intermittent abdominal pain reports loud bowel sounds.  She  Is intermittently able to tolerate water.  Reports intermittent emesis progressively worsening since 11/24/2013.  No flatus or BM since then.  Abdominal pain and bloating are her primary complaints.  Pelvic UTZ 11/28/2013 without evidence of ascites or pelvic mass.  The abdominal pain is worse in the left upper and lower quadrants but involves all quadrants.  Pain occurs followed by belching then hiccups then emesis that is dark green in colour.   Emesis reduces the pain.  Reports abdominal bloating.  Reports throat discomfort and burning.  Patient wishes to receive chemotherapy and understands that this is a manifestation of disease progression.  She does not wish an operative procedure and would prefer to go home and manage the bowel symptoms with MOM.    Review of Systems  Constitutional:   Denies fever. Skin: No rash Cardiovascular: No chest pain, shortness of breath, or edema  Pulmonary: + cough  Gastro Intestinal: + nausea, + vomiting, no constipation, or diarrhea reported. No bright red blood per rectum or change in bowel movement. No flatus or BM for 4 days. Reports audible intermittent bowel sounds. Genitourinary: No frequency Musculoskeletal: No myalgia, +arthralgia,+ joint swelling, + pain.  Neurologic:  No weakness, + numbness   Current Meds:  Outpatient  Encounter Prescriptions as of 11/28/2013  Medication Sig  . amLODipine (NORVASC) 10 MG tablet Take 10 mg by mouth daily before breakfast.   . benzonatate (TESSALON) 100 MG capsule Take 1 capsule (100 mg total) by mouth as needed.  . Cholecalciferol (VITAMIN D-3) 1000 UNITS CAPS Take 1 capsule by mouth daily.  Marland Kitchen docusate sodium 100 MG CAPS Take 100 mg by mouth 2 (two) times daily.  . DULoxetine (CYMBALTA) 30 MG capsule Take two capsules at bedtime for neuropathy symptoms or as directed.  . fluticasone (FLONASE) 50 MCG/ACT nasal spray 2 sprays in each nostril at bedtime  . lidocaine-prilocaine (EMLA) cream Apply a quarter size amount to port site 1 hour prior to chemo. Do not rub in. Cover with plastic wrap.  . magnesium hydroxide (MILK OF MAGNESIA) 400 MG/5ML suspension Take 30 mLs by mouth daily as needed for mild constipation.  . metoCLOPramide (REGLAN) 10 MG tablet Take 1 tablet (10 mg total) by mouth 4 (four) times daily -  before meals and at bedtime.  . ondansetron (ZOFRAN ODT) 8 MG disintegrating tablet Take 1 tablet (8 mg total) by mouth every 8 (eight) hours as needed for nausea or vomiting.  Marland Kitchen oxyCODONE-acetaminophen (PERCOCET/ROXICET) 5-325 MG per tablet Take 1 or 2 tablets every 4 hours as needed for pain  . pantoprazole (PROTONIX) 40 MG tablet Take 1 tablet (40 mg total) by mouth daily.  . polyethylene glycol (MIRALAX / GLYCOLAX) packet Take 17 g by mouth 2 (two) times daily.  . potassium chloride SA (K-DUR,KLOR-CON) 20 MEQ tablet Take 1 tablet (20 mEq total) by mouth daily.  . prochlorperazine (COMPAZINE) 10 MG tablet Take 1 tablet (10 mg total) by mouth every 6 (six) hours as needed for nausea, vomiting or refractory nausea / vomiting.  . temazepam (RESTORIL) 15 MG capsule Take 15 mg by mouth at bedtime as needed for sleep.  Sallye Lat (VISINE ADVANCED RELIEF) 0.05-0.1-1-1 % SOLN Place 1 drop into both eyes daily as needed.    Allergy:  Allergies  Allergen  Reactions  . Gabapentin Diarrhea  . Penicillins Anaphylaxis and Nausea And Vomiting  . Carboplatin   . Morphine And Related Nausea And Vomiting    Social Hx:   History   Social History  . Marital Status: Widowed    Spouse Name: N/A    Number of Children: N/A  . Years of Education: N/A   Occupational History  . Not on file.   Social History Main Topics  . Smoking status: Never Smoker   . Smokeless tobacco: Never Used  . Alcohol Use: No  . Drug Use: No  . Sexual Activity: No   Other Topics Concern  . Not on file   Social History Narrative  . No narrative on file    Past Surgical Hx:  Past Surgical History  Procedure Laterality Date  . Thyroid lobectomy    . Foot surgery      2 spurs taken out of foot  . Varicose vein surgery    . Bladder tack surgery     . Tonsillectomy    . Mastectomy      bilateral   . Laparotomy Bilateral 06/05/2012    Procedure: EXPLORATORY LAPAROTOMY WITH PELVIC TUMOR BIOPSY;  Surgeon: Imagene Gurney A. Alycia Rossetti, MD;  Location: [facility];  Service: Gynecology;  Laterality: Bilateral;  . Abdominal hysterectomy  1980  . Vascular surgery      veins stripped from both  legs  . Portacath placement Left 06/25/2012    Procedure: INSERTION PORT-A-CATH;  Surgeon: Jamesetta So, MD;  Location: [facility];  Service: General;  Laterality: Left;  . Laparoscopy N/A 10/30/2012    Procedure: LAPAROSCOPY DIAGNOSTIC, EXPLORATORY LAPAROTOMY, BILATERAL SALPINGO-OOPHORECTOMY, TUMOR DEBULKING;  Surgeon: Imagene Gurney A. Alycia Rossetti, MD;  Location: [facility];  Service: Gynecology;  Laterality: N/A;    Past Medical Hx:  Past Medical History  Diagnosis Date  . Hypertension   . Hyperlipemia   . Thyroid disease   . Asthma   . Adnexal mass 05/16/2012  . 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 Cancer     [place]  . Breast cancer     s/p bilateral mastectomy [id number]  . Ovarian cancer 05/17/2012  . 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  Peripheral vascular disease 2013    hx of DVT right leg  . History of kidney stones   . Anemia   . Platelets decreased april 2014     1 transfusion given before chemo  . Deafness in left ear   . Numbness in feet   . Numbness of fingers of both hands   . Dizziness     occasional  . History of breast cancer 12/07/2012    Oncology Hx:    Ovarian cancer   05/15/2012 Initial Diagnosis IIIC ovarian cancer   06/05/2012 Surgery suboptimal debulking   06/07/2012 - 06/07/2012 Chemotherapy Carboplatin/Docetaxel as inpatient at [facility] by Dr. Beryle Beams.  1 cycle.   06/28/2012 - 08/09/2012 Chemotherapy Carboplatin/Paclitaxel x 3 cycles   08/30/2012 - 09/20/2012 Chemotherapy Carboplatin/Abraxane x 2 cycles (due to increasing peripheral neuropathy)   10/30/2012 Surgery exploratory laparotomy, optimal debulking. Tumor sent to precision therapeutics   10/31/2012 Remission    12/17/2012 - 02/20/2013 Chemotherapy Etoposide x 4 cycles   03/19/2013 - 07/15/2013 Chemotherapy Avastin maintenance   08/24/2013 Progression Increasing CA-125, negative PET scan   10/03/2013 Progression PET scan- New peritoneal spread of tumor, with bandlike thickening along the paracolic gutters (left greater than right) and in the lower pelvis with high metabolic activity compatible with malignancy.     Family Hx:  Family History  Problem Relation Age of Onset  . Cancer Mother 44    throat cancer, tobacco exposure  . Hypertension Father   . Heart attack Brother     5 brothers  . Hypertension Sister   . Cancer Other     niece through brother with breast cancer at unknown age    28:  BP 130/78  Pulse 98  Temp(Src) 98.4 F (36.9 C) (Oral)  Resp 16  Ht 5\' 3"  (1.6 m)  Wt 186 lb 11.2 oz (84.687 kg)  BMI 33.08 kg/m2 Wt Readings from Last 3 Encounters:  11/28/13 186 lb 11.2 oz (84.687 kg)  11/19/13 190 lb (86.183 kg)  10/30/13 186 lb 11.2 oz (84.687 kg)    Physical Exam: Well-nourished well-developed female in  no acute distress. Appropriate skin turgor.  Mouth is not dry.   Chest:  CTA Cardiac:  RRR Abdomen: Well-healed vertical midline incision. Soft, tender with deep palpation in the left lower quadrant.  No fluid wave. No hepatomegaly. No palpable masses. Loudest bowel sounds appreciated in the LLQ,  no rebound, no guarding Pelvic:  No cul de sac nodularity, no pelvic masses Rectal:  Good tone no stool in vault Groins: No lymphadenopathy. Extremities: 1+ nonpitting edema equal bilaterally. Well-healed surgical incisions.   Kipper Buch,  MD 11/28/2013, 9:37 AM

## 2013-11-29 ENCOUNTER — Inpatient Hospital Stay (HOSPITAL_COMMUNITY): Payer: Medicare HMO

## 2013-11-29 ENCOUNTER — Ambulatory Visit: Payer: Medicare HMO | Admitting: Gynecologic Oncology

## 2013-11-29 DIAGNOSIS — M545 Low back pain, unspecified: Secondary | ICD-10-CM

## 2013-11-29 DIAGNOSIS — E43 Unspecified severe protein-calorie malnutrition: Secondary | ICD-10-CM | POA: Insufficient documentation

## 2013-11-29 DIAGNOSIS — Z515 Encounter for palliative care: Secondary | ICD-10-CM

## 2013-11-29 LAB — BASIC METABOLIC PANEL
Anion gap: 10 (ref 5–15)
BUN: 13 mg/dL (ref 6–23)
CO2: 25 mEq/L (ref 19–32)
CREATININE: 0.81 mg/dL (ref 0.50–1.10)
Calcium: 8.7 mg/dL (ref 8.4–10.5)
Chloride: 105 mEq/L (ref 96–112)
GFR, EST AFRICAN AMERICAN: 82 mL/min — AB (ref >90)
GFR, EST NON AFRICAN AMERICAN: 70 mL/min — AB (ref >90)
Glucose, Bld: 100 mg/dL — ABNORMAL HIGH (ref 70–99)
Potassium: 4.1 mEq/L (ref 3.7–5.3)
Sodium: 140 mEq/L (ref 137–147)

## 2013-11-29 LAB — CBC
HCT: 40.5 % (ref 36.0–46.0)
Hemoglobin: 12.8 g/dL (ref 12.0–15.0)
MCH: 27.7 pg (ref 26.0–34.0)
MCHC: 31.6 g/dL (ref 30.0–36.0)
MCV: 87.7 fL (ref 78.0–100.0)
PLATELETS: 310 10*3/uL (ref 150–400)
RBC: 4.62 MIL/uL (ref 3.87–5.11)
RDW: 15.2 % (ref 11.5–15.5)
WBC: 8.3 10*3/uL (ref 4.0–10.5)

## 2013-11-29 MED ORDER — PANTOPRAZOLE SODIUM 40 MG IV SOLR
40.0000 mg | Freq: Two times a day (BID) | INTRAVENOUS | Status: DC
  Administered 2013-11-29 – 2013-12-03 (×9): 40 mg via INTRAVENOUS
  Filled 2013-11-29 (×11): qty 40

## 2013-11-29 MED ORDER — ACETAMINOPHEN 650 MG RE SUPP: 650.0000 mg | Freq: Four times a day (QID) | RECTAL | Status: DC | PRN

## 2013-11-29 MED ORDER — DEXAMETHASONE SODIUM PHOSPHATE 4 MG/ML IJ SOLN
4.0000 mg | Freq: Two times a day (BID) | INTRAMUSCULAR | Status: DC
  Administered 2013-11-29 – 2013-12-03 (×8): 4 mg via INTRAVENOUS
  Filled 2013-11-29 (×9): qty 1

## 2013-11-29 MED ORDER — CLOTRIMAZOLE 1 % VA CREA
1.0000 | TOPICAL_CREAM | Freq: Every day | VAGINAL | Status: DC
  Administered 2013-11-29 – 2013-12-02 (×4): 1 via VAGINAL
  Filled 2013-11-29: qty 45

## 2013-11-29 NOTE — Progress Notes (Signed)
Patient ID:Rita Humphrey      DOB: 1940/08/09      EYC:[id number]     Consult Note from the Palliative Medicine Team at [facility] Requested by: Dr. Candiss Norse    PCP: Delphina Cahill, MD Reason for Consultation: [place] and options.   Phone Number:[phone]  Assessment of patients Current state: I met today with Rita Humphrey who tells me that they are planning "surgery for drain in stomach." She also tells me that she did not know that her cancer was this bad and feels like she was hit with a ton of bricks to be in this position. She also tells me that after G-tube placement that likely will be Monday that the plan is to go home and that they have been talking about hospice. She is quite overwhelmed to believe she is eligible for hospice. We discussed that her nutrition with this obstruction will make her weak, fatigued, and sleeping more and more. I explain what hospice can offer and that they are not in the home very much time and that she will need the help from her family. She tells me her daughter, Lattie Haw, lives next door and is available to her and that she has a step-daughter-Carleen who recently lost her husband to cancer. She also has 2 sons. Lattie Haw is her main support. Rita Humphrey asks me about how much time she has left and I suggest that could be weeks. She was sad to hear this news and said that Lattie Haw is not going to be accepting of her poor prognosis. She says that Lattie Haw keeps telling her how strong she is and that she is a Nurse, adult. I offered to call Lattie Haw to talk to her or at least answer her questions but she preferred that I wait and let some of the information they have received so far process. She confirms that she wishes to be comfortable at home with her family. I will follow up on Monday - please call [phone] with any palliative needs over the weekend.    Goals of Care: 1.  Code Status: DNR   2. Scope of Treatment: Continue supportive treatment. Comfort is priority. Proceed with  palliative G-tube and then home with hospice.    4. Disposition: Home with hospice.    3. Symptom Management:   1. Malignant bowel obstruction: Decadron 4 mg every 12 hours. Consider octreotide infusion/SQ and/or haldol if needed to help decrease secretions and decrease nausea symptoms. Proceed with venting G-tube.  2. Pain: Fentanyl 50 mcg every 2 hours prn. Consider oxycodone intensol 5 mg prn hourly if needed after G-tube placed. K-pad to abdomen.  3. Cough: Robitussin DM prn.  4. Itching: Benadryl prn.  5. Bowel Regimen: Dulcolax daily.  6. Fever: Acetaminophen supp prn.  7. Nausea/Vomiting: Ondansetron every 6 hours. Phenergan 6.25-12.5 mg every 4 hours prn.   4. Psychosocial: Emotional support provided to patient during difficult conversation.   5. Spiritual: Offered chaplain support. Rita Humphrey says that her daughter's pastor has been visiting with her for spiritual support.    Brief HPI: 73 yo female with malignant obstruction r/t metastatic ovarian cancer with mesentery and omental mets with recommendations for palliative G-tube placement. PMH significant hypertension, dyslipidemia, thyroid disease, asthma, arthritis, kidney stones, left ear deafness, dizziness, breast cancer.    ROS: + abdominal pain, + nausea    PMH:  Past Medical History  Diagnosis Date  . Hypertension   . Hyperlipemia   . Thyroid disease   .  Asthma   . Adnexal mass 05/16/2012  . 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 Cancer     [place]  . Breast cancer     s/p bilateral mastectomy [id number]  . Ovarian cancer 05/17/2012  . 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 Peripheral vascular disease 2013    hx of DVT right leg  . History of kidney stones   . Anemia   . Platelets decreased april 2014     1 transfusion given before chemo  . Deafness in left ear   . Numbness in feet   . Numbness of fingers of both hands   . Dizziness     occasional   . History of breast cancer 12/07/2012     PSH: Past Surgical History  Procedure Laterality Date  . Thyroid lobectomy    . Foot surgery      2 spurs taken out of foot  . Varicose vein surgery    . Bladder tack surgery     . Tonsillectomy    . Mastectomy      bilateral   . Laparotomy Bilateral 06/05/2012    Procedure: EXPLORATORY LAPAROTOMY WITH PELVIC TUMOR BIOPSY;  Surgeon: Imagene Gurney A. Alycia Rossetti, MD;  Location: [facility];  Service: Gynecology;  Laterality: Bilateral;  . Abdominal hysterectomy  1980  . Vascular surgery      veins stripped from both legs  . Portacath placement Left 06/25/2012    Procedure: INSERTION PORT-A-CATH;  Surgeon: Jamesetta So, MD;  Location: [facility];  Service: General;  Laterality: Left;  . Laparoscopy N/A 10/30/2012    Procedure: LAPAROSCOPY DIAGNOSTIC, EXPLORATORY LAPAROTOMY, BILATERAL SALPINGO-OOPHORECTOMY, TUMOR DEBULKING;  Surgeon: Imagene Gurney A. Alycia Rossetti, MD;  Location: [facility];  Service: Gynecology;  Laterality: N/A;   I have reviewed the [place] and SH and  If appropriate update it with new information. Allergies  Allergen Reactions  . Gabapentin Diarrhea  . Penicillins Anaphylaxis and Nausea And Vomiting  . Carboplatin   . Morphine And Related Nausea And Vomiting   Scheduled Meds: . bisacodyl  10 mg Rectal Daily  . clotrimazole  1 Applicatorful Vaginal QHS  . heparin  5,000 Units Subcutaneous 3 times per day  . lip balm  1 application Topical BID  . ondansetron  4 mg Intravenous Q6H  . pantoprazole (PROTONIX) IV  40 mg Intravenous Q12H   Continuous Infusions: . dextrose 5 % and 0.45 % NaCl with KCl 20 mEq/L 1,000 mL (11/29/13 0731)   PRN Meds:.acetaminophen, alum & mag hydroxide-simeth, diphenhydrAMINE, fentaNYL, guaiFENesin-dextromethorphan, hydrALAZINE, lactated ringers, lidocaine-prilocaine, magic mouthwash, menthol-cetylpyridinium, metoprolol, ondansetron (ZOFRAN) IV, ondansetron (ZOFRAN) IV, phenol, polyethylene glycol, promethazine    BP 131/66  Pulse 80   Temp(Src) 98.4 F (36.9 C) (Oral)  Resp 16  Ht _0  (1.6 m)  Wt 86.274 kg (190 lb 3.2 oz)  BMI 33.70 kg/m2  SpO2 97%   PPS: 30%   Intake/Output Summary (Last 24 hours) at 11/29/13 1543 Last data filed at 11/29/13 1200  Gross per 24 hour  Intake    300 ml  Output   1250 ml  Net   -950 ml   LBM: 11/28/13                         Physical Exam:  General: NAD, resting in bed HEENT: [place]/AT, no JVD, moist mucous membranes Chest: No labored breathing, symmetric CVS: RRR, S1 S2 Abdomen: Soft, NT, ND, +  BS Ext: Trace BLE edema, warm to touch, no cyanosis Neuro: Awake, alert, oriented x 3, follows commands  Labs: CBC    Component Value Date/Time   WBC 8.3 11/29/2013 0549   WBC 11.9* 10/30/2013 1004   RBC 4.62 11/29/2013 0549   RBC 5.50* 10/30/2013 1004   HGB 12.8 11/29/2013 0549   HGB 15.1 10/30/2013 1004   HCT 40.5 11/29/2013 0549   HCT 47.1* 10/30/2013 1004   PLT 310 11/29/2013 0549   PLT 358 10/30/2013 1004   MCV 87.7 11/29/2013 0549   MCV 85.7 10/30/2013 1004   MCH 27.7 11/29/2013 0549   MCH 27.4 10/30/2013 1004   MCHC 31.6 11/29/2013 0549   MCHC 31.9 10/30/2013 1004   RDW 15.2 11/29/2013 0549   RDW 15.5* 10/30/2013 1004   LYMPHSABS 2.3 11/28/2013 1201   LYMPHSABS 1.8 10/30/2013 1004   MONOABS 1.0 11/28/2013 1201   MONOABS 0.6 10/30/2013 1004   EOSABS 0.2 11/28/2013 1201   EOSABS 0.0 10/30/2013 1004   BASOSABS 0.0 11/28/2013 1201   BASOSABS 0.1 10/30/2013 1004    BMET    Component Value Date/Time   NA 140 11/29/2013 0549   NA 141 10/30/2013 1004   K 4.1 11/29/2013 0549   K 4.3 10/30/2013 1004   CL 105 11/29/2013 0549   CO2 25 11/29/2013 0549   CO2 27 10/30/2013 1004   GLUCOSE 100* 11/29/2013 0549   GLUCOSE 132 10/30/2013 1004   BUN 13 11/29/2013 0549   BUN 14.3 10/30/2013 1004   CREATININE 0.81 11/29/2013 0549   CREATININE 0.9 10/30/2013 1004   CALCIUM 8.7 11/29/2013 0549   CALCIUM 9.8 10/30/2013 1004   GFRNONAA 70* 11/29/2013 0549   GFRAA 82* 11/29/2013 0549    CMP     Component  Value Date/Time   NA 140 11/29/2013 0549   NA 141 10/30/2013 1004   K 4.1 11/29/2013 0549   K 4.3 10/30/2013 1004   CL 105 11/29/2013 0549   CO2 25 11/29/2013 0549   CO2 27 10/30/2013 1004   GLUCOSE 100* 11/29/2013 0549   GLUCOSE 132 10/30/2013 1004   BUN 13 11/29/2013 0549   BUN 14.3 10/30/2013 1004   CREATININE 0.81 11/29/2013 0549   CREATININE 0.9 10/30/2013 1004   CALCIUM 8.7 11/29/2013 0549   CALCIUM 9.8 10/30/2013 1004   PROT 7.6 11/28/2013 1201   ALBUMIN 3.8 11/28/2013 1201   AST 21 11/28/2013 1201   ALT 28 11/28/2013 1201   ALKPHOS 95 11/28/2013 1201   BILITOT 0.7 11/28/2013 1201   GFRNONAA 70* 11/29/2013 0549   GFRAA 82* 11/29/2013 0549    Time In Time Out Total Time Spent with Patient Total Overall Time  1445 1535 90mn 531m    Greater than 50%  of this time was spent counseling and coordinating care related to the above assessment and plan.  AlVinie SillNP Palliative Medicine Team Pager # [phone]-F 8a-5p) Team Phone # [phone]/Weekends)

## 2013-11-29 NOTE — Plan of Care (Signed)
Problem: Phase III Progression Outcomes Goal: Voiding independently Outcome: Progressing Patient voided with assist to the [facility].

## 2013-11-29 NOTE — Progress Notes (Signed)
Clinically better.  Agree with Dr. Denman George.  Will be available for operative G-tube if needed.

## 2013-11-29 NOTE — Progress Notes (Signed)
Patient ID: Rita Humphrey, female   DOB: [birth year], 73 y.o.   MRN: [id number]    Subjective: Pt feels better today.  Had a BM this morning.  No other flatus though.  No abdominal pain  Objective: Vital signs in last 24 hours: Temp:  [97.9 F (36.6 C)-98.7 F (37.1 C)] 98.4 F (36.9 C) (09/25 0510) Pulse Rate:  [80-98] 80 (09/25 0510) Resp:  [16-22] 16 (09/25 0510) BP: (125-159)/(59-79) 131/66 mmHg (09/25 0510) SpO2:  [92 %-98 %] 97 % (09/25 0510) Weight:  [186 lb 11.2 oz (84.687 kg)-192 lb 10.9 oz (87.4 kg)] 190 lb 3.2 oz (86.274 kg) (09/24 2047) Last BM Date: 11/24/13  Intake/Output from previous day: 09/24 0701 - 09/25 0700 In: 60  Out: 450 [Urine:450] Intake/Output this shift: Total I/O In: 120 [Other:120] Out: -   PE: Abd: soft, minimally tender in upper portion of abdomen, but otherwise NT, few BS, ND, NGT with watered down bilious output Heart: regular Lungs: CTAB  Lab Results:   Recent Labs  11/28/13 1201 11/29/13 0549  WBC 10.6* 8.3  HGB 14.8 12.8  HCT 46.2* 40.5  PLT 360 310   BMET  Recent Labs  11/28/13 1201 11/29/13 0549  NA 139 140  K 3.8 4.1  CL 95* 105  CO2 30 25  GLUCOSE 121* 100*  BUN 21 13  CREATININE 0.99 0.81  CALCIUM 9.8 8.7   PT/INR No results found for this basename: LABPROT, INR,  in the last 72 hours CMP     Component Value Date/Time   NA 140 11/29/2013 0549   NA 141 10/30/2013 1004   K 4.1 11/29/2013 0549   K 4.3 10/30/2013 1004   CL 105 11/29/2013 0549   CO2 25 11/29/2013 0549   CO2 27 10/30/2013 1004   GLUCOSE 100* 11/29/2013 0549   GLUCOSE 132 10/30/2013 1004   BUN 13 11/29/2013 0549   BUN 14.3 10/30/2013 1004   CREATININE 0.81 11/29/2013 0549   CREATININE 0.9 10/30/2013 1004   CALCIUM 8.7 11/29/2013 0549   CALCIUM 9.8 10/30/2013 1004   PROT 7.6 11/28/2013 1201   ALBUMIN 3.8 11/28/2013 1201   AST 21 11/28/2013 1201   ALT 28 11/28/2013 1201   ALKPHOS 95 11/28/2013 1201   BILITOT 0.7 11/28/2013 1201   GFRNONAA 70* 11/29/2013 0549    GFRAA 82* 11/29/2013 0549   Lipase     Component Value Date/Time   LIPASE 13 11/28/2013 1201       Studies/Results: Dg Abd 1 View  11/28/2013   CLINICAL DATA:  Nasogastric tube placement.  EXAM: ABDOMEN - 1 VIEW  COMPARISON:  10/30/2013  FINDINGS: Nasogastric tube tip overlies the level of the stomach. There is dilatation of small bowel loops, measuring up to 4 cm in diameter. No evidence for free intraperitoneal air. Contrast is identified within the bladder and collecting systems following CT exam.  IMPRESSION: Nasogastric tube tip overlying the level of the stomach.  Small bowel obstruction.   Electronically Signed   By: Shon Hale M.D.   On: 11/28/2013 17:11   US Transvaginal Non-ob  11/28/2013   CLINICAL DATA:  History of ovarian cancer. Pelvic pain and pressure. Prior hysterectomy and bilateral oophorectomy.  EXAM: TRANSABDOMINAL AND TRANSVAGINAL ULTRASOUND OF PELVIS  TECHNIQUE: Both transabdominal and transvaginal ultrasound examinations of the pelvis were performed. Transabdominal technique was performed for global imaging of the pelvis including uterus, ovaries, adnexal regions, and pelvic cul-de-sac. It was necessary to proceed with endovaginal exam following the transabdominal  exam to visualize the adnexal regions.  COMPARISON:  07/04/2013  FINDINGS: Uterus  Measurements: The uterus is surgically absent. The vaginal cuff has a normal appearance.  Endometrium  Thickness: The uterus is surgically absent.  Right ovary  Measurements: Status post oophorectomy. No adnexal mass  Left ovary  Measurements: Status post oophorectomy. No adnexal mass  Other findings  A small amount of free pelvic fluid is identified. The patient thinks she is scheduled for paracentesis later today.  IMPRESSION: 1. No evidence for pelvic mass. 2. Status post hysterectomy and bilateral salpingo-oophorectomy. 3. Small amount of free pelvic fluid.   Electronically Signed   By: Shon Hale M.D.   On: 11/28/2013 09:01   US  Pelvis Complete  11/28/2013   CLINICAL DATA:  History of ovarian cancer. Pelvic pain and pressure. Prior hysterectomy and bilateral oophorectomy.  EXAM: TRANSABDOMINAL AND TRANSVAGINAL ULTRASOUND OF PELVIS  TECHNIQUE: Both transabdominal and transvaginal ultrasound examinations of the pelvis were performed. Transabdominal technique was performed for global imaging of the pelvis including uterus, ovaries, adnexal regions, and pelvic cul-de-sac. It was necessary to proceed with endovaginal exam following the transabdominal exam to visualize the adnexal regions.  COMPARISON:  07/04/2013  FINDINGS: Uterus  Measurements: The uterus is surgically absent. The vaginal cuff has a normal appearance.  Endometrium  Thickness: The uterus is surgically absent.  Right ovary  Measurements: Status post oophorectomy. No adnexal mass  Left ovary  Measurements: Status post oophorectomy. No adnexal mass  Other findings  A small amount of free pelvic fluid is identified. The patient thinks she is scheduled for paracentesis later today.  IMPRESSION: 1. No evidence for pelvic mass. 2. Status post hysterectomy and bilateral salpingo-oophorectomy. 3. Small amount of free pelvic fluid.   Electronically Signed   By: Shon Hale M.D.   On: 11/28/2013 09:01   Ct Abdomen Pelvis W Contrast  11/28/2013   CLINICAL DATA:  Nausea and vomiting abdominal pain  EXAM: CT ABDOMEN AND PELVIS WITH CONTRAST  TECHNIQUE: Multidetector CT imaging of the abdomen and pelvis was performed using the standard protocol following bolus administration of intravenous contrast.  CONTRAST:  20mL OMNIPAQUE IOHEXOL 300 MG/ML SOLN, 159mL OMNIPAQUE IOHEXOL 300 MG/ML SOLN  COMPARISON:  10/03/2013  FINDINGS: The lung bases are free of acute infiltrate or sizable effusion.  The liver is diffusely decreased in attenuation consistent with fatty infiltration. The gallbladder, spleen, adrenal glands and pancreas are within normal limits. The kidneys are well visualized  bilaterally. 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 Parapelvic cysts are seen. No renal calculi or urinary tract obstructive changes are noted.  Bladder is incompletely distended. There has been prior hysterectomy and oophorectomy. There are multiple dilated loops of small bowel identified with a transition zone deep within the pelvis in the distal ileum. There is considerable amount of inflammatory change and thickening of the mesenteric surrounding the transition zone. 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 A short segment of distal terminal ileum is noted prior to the ascending colon. Small amount of ascites is seen. No acute bony abnormality is noted.  IMPRESSION: Changes consistent with small bowel obstruction in the distal ileum just before the terminal ileum. A considerable amount of mesenteric thickening is noted surrounding this region. These changes were present on the prior PET-CT.  Chronic changes as described above.   Electronically Signed  By: Inez Catalina M.D.   On: 11/28/2013 14:22    Anti-infectives: Anti-infectives   None       Assessment/Plan  1. SBO, ? Malignant vs adhesive in origin  2. Ovarian cancer with carcinomatosis  Plan: 1. Repeat abdominal films this morning and see progression if any.  Continue NGT for now.  We will treat the patient like any other bowel obstruction at first with conservative management; however, if they patient does not resolve, then we would recommend GI or IR consult for PEG tube placement for palliative management.  Hopefully, she will resolve without requiring this for now.  We will follow along and await today's films.  LOS: 1 day    Mick Tanguma E 11/29/2013, 8:16 AM Pager: [phone]

## 2013-11-29 NOTE — Progress Notes (Signed)
INITIAL NUTRITION ASSESSMENT  DOCUMENTATION CODES Per approved criteria  -Severe malnutrition in the context of chronic illness  Pt meets criteria for severe MALNUTRITION in the context of chronic illness as evidenced by PO intake < 75% for > one month, 5.3% body weight loss in one month.   INTERVENTION: -Diet advancement per MD -Supplementation as warranted/tolerated -RD to continue to monitor  NUTRITION DIAGNOSIS: Inadequate oral intake related to inability to eat  as evidenced by NPO status.   Goal: Pt to meet >/= 90% of their estimated nutrition needs    Monitor:  Diet order, GI profile, total protein/energy intake, labs, weights, GOC  Reason for Assessment: MST  73 y.o. female  Admitting Dx: SBO (small bowel obstruction)  ASSESSMENT: Rita Humphrey is a 73 y.o. female, with history of ovarian cancer with mesenteric metastasis who is under the care of Dr. Skeet Latch oncologist, failed first cycle of chemotherapy, admitted recently for SBO, HTN, Dyslipidemia, who started experiencing some abdominal pain which was generalized along with nausea vomiting, inability to pass flatus for the last 2 days  -Pt resting during time of assessment, unable to determine detailed food/nutrition hx. Information gathered from MD notes and previous RD assessments -Pt reported an unintentional wt loss of 10 lbs in past one month (5.3% body weight, severe for time frame). Endorsed having no appetite and minimal intake d/t food tasting bitter, chronic constipation, and abd pain -Has been suffering from poor appetite for > one year per previous RD assessment in 07/2012. Pt was undergoing chemo and suffered from nausea, abd pain, diarrhea, and constipation -Currently NPO d/t SBO and tumor ileus. NGT was placed on 9/24 w/800 ml output. -No BMs or flatus. Pt not candidate for TPN d/t terminal ovarian cancer with mets to [place] for palliative G tube to be placed and home with hospice   Height: Ht  Readings from Last 1 Encounters:  11/28/13 5\' 3"  (1.6 m)    Weight: Wt Readings from Last 1 Encounters:  11/28/13 190 lb 3.2 oz (86.274 kg)    Ideal Body Weight: 115 lbs  % Ideal Body Weight: 165%  Wt Readings from Last 10 Encounters:  11/28/13 190 lb 3.2 oz (86.274 kg)  11/28/13 186 lb 11.2 oz (84.687 kg)  11/19/13 190 lb (86.183 kg)  10/30/13 186 lb 11.2 oz (84.687 kg)  10/30/13 192 lb 8 oz (87.317 kg)  10/22/13 196 lb 9.6 oz (89.177 kg)  09/10/13 194 lb 3.2 oz (88.089 kg)  09/05/13 193 lb 3.2 oz (87.635 kg)  08/23/13 194 lb 3.2 oz (88.089 kg)  08/05/13 194 lb 8 oz (88.225 kg)    Usual Body Weight: 194-196 lb  % Usual Body Weight: 97%  BMI:  Body mass index is 33.7 kg/(m^2).  Estimated Nutritional Needs: Kcal: 1800-2000 Protein: 100-110 Fluid: >/=1800 ml/daily  Skin: WDL  Diet Order: NPO  EDUCATION NEEDS: -No education needs identified at this time   Intake/Output Summary (Last 24 hours) at 11/29/13 1425 Last data filed at 11/29/13 1200  Gross per 24 hour  Intake    300 ml  Output   1250 ml  Net   -950 ml    Last BM: 9/20   Labs:   Recent Labs Lab 11/28/13 1201 11/29/13 0549  NA 139 140  K 3.8 4.1  CL 95* 105  CO2 30 25  BUN 21 13  CREATININE 0.99 0.81  CALCIUM 9.8 8.7  GLUCOSE 121* 100*    CBG (last 3)  No results found for  this basename: GLUCAP,  in the last 72 hours  Scheduled Meds: . bisacodyl  10 mg Rectal Daily  . clotrimazole  1 Applicatorful Vaginal QHS  . heparin  5,000 Units Subcutaneous 3 times per day  . lip balm  1 application Topical BID  . ondansetron  4 mg Intravenous Q6H  . pantoprazole (PROTONIX) IV  40 mg Intravenous Q12H    Continuous Infusions: . dextrose 5 % and 0.45 % NaCl with KCl 20 mEq/L 1,000 mL (11/29/13 0731)    Past Medical History  Diagnosis Date  . Hypertension   . Hyperlipemia   . Thyroid disease   . Asthma   . Adnexal mass 05/16/2012  . 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 Peripheral  neuropathy, secondary to chemotherapy 08/30/2012  . Cancer     [place]  . Breast cancer     s/p bilateral mastectomy [id number]  . Ovarian cancer 05/17/2012  . 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 Peripheral vascular disease 2013    hx of DVT right leg  . History of kidney stones   . Anemia   . Platelets decreased april 2014     1 transfusion given before chemo  . Deafness in left ear   . Numbness in feet   . Numbness of fingers of both hands   . Dizziness     occasional  . History of breast cancer 12/07/2012    Past Surgical History  Procedure Laterality Date  . Thyroid lobectomy    . Foot surgery      2 spurs taken out of foot  . Varicose vein surgery    . Bladder tack surgery     . Tonsillectomy    . Mastectomy      bilateral   . Laparotomy Bilateral 06/05/2012    Procedure: EXPLORATORY LAPAROTOMY WITH PELVIC TUMOR BIOPSY;  Surgeon: Imagene Gurney A. Alycia Rossetti, MD;  Location: [facility];  Service: Gynecology;  Laterality: Bilateral;  . Abdominal hysterectomy  1980  . Vascular surgery      veins stripped from both legs  . Portacath placement Left 06/25/2012    Procedure: INSERTION PORT-A-CATH;  Surgeon: Jamesetta So, MD;  Location: [facility];  Service: General;  Laterality: Left;  . Laparoscopy N/A 10/30/2012    Procedure: LAPAROSCOPY DIAGNOSTIC, EXPLORATORY LAPAROTOMY, BILATERAL SALPINGO-OOPHORECTOMY, TUMOR DEBULKING;  Surgeon: Imagene Gurney A. Alycia Rossetti, MD;  Location: [facility];  Service: Gynecology;  Laterality: N/A;    Atlee Abide MS RD LDN Clinical Dietitian [phone]

## 2013-11-29 NOTE — Progress Notes (Signed)
CONSULT NOTE:    Subjective: Patient reports feeling better after NGT placement. No flatus or BM though. Cramping left lower quadrant pain.    Objective: Vital signs in last 24 hours: Temp:  [97.9 F (36.6 C)-98.7 F (37.1 C)] 98.4 F (36.9 C) (09/25 0510) Pulse Rate:  [80-94] 80 (09/25 0510) Resp:  [16-22] 16 (09/25 0510) BP: (125-159)/(59-79) 131/66 mmHg (09/25 0510) SpO2:  [92 %-98 %] 97 % (09/25 0510) Weight:  [190 lb 3.2 oz (86.274 kg)-192 lb 10.9 oz (87.4 kg)] 190 lb 3.2 oz (86.274 kg) (09/24 2047) Last BM Date: 11/24/13  Intake/Output from previous day: 09/24 0701 - 09/25 0700 In: 60  Out: 450 [Urine:450] NGT output approx 1 L after insertion yesterday Physical Examination:   Labs: WBC/Hgb/Hct/Plts:  8.3/12.8/40.5/310 (09/25 0549) BUN/Cr/glu/ALT/AST/amyl/lip:  13/0.81/--/--/--/--/-- (09/25 0549)  Imaging:  CT abdo/pelvis 11/28/13: There are multiple dilated loops of small bowel  identified with a transition zone deep within the pelvis in the  distal ileum. There is considerable amount of inflammatory change  and thickening of the mesenteric surrounding the transition zone.  Given the patient's clinical history of ovarian carcinoma with  omental metastatic disease, this may represent recurrent disease or  may be related to prior surgery and adhesions. A short segment of  distal terminal ileum is noted prior to the ascending colon. Small  amount of ascites is seen.   Abdominal xray 11/29/13:  Bowel gas pattern now appears unremarkable. Nasogastric tube tip and  side port in stomach. No free air. (improvement with decompression).   Assessment:  73 y.o.  with recurrent progressive platinum resistent ovarian cancer, admitted for recurrent tumor ileus. Pain:  Pain is well-controlled on IV medications.  Heme:no anemia or elevation in WBC   ID: mildly elevated WBC yesterday was likely hemoconcentration. No fevers or signs of infection  CV: stable   GI:   Tolerating po: No: persistent SBO/tumor ileus from unresectable recurrent, chemoresistant ovarian cancer  Pt with small bowel obstruction and tumor ileus.. .  The CT scan was positive for recurrence and obstruction: showed dilated small bowel and tumor infiltration of mesentery of ileum distally. which is likely causing obstruction.. The AXR was negative: no dilated bowel, nonspecific bowel gas pattern and demonstrating improvement in bowel status after NGT decompression.    Treatment intent is palliative, and surgical intervention is neither recommended (it is unusual that patients have viable tumor free bowel to reanastamose) or desired by patient. Given that the natural history of this disease state is anticipated to be progressive, and she is not a candidate for cytotoxic therapy while she is unable to take in nutrition, I am recommending the G tube be placed. I also recommend arrangement of home hospice. I do not recommend TPN nutrition as it has not been associated with prolongation of life in patients with fulminant terminal ovarian cancer with bowel obstruction, and is associated with significant risk and morbidity.  The patient and her daughters have been extensively counseled regarding her underlying prognosis and are supportive of our plans for G tube and hospice care.  FEN: monitor electrolytes and replete prn  Prophylaxis: recommend dual prophylaxis as patient is at high risk for VTE with ovarian malignancy and minimal mobility.  Plan: G tube, home hospice, palliative care consultation regarding optimization of analgesia with limited oral capabilities. Dispo:  Discharge plan to include :consults: Social Work The patient is to be discharged to continued inpatient care.   LOS: 1 day    Rita Humphrey 11/29/2013,  10:14 AM

## 2013-11-29 NOTE — ED Provider Notes (Signed)
Medical screening examination/treatment/procedure(s) were conducted as a shared visit with non-physician practitioner(s) and myself.  I personally evaluated the patient during the encounter  Please see my separate respective documentation pertaining to this patient encounter   Johnna Acosta, MD 11/29/13 [phone]

## 2013-11-29 NOTE — Progress Notes (Signed)
Patient has a K pad, applied to abdomen. Patient stated that it helps with her abdominal pain. Will continue to monitor the patient.- Sandie Ano RN

## 2013-11-29 NOTE — Progress Notes (Signed)
[facility] PROGRESS NOTE  KEELIA GRAYBILL Humphrey:[id number] DOB: 12-14-1940 DOA: 11/28/2013 PCP: Delphina Cahill, MD  Assessment/Plan: Rita Humphrey is a 73 y.o. female, with history of ovarian cancer with mesenteric metastasis who is under the care of Dr. Skeet Latch oncologist, failed first cycle of chemotherapy, admitted recently for SBO, HTN, Dyslipidemia, who started experiencing some abdominal pain which was generalized along with nausea vomiting, inability to pass flatus for the last 2 days, went to see her oncologist Dr. Skeet Latch who obtained an abdominal and pelvic ultrasound along with abdominal CT scan suggestive of SBO, she was then sent to [facility] long ER. General surgery was consulted.  Admitting physician discussed her case with her oncologist Dr. Skeet Latch in detail who told me that her long-term prognosis was poor in any intervention including PEG tube a surgical resection will be for palliative purposes only.  1-. 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 Skeet Latch her long-term prognosis is poor. -general surgery following.  - NG tube for drainage, IV fluids, supportive care.   -if she does not improve with this a reasonable option will be a palliative G-tube placement by IR.  -Discussed with GYN, Dr Denman George. She  recommend placement of G tube, She discussed this with the patient and family.  -Will ask IR to place G tube. Will inform this to surgery.   2. Essential hypertension. Since n.p.o. we'll use IV hydralazine as needed. PRN metoprolol.   3. Dyslipidemia. Hold by mouth medications   4. Metastatic ovarian cancer. Failed for cycle of chemotherapy, long-term prognosis poor, Dr Deno Etienne discussed with oncologist Dr. Skeet Latch. Palliative care was consulted.   5-Vaginal discharge: Will start clotrimazole.   Code Status: DNR.  Family Communication:  Disposition Plan: Remain inpatient.    Consultants:  Surgery.    Procedures:  none  Antibiotics:  none  HPI/Subjective: Relates sore throat. Still with mild abdominal pain.   Objective: Filed Vitals:   11/29/13 0510  BP: 131/66  Pulse: 80  Temp: 98.4 F (36.9 C)  Resp: 16    Intake/Output Summary (Last 24 hours) at 11/29/13 0844 Last data filed at 11/29/13 0818  Gross per 24 hour  Intake    180 ml  Output   1250 ml  Net  -1070 ml   Filed Weights   11/28/13 1800 11/28/13 2047  Weight: 87.4 kg (192 lb 10.9 oz) 86.274 kg (190 lb 3.2 oz)    Exam:   General:  Alert in no distress. NG tube in place.   Cardiovascular: S 1, S 2 RRR  Respiratory: Bilateral ronchus.   Abdomen: Bs present, soft, mild distended.   Musculoskeletal: trace edema.    Data Reviewed: Basic Metabolic Panel:  Recent Labs Lab 11/28/13 1201 11/29/13 0549  NA 139 140  K 3.8 4.1  CL 95* 105  CO2 30 25  GLUCOSE 121* 100*  BUN 21 13  CREATININE 0.99 0.81  CALCIUM 9.8 8.7   Liver Function Tests:  Recent Labs Lab 11/28/13 1201  AST 21  ALT 28  ALKPHOS 95  BILITOT 0.7  PROT 7.6  ALBUMIN 3.8    Recent Labs Lab 11/28/13 1201  LIPASE 13   No results found for this basename: AMMONIA,  in the last 168 hours CBC:  Recent Labs Lab 11/28/13 1201 11/29/13 0549  WBC 10.6* 8.3  NEUTROABS 7.2  --   HGB 14.8 12.8  HCT 46.2* 40.5  MCV 86.7 87.7  PLT 360 310  Cardiac Enzymes: No results found for this basename: CKTOTAL, CKMB, CKMBINDEX, TROPONINI,  in the last 168 hours BNP (last 3 results) No results found for this basename: PROBNP,  in the last 8760 hours CBG: No results found for this basename: GLUCAP,  in the last 168 hours  No results found for this or any previous visit (from the past 240 hour(s)).   Studies: Dg Abd 1 View  11/28/2013   CLINICAL DATA:  Nasogastric tube placement.  EXAM: ABDOMEN - 1 VIEW  COMPARISON:  10/30/2013  FINDINGS: Nasogastric tube tip overlies the level of the stomach. There is dilatation of small  bowel loops, measuring up to 4 cm in diameter. No evidence for free intraperitoneal air. Contrast is identified within the bladder and collecting systems following CT exam.  IMPRESSION: Nasogastric tube tip overlying the level of the stomach.  Small bowel obstruction.   Electronically Signed   By: Shon Hale M.D.   On: 11/28/2013 17:11   US Transvaginal Non-ob  11/28/2013   CLINICAL DATA:  History of ovarian cancer. Pelvic pain and pressure. Prior hysterectomy and bilateral oophorectomy.  EXAM: TRANSABDOMINAL AND TRANSVAGINAL ULTRASOUND OF PELVIS  TECHNIQUE: Both transabdominal and transvaginal ultrasound examinations of the pelvis were performed. Transabdominal technique was performed for global imaging of the pelvis including uterus, ovaries, adnexal regions, and pelvic cul-de-sac. It was necessary to proceed with endovaginal exam following the transabdominal exam to visualize the adnexal regions.  COMPARISON:  07/04/2013  FINDINGS: Uterus  Measurements: The uterus is surgically absent. The vaginal cuff has a normal appearance.  Endometrium  Thickness: The uterus is surgically absent.  Right ovary  Measurements: Status post oophorectomy. No adnexal mass  Left ovary  Measurements: Status post oophorectomy. No adnexal mass  Other findings  A small amount of free pelvic fluid is identified. The patient thinks she is scheduled for paracentesis later today.  IMPRESSION: 1. No evidence for pelvic mass. 2. Status post hysterectomy and bilateral salpingo-oophorectomy. 3. Small amount of free pelvic fluid.   Electronically Signed   By: Shon Hale M.D.   On: 11/28/2013 09:01   US Pelvis Complete  11/28/2013   CLINICAL DATA:  History of ovarian cancer. Pelvic pain and pressure. Prior hysterectomy and bilateral oophorectomy.  EXAM: TRANSABDOMINAL AND TRANSVAGINAL ULTRASOUND OF PELVIS  TECHNIQUE: Both transabdominal and transvaginal ultrasound examinations of the pelvis were performed. Transabdominal technique was  performed for global imaging of the pelvis including uterus, ovaries, adnexal regions, and pelvic cul-de-sac. It was necessary to proceed with endovaginal exam following the transabdominal exam to visualize the adnexal regions.  COMPARISON:  07/04/2013  FINDINGS: Uterus  Measurements: The uterus is surgically absent. The vaginal cuff has a normal appearance.  Endometrium  Thickness: The uterus is surgically absent.  Right ovary  Measurements: Status post oophorectomy. No adnexal mass  Left ovary  Measurements: Status post oophorectomy. No adnexal mass  Other findings  A small amount of free pelvic fluid is identified. The patient thinks she is scheduled for paracentesis later today.  IMPRESSION: 1. No evidence for pelvic mass. 2. Status post hysterectomy and bilateral salpingo-oophorectomy. 3. Small amount of free pelvic fluid.   Electronically Signed   By: Shon Hale M.D.   On: 11/28/2013 09:01   Ct Abdomen Pelvis W Contrast  11/28/2013   CLINICAL DATA:  Nausea and vomiting abdominal pain  EXAM: CT ABDOMEN AND PELVIS WITH CONTRAST  TECHNIQUE: Multidetector CT imaging of the abdomen and pelvis was performed using the standard protocol following bolus  administration of intravenous contrast.  CONTRAST:  64mL OMNIPAQUE IOHEXOL 300 MG/ML SOLN, 162mL OMNIPAQUE IOHEXOL 300 MG/ML SOLN  COMPARISON:  10/03/2013  FINDINGS: The lung bases are free of acute infiltrate or sizable effusion.  The liver is diffusely decreased in attenuation consistent with fatty infiltration. The gallbladder, spleen, adrenal glands and pancreas are within normal limits. The kidneys are well visualized bilaterally. 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 Parapelvic cysts are seen. No renal calculi or urinary tract obstructive changes are noted.  Bladder is incompletely distended. There has been prior hysterectomy and oophorectomy. There are multiple dilated loops of small bowel identified with a  transition zone deep within the pelvis in the distal ileum. There is considerable amount of inflammatory change and thickening of the mesenteric surrounding the transition zone. 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 A short segment of distal terminal ileum is noted prior to the ascending colon. Small amount of ascites is seen. No acute bony abnormality is noted.  IMPRESSION: Changes consistent with small bowel obstruction in the distal ileum just before the terminal ileum. A considerable amount of mesenteric thickening is noted surrounding this region. These changes were present on the prior PET-CT.  Chronic changes as described above.   Electronically Signed   By: Inez Catalina M.D.   On: 11/28/2013 14:22   Dg Abd 2 Views  11/29/2013   CLINICAL DATA:  Bowel obstruction  EXAM: ABDOMEN - 2 VIEW  COMPARISON:  CT abdomen and pelvis November 28, 2013; abdominal radiograph November 28, 2013  FINDINGS: Supine and upright images were obtained. Nasogastric tube tip and side port are in the stomach. There is no longer bowel dilatation. No air-fluid levels. Contrast is present in the colon. No free air is seen. No bone lesions are appreciable. In there are phleboliths in the pelvis.  IMPRESSION: Bowel gas pattern now appears unremarkable. Nasogastric tube tip and side port in stomach. No free air.   Electronically Signed   By: Lowella Grip M.D.   On: 11/29/2013 08:37    Scheduled Meds: . bisacodyl  10 mg Rectal Daily  . heparin  5,000 Units Subcutaneous 3 times per day  . lip balm  1 application Topical BID  . ondansetron  4 mg Intravenous Q6H   Continuous Infusions: . dextrose 5 % and 0.45 % NaCl with KCl 20 mEq/L 1,000 mL (11/29/13 0731)    Principal Problem:   SBO (small bowel obstruction) Active Problems:   HTN (hypertension)   Ovarian cancer   Omental  metastasis   Peripheral neuropathy, secondary to chemotherapy   History of breast cancer   GERD without esophagitis   Nausea with vomiting    Time spent: 35 minutes.    Niel Hummer A  [facility] Pager [phone]. If 7PM-7AM, please contact night-coverage at [email or web address], password [facility] 11/29/2013, 8:44 AM  LOS: 1 day

## 2013-11-30 LAB — BASIC METABOLIC PANEL
Anion gap: 10 (ref 5–15)
BUN: 9 mg/dL (ref 6–23)
CALCIUM: 9.1 mg/dL (ref 8.4–10.5)
CO2: 26 mEq/L (ref 19–32)
Chloride: 103 mEq/L (ref 96–112)
Creatinine, Ser: 0.75 mg/dL (ref 0.50–1.10)
GFR calc Af Amer: >90 mL/min (ref >90)
GFR, EST NON AFRICAN AMERICAN: 82 mL/min — AB (ref >90)
GLUCOSE: 141 mg/dL — AB (ref 70–99)
Potassium: 4.7 mEq/L (ref 3.7–5.3)
Sodium: 139 mEq/L (ref 137–147)

## 2013-11-30 LAB — CBC
HEMATOCRIT: 42.2 % (ref 36.0–46.0)
Hemoglobin: 13.4 g/dL (ref 12.0–15.0)
MCH: 27.9 pg (ref 26.0–34.0)
MCHC: 31.8 g/dL (ref 30.0–36.0)
MCV: 87.7 fL (ref 78.0–100.0)
Platelets: 317 10*3/uL (ref 150–400)
RBC: 4.81 MIL/uL (ref 3.87–5.11)
RDW: 15 % (ref 11.5–15.5)
WBC: 7.7 10*3/uL (ref 4.0–10.5)

## 2013-11-30 NOTE — Progress Notes (Signed)
[facility] PROGRESS NOTE  [name] WGN:[id number] DOB: June 21, 1940 DOA: 11/28/2013 PCP: Delphina Cahill, MD  Assessment/Plan: Rita Humphrey is a 73 y.o. female, with history of ovarian cancer with mesenteric metastasis who is under the care of Dr. Skeet Latch oncologist, failed first cycle of chemotherapy, admitted recently for SBO, HTN, Dyslipidemia, who started experiencing some abdominal pain which was generalized along with nausea vomiting, inability to pass flatus for the last 2 days, went to see her oncologist Dr. Skeet Latch who obtained an abdominal and pelvic ultrasound along with abdominal CT scan suggestive of SBO, she was then sent to [facility] long ER. General surgery was consulted.  Admitting physician discussed her case with her oncologist Dr. Skeet Latch in detail who told me that her long-term prognosis was poor in any intervention including PEG tube a surgical resection will be for palliative purposes only.  1-. 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 Skeet Latch her long-term prognosis is poor. -general surgery following.  - NG tube for drainage, IV fluids, supportive care.  -Discussed with GYN, Dr Denman George. She  recommend placement of G tube, She discussed this with the patient and family.  -order  IR to place G tube.   2. Essential hypertension. Since n.p.o. we'll use IV hydralazine as needed. PRN metoprolol.   3. Dyslipidemia. Hold by mouth medications   4. Metastatic ovarian cancer. Failed for cycle of chemotherapy, long-term prognosis poor, Dr Deno Etienne discussed with oncologist Dr. Skeet Latch. Appreciate Palliative care recommendations.   5-Vaginal discharge: continue with  clotrimazole.   Code Status: DNR.  Family Communication: care discussed with daughter who was at bedside.  Disposition Plan: Remain inpatient.    Consultants:  Surgery.   Procedures:  none  Antibiotics:  none  HPI/Subjective: Passing gas, no BM  today. Still with abdominal pain.   Objective: Filed Vitals:   11/30/13 0553  BP: 143/73  Pulse: 87  Temp: 98 F (36.7 C)  Resp: 19    Intake/Output Summary (Last 24 hours) at 11/30/13 1026 Last data filed at 11/30/13 0500  Gross per 24 hour  Intake    800 ml  Output   1431 ml  Net   -631 ml   Filed Weights   11/28/13 1800 11/28/13 2047 11/30/13 0553  Weight: 87.4 kg (192 lb 10.9 oz) 86.274 kg (190 lb 3.2 oz) 87.4 kg (192 lb 10.9 oz)    Exam:   General:  Alert in no distress. NG tube in place.   Cardiovascular: S 1, S 2 RRR  Respiratory: Bilateral ronchus.   Abdomen: Bs present, soft, mild distended.   Musculoskeletal: trace edema.    Data Reviewed: Basic Metabolic Panel:  Recent Labs Lab 11/28/13 1201 11/29/13 0549 11/30/13 0543  NA 139 140 139  K 3.8 4.1 4.7  CL 95* 105 103  CO2 30 25 26   GLUCOSE 121* 100* 141*  BUN 21 13 9   CREATININE 0.99 0.81 0.75  CALCIUM 9.8 8.7 9.1   Liver Function Tests:  Recent Labs Lab 11/28/13 1201  AST 21  ALT 28  ALKPHOS 95  BILITOT 0.7  PROT 7.6  ALBUMIN 3.8    Recent Labs Lab 11/28/13 1201  LIPASE 13   No results found for this basename: AMMONIA,  in the last 168 hours CBC:  Recent Labs Lab 11/28/13 1201 11/29/13 0549 11/30/13 0543  WBC 10.6* 8.3 7.7  NEUTROABS 7.2  --   --   HGB 14.8 12.8 13.4  HCT  46.2* 40.5 42.2  MCV 86.7 87.7 87.7  PLT 360 310 317   Cardiac Enzymes: No results found for this basename: CKTOTAL, CKMB, CKMBINDEX, TROPONINI,  in the last 168 hours BNP (last 3 results) No results found for this basename: PROBNP,  in the last 8760 hours CBG: No results found for this basename: GLUCAP,  in the last 168 hours  No results found for this or any previous visit (from the past 240 hour(s)).   Studies: Dg Abd 1 View  11/28/2013   CLINICAL DATA:  Nasogastric tube placement.  EXAM: ABDOMEN - 1 VIEW  COMPARISON:  10/30/2013  FINDINGS: Nasogastric tube tip overlies the level of the  stomach. There is dilatation of small bowel loops, measuring up to 4 cm in diameter. No evidence for free intraperitoneal air. Contrast is identified within the bladder and collecting systems following CT exam.  IMPRESSION: Nasogastric tube tip overlying the level of the stomach.  Small bowel obstruction.   Electronically Signed   By: Shon Hale M.D.   On: 11/28/2013 17:11   Ct Abdomen Pelvis W Contrast  11/28/2013   CLINICAL DATA:  Nausea and vomiting abdominal pain  EXAM: CT ABDOMEN AND PELVIS WITH CONTRAST  TECHNIQUE: Multidetector CT imaging of the abdomen and pelvis was performed using the standard protocol following bolus administration of intravenous contrast.  CONTRAST:  50mL OMNIPAQUE IOHEXOL 300 MG/ML SOLN, 145mL OMNIPAQUE IOHEXOL 300 MG/ML SOLN  COMPARISON:  10/03/2013  FINDINGS: The lung bases are free of acute infiltrate or sizable effusion.  The liver is diffusely decreased in attenuation consistent with fatty infiltration. The gallbladder, spleen, adrenal glands and pancreas are within normal limits. The kidneys are well visualized bilaterally. 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 Parapelvic cysts are seen. No renal calculi or urinary tract obstructive changes are noted.  Bladder is incompletely distended. There has been prior hysterectomy and oophorectomy. There are multiple dilated loops of small bowel identified with a transition zone deep within the pelvis in the distal ileum. There is considerable amount of inflammatory change and thickening of the mesenteric surrounding the transition zone. 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 A short segment of distal terminal ileum is noted prior to the ascending colon. Small amount of ascites is seen. No acute bony abnormality is noted.  IMPRESSION: Changes consistent with  small bowel obstruction in the distal ileum just before the terminal ileum. A considerable amount of mesenteric thickening is noted surrounding this region. These changes were present on the prior PET-CT.  Chronic changes as described above.   Electronically Signed   By: Inez Catalina M.D.   On: 11/28/2013 14:22   Dg Abd 2 Views  11/29/2013   CLINICAL DATA:  Bowel obstruction  EXAM: ABDOMEN - 2 VIEW  COMPARISON:  CT abdomen and pelvis November 28, 2013; abdominal radiograph November 28, 2013  FINDINGS: Supine and upright images were obtained. Nasogastric tube tip and side port are in the stomach. There is no longer bowel dilatation. No air-fluid levels. Contrast is present in the colon. No free air is seen. No bone lesions are appreciable. In there are phleboliths in the pelvis.  IMPRESSION: Bowel gas pattern now appears unremarkable. Nasogastric tube tip and side port in stomach. No free air.   Electronically Signed   By: Lowella Grip M.D.   On: 11/29/2013 08:37  Scheduled Meds: . bisacodyl  10 mg Rectal Daily  . clotrimazole  1 Applicatorful Vaginal QHS  . dexamethasone  4 mg Intravenous Q12H  . heparin  5,000 Units Subcutaneous 3 times per day  . lip balm  1 application Topical BID  . ondansetron  4 mg Intravenous Q6H  . pantoprazole (PROTONIX) IV  40 mg Intravenous Q12H   Continuous Infusions: . dextrose 5 % and 0.45 % NaCl with KCl 20 mEq/L 75 mL/hr at 11/30/13 8250    Principal Problem:   SBO (small bowel obstruction) Active Problems:   HTN (hypertension)   Ovarian cancer   Omental metastasis   Peripheral neuropathy, secondary to chemotherapy   History of breast cancer   GERD without esophagitis   Nausea with vomiting   Protein-calorie malnutrition, severe   Palliative care encounter    Time spent: 25 minutes.    Niel Hummer A  [facility] Pager [phone]. If 7PM-7AM, please contact night-coverage at [email or web address], password [facility] 11/30/2013, 10:26 AM   LOS: 2 days

## 2013-12-01 ENCOUNTER — Encounter (HOSPITAL_COMMUNITY): Payer: Self-pay | Admitting: Radiology

## 2013-12-01 DIAGNOSIS — D72829 Elevated white blood cell count, unspecified: Secondary | ICD-10-CM

## 2013-12-01 LAB — BASIC METABOLIC PANEL
ANION GAP: 13 (ref 5–15)
BUN: 11 mg/dL (ref 6–23)
CALCIUM: 9.4 mg/dL (ref 8.4–10.5)
CO2: 25 mEq/L (ref 19–32)
CREATININE: 0.75 mg/dL (ref 0.50–1.10)
Chloride: 102 mEq/L (ref 96–112)
GFR, EST NON AFRICAN AMERICAN: 82 mL/min — AB (ref >90)
Glucose, Bld: 125 mg/dL — ABNORMAL HIGH (ref 70–99)
Potassium: 4.4 mEq/L (ref 3.7–5.3)
SODIUM: 140 meq/L (ref 137–147)

## 2013-12-01 LAB — CBC
HCT: 42 % (ref 36.0–46.0)
Hemoglobin: 13.5 g/dL (ref 12.0–15.0)
MCH: 27.6 pg (ref 26.0–34.0)
MCHC: 32.1 g/dL (ref 30.0–36.0)
MCV: 85.9 fL (ref 78.0–100.0)
PLATELETS: 334 10*3/uL (ref 150–400)
RBC: 4.89 MIL/uL (ref 3.87–5.11)
RDW: 14.7 % (ref 11.5–15.5)
WBC: 14.1 10*3/uL — ABNORMAL HIGH (ref 4.0–10.5)

## 2013-12-01 MED ORDER — VANCOMYCIN HCL IN DEXTROSE 1-5 GM/200ML-% IV SOLN
1000.0000 mg | INTRAVENOUS | Status: AC
  Administered 2013-12-02: 1000 mg via INTRAVENOUS
  Filled 2013-12-01 (×2): qty 200

## 2013-12-01 MED ORDER — KCL IN DEXTROSE-NACL 20-5-0.45 MEQ/L-%-% IV SOLN
INTRAVENOUS | Status: DC
  Administered 2013-12-01 – 2013-12-03 (×2): via INTRAVENOUS
  Filled 2013-12-01 (×4): qty 1000

## 2013-12-01 MED ORDER — POTASSIUM CHLORIDE 2 MEQ/ML IV SOLN: INTRAVENOUS | Status: DC

## 2013-12-01 NOTE — Progress Notes (Signed)
[facility] PROGRESS NOTE  Rita Humphrey EXH:[id number] DOB: 22-Oct-1940 DOA: 11/28/2013 PCP: Delphina Cahill, MD  Assessment/Plan: Rita Humphrey is a 73 y.o. female, with history of ovarian cancer with mesenteric metastasis who is under the care of Dr. Skeet Latch oncologist, failed first cycle of chemotherapy, admitted recently for SBO, HTN, Dyslipidemia, who started experiencing some abdominal pain which was generalized along with nausea vomiting, inability to pass flatus for the last 2 days, went to see her oncologist Dr. Skeet Latch who obtained an abdominal and pelvic ultrasound along with abdominal CT scan suggestive of SBO, she was then sent to [facility] long ER. General surgery was consulted.  Admitting physician discussed her case with patient 's  oncologist Dr. Skeet Latch in detail who think patient  long-term prognosis was poor in any intervention including PEG tube a surgical resection will be for palliative purposes only.  1-. 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 Skeet Latch her long-term prognosis is poor. -general surgery following.  - NG tube in place, IV fluids, supportive care.  -Discussed with GYN, Dr Denman George. She  recommend placement of G tube, She discussed this with the patient and family.  -G tube to be place by IR on 9-28. Might be able to start clear diet after that.  -Patient had BM 9-27.   2. Essential hypertension. Since n.p.o. we'll use IV hydralazine as needed. PRN metoprolol.   3. Dyslipidemia. Hold by mouth medications   4. Metastatic ovarian cancer. Failed for cycle of chemotherapy, long-term prognosis poor, Dr Deno Etienne discussed with oncologist Dr. Skeet Latch. Appreciate Palliative care recommendations. See palliative care note 9-25 for pain management at time of discharge.   5-Vaginal discharge: continue with  clotrimazole.   6-Leukocytosis: Probably related to Decadron. Decadron started by palliative care team.    Code Status: DNR.  Family Communication: care discussed with daughter who was at bedside.  Disposition Plan: Remain inpatient.    Consultants:  Surgery.   Procedures:  none  Antibiotics:  none  HPI/Subjective: Abdominal pain better. Has discomfort throat from NG tube.  Passing gas. Had small BM early today.  She wants to eat.   Objective: Filed Vitals:   12/01/13 0500  BP: 129/59  Pulse: 84  Temp: 98.1 F (36.7 C)  Resp: 16    Intake/Output Summary (Last 24 hours) at 12/01/13 1234 Last data filed at 12/01/13 1128  Gross per 24 hour  Intake    600 ml  Output   1500 ml  Net   -900 ml   Filed Weights   11/28/13 2047 11/30/13 0553 12/01/13 0500  Weight: 86.274 kg (190 lb 3.2 oz) 87.4 kg (192 lb 10.9 oz) 85.73 kg (189 lb)    Exam:   General:  Alert in no distress. NG tube in place.   Cardiovascular: S 1, S 2 RRR  Respiratory: Bilateral ronchus.   Abdomen: Bs present, soft, mild distended.   Musculoskeletal: trace edema.    Data Reviewed: Basic Metabolic Panel:  Recent Labs Lab 11/28/13 1201 11/29/13 0549 11/30/13 0543 12/01/13 0530  NA 139 140 139 140  K 3.8 4.1 4.7 4.4  CL 95* 105 103 102  CO2 30 25 26 25   GLUCOSE 121* 100* 141* 125*  BUN 21 13 9 11   CREATININE 0.99 0.81 0.75 0.75  CALCIUM 9.8 8.7 9.1 9.4   Liver Function Tests:  Recent Labs Lab 11/28/13 1201  AST 21  ALT 28  ALKPHOS 95  BILITOT 0.7  PROT 7.6  ALBUMIN 3.8    Recent Labs Lab 11/28/13 1201  LIPASE 13   No results found for this basename: AMMONIA,  in the last 168 hours CBC:  Recent Labs Lab 11/28/13 1201 11/29/13 0549 11/30/13 0543 12/01/13 0530  WBC 10.6* 8.3 7.7 14.1*  NEUTROABS 7.2  --   --   --   HGB 14.8 12.8 13.4 13.5  HCT 46.2* 40.5 42.2 42.0  MCV 86.7 87.7 87.7 85.9  PLT 360 310 317 334   Cardiac Enzymes: No results found for this basename: CKTOTAL, CKMB, CKMBINDEX, TROPONINI,  in the last 168 hours BNP (last 3 results) No results  found for this basename: PROBNP,  in the last 8760 hours CBG: No results found for this basename: GLUCAP,  in the last 168 hours  No results found for this or any previous visit (from the past 240 hour(s)).   Studies: No results found.  Scheduled Meds: . bisacodyl  10 mg Rectal Daily  . clotrimazole  1 Applicatorful Vaginal QHS  . dexamethasone  4 mg Intravenous Q12H  . heparin  5,000 Units Subcutaneous 3 times per day  . lip balm  1 application Topical BID  . ondansetron  4 mg Intravenous Q6H  . pantoprazole (PROTONIX) IV  40 mg Intravenous Q12H  . [START ON 12/02/2013] vancomycin  1,000 mg Intravenous On Call   Continuous Infusions: . dextrose 5 % and 0.45 % NaCl with KCl 20 mEq/L 50 mL/hr at 12/01/13 1120    Principal Problem:   SBO (small bowel obstruction) Active Problems:   HTN (hypertension)   Ovarian cancer   Omental metastasis   Peripheral neuropathy, secondary to chemotherapy   History of breast cancer   GERD without esophagitis   Nausea with vomiting   Protein-calorie malnutrition, severe   Palliative care encounter    Time spent: 25 minutes.    Niel Hummer A  [facility] Pager [phone]. If 7PM-7AM, please contact night-coverage at [email or web address], password [email or web address] 12/01/2013, 12:34 PM  LOS: 3 days

## 2013-12-01 NOTE — Consult Note (Signed)
Referring Physician(s): Dr. Tyrell Antonio, Dr. Skeet Latch  Subjective:  73 yo female with metastatic ovarian cancer and SBO secondary to mets. Pt currently has NGT for decompression and IR is requested to place palliative gastrostomy tube. Chart, PMHx, meds, labs reviewed. Imaging reviewed by Dr. Earleen Newport, who feels pt is acceptable candidate for attempt at perc G-tube placement.  Past Medical History  Diagnosis Date  . Hypertension   . Hyperlipemia   . Thyroid disease   . Asthma   . Adnexal mass 05/16/2012  . 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 Cancer     [place]  . Breast cancer     s/p bilateral mastectomy [id number]  . Ovarian cancer 05/17/2012  . 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 Peripheral vascular disease 2013    hx of DVT right leg  . History of kidney stones   . Anemia   . Platelets decreased april 2014     1 transfusion given before chemo  . Deafness in left ear   . Numbness in feet   . Numbness of fingers of both hands   . Dizziness     occasional  . History of breast cancer 12/07/2012   Past Surgical History  Procedure Laterality Date  . Thyroid lobectomy    . Foot surgery      2 spurs taken out of foot  . Varicose vein surgery    . Bladder tack surgery     . Tonsillectomy    . Mastectomy      bilateral   . Laparotomy Bilateral 06/05/2012    Procedure: EXPLORATORY LAPAROTOMY WITH PELVIC TUMOR BIOPSY;  Surgeon: Imagene Gurney A. Alycia Rossetti, MD;  Location: [facility];  Service: Gynecology;  Laterality: Bilateral;  . Abdominal hysterectomy  1980  . Vascular surgery      veins stripped from both legs  . Portacath placement Left 06/25/2012    Procedure: INSERTION PORT-A-CATH;  Surgeon: Jamesetta So, MD;  Location: [facility];  Service: General;  Laterality: Left;  . Laparoscopy N/A 10/30/2012    Procedure: LAPAROSCOPY DIAGNOSTIC, EXPLORATORY LAPAROTOMY, BILATERAL SALPINGO-OOPHORECTOMY, TUMOR DEBULKING;  Surgeon: Imagene Gurney A. Alycia Rossetti,  MD;  Location: [facility];  Service: Gynecology;  Laterality: N/A;   History   Social History  . Marital Status: Widowed    Spouse Name: N/A    Number of Children: N/A  . Years of Education: N/A   Occupational History  . Not on file.   Social History Main Topics  . Smoking status: Never Smoker   . Smokeless tobacco: Never Used  . Alcohol Use: No  . Drug Use: No  . Sexual Activity: No   Other Topics Concern  . Not on file   Social History Narrative  . No narrative on file     Allergies: Gabapentin; Penicillins; Carboplatin; and Morphine and related  Medications: Current facility-administered medications:acetaminophen (TYLENOL) suppository 650 mg, 650 mg, Rectal, Q6H PRN, Belkys A Regalado, MD;  alum & mag hydroxide-simeth (MAALOX/MYLANTA) 200-200-20 MG/5ML suspension 30 mL, 30 mL, Oral, Q6H PRN, Michael Boston, MD;  bisacodyl (DULCOLAX) suppository 10 mg, 10 mg, Rectal, Daily, Michael Boston, MD, 10 mg at 11/30/13 1559 clotrimazole (GYNE-LOTRIMIN) vaginal cream 1 Applicatorful, 1 Applicatorful, Vaginal, QHS, Belkys A Regalado, MD, 1 Applicatorful at 18/84/16 2139;  dexamethasone (DECADRON) injection 4 mg, 4 mg, Intravenous, S06T, Pershing Proud, NP, 4 mg at 11/30/13 2139;  dextrose 5 % and 0.45 % NaCl with KCl 20 mEq/L infusion, ,  Intravenous, Continuous, Belkys A Regalado, MD diphenhydrAMINE (BENADRYL) injection 12.5-25 mg, 12.5-25 mg, Intravenous, Q6H PRN, Michael Boston, MD, 12.5 mg at 12/01/13 0156;  fentaNYL (SUBLIMAZE) injection 50 mcg, 50 mcg, Intravenous, Q2H PRN, Thurnell Lose, MD, 50 mcg at 11/30/13 1838;  guaiFENesin-dextromethorphan (ROBITUSSIN DM) 100-10 MG/5ML syrup 5 mL, 5 mL, Oral, Q4H PRN, Thurnell Lose, MD heparin injection 5,000 Units, 5,000 Units, Subcutaneous, 3 times per day, Thurnell Lose, MD, 5,000 Units at 12/01/13 0533;  hydrALAZINE (APRESOLINE) injection 10 mg, 10 mg, Intravenous, Q6H PRN, Thurnell Lose, MD;  lidocaine-prilocaine (EMLA) cream, ,  Topical, PRN, Thurnell Lose, MD;  lip balm (CARMEX) ointment 1 application, 1 application, Topical, BID, Michael Boston, MD, 1 application at 53/29/92 2139 magic mouthwash, 15 mL, Oral, QID PRN, Michael Boston, MD;  menthol-cetylpyridinium (CEPACOL) lozenge 3 mg, 1 lozenge, Oral, PRN, Jeryl Columbia, NP, 3 mg at 11/30/13 0859;  metoprolol (LOPRESSOR) injection 5 mg, 5 mg, Intravenous, Q6H PRN, Michael Boston, MD;  ondansetron (ZOFRAN) 8 mg/NS 50 ml IVPB, 8 mg, Intravenous, Q6H PRN, Michael Boston, MD;  ondansetron [facility]) injection 4 mg, 4 mg, Intravenous, Q6H PRN, Michael Boston, MD ondansetron [facility]) injection 4 mg, 4 mg, Intravenous, Q6H, Michael Boston, MD, 4 mg at 12/01/13 0717;  pantoprazole (PROTONIX) injection 40 mg, 40 mg, Intravenous, Q12H, Belkys A Regalado, MD, 40 mg at 11/30/13 2139;  phenol (CHLORASEPTIC) mouth spray 1 spray, 1 spray, Mouth/Throat, PRN, Jeryl Columbia, NP, 1 spray at 11/29/13 0137;  polyethylene glycol (MIRALAX / GLYCOLAX) packet 17 g, 17 g, Oral, Daily PRN, Thurnell Lose, MD promethazine (PHENERGAN) injection 6.25-12.5 mg, 6.25-12.5 mg, Intravenous, Q4H PRN, Michael Boston, MD Facility-Administered Medications Ordered in Other Encounters: sodium chloride 0.9 % injection 10 mL, 10 mL, Intravenous, PRN, Farrel Gobble, MD, 10 mL at 08/05/13 1120;  sodium chloride 0.9 % injection 10 mL, 10 mL, Intravenous, PRN, Farrel Gobble, MD, 10 mL at 10/22/13 1400   Review of Systems  All other systems reviewed and are negative.    Vital Signs: BP 129/59  Pulse 84  Temp(Src) 98.1 F (36.7 C) (Oral)  Resp 16  Ht _0  (1.6 m)  Wt 189 lb (85.73 kg)  BMI 33.49 kg/m2  SpO2 94%  Physical Exam  Constitutional: She is oriented to person, place, and time. She appears well-developed. No distress.  HENT:  Head: Normocephalic and atraumatic.  NGT present  Neck: Normal range of motion. No tracheal deviation present.  Cardiovascular: Normal rate, regular rhythm and normal  heart sounds.   Pulmonary/Chest: Effort normal and breath sounds normal. No respiratory distress.  Abdominal: Soft. There is no tenderness.  Neurological: She is alert and oriented to person, place, and time. No cranial nerve deficit.  Psychiatric: She has a normal mood and affect. Her behavior is normal. Judgment and thought content normal.    Imaging: Dg Abd 1 View  11/28/2013   CLINICAL DATA:  Nasogastric tube placement.  EXAM: ABDOMEN - 1 VIEW  COMPARISON:  10/30/2013  FINDINGS: Nasogastric tube tip overlies the level of the stomach. There is dilatation of small bowel loops, measuring up to 4 cm in diameter. No evidence for free intraperitoneal air. Contrast is identified within the bladder and collecting systems following CT exam.  IMPRESSION: Nasogastric tube tip overlying the level of the stomach.  Small bowel obstruction.   Electronically Signed   By: Shon Hale M.D.   On: 11/28/2013 17:11   US Transvaginal Non-ob  11/28/2013   CLINICAL DATA:  History of ovarian cancer. Pelvic pain and pressure. Prior hysterectomy and bilateral oophorectomy.  EXAM: TRANSABDOMINAL AND TRANSVAGINAL ULTRASOUND OF PELVIS  TECHNIQUE: Both transabdominal and transvaginal ultrasound examinations of the pelvis were performed. Transabdominal technique was performed for global imaging of the pelvis including uterus, ovaries, adnexal regions, and pelvic cul-de-sac. It was necessary to proceed with endovaginal exam following the transabdominal exam to visualize the adnexal regions.  COMPARISON:  07/04/2013  FINDINGS: Uterus  Measurements: The uterus is surgically absent. The vaginal cuff has a normal appearance.  Endometrium  Thickness: The uterus is surgically absent.  Right ovary  Measurements: Status post oophorectomy. No adnexal mass  Left ovary  Measurements: Status post oophorectomy. No adnexal mass  Other findings  A small amount of free pelvic fluid is identified. The patient thinks she is scheduled for paracentesis  later today.  IMPRESSION: 1. No evidence for pelvic mass. 2. Status post hysterectomy and bilateral salpingo-oophorectomy. 3. Small amount of free pelvic fluid.   Electronically Signed   By: Shon Hale M.D.   On: 11/28/2013 09:01   US Pelvis Complete  11/28/2013   CLINICAL DATA:  History of ovarian cancer. Pelvic pain and pressure. Prior hysterectomy and bilateral oophorectomy.  EXAM: TRANSABDOMINAL AND TRANSVAGINAL ULTRASOUND OF PELVIS  TECHNIQUE: Both transabdominal and transvaginal ultrasound examinations of the pelvis were performed. Transabdominal technique was performed for global imaging of the pelvis including uterus, ovaries, adnexal regions, and pelvic cul-de-sac. It was necessary to proceed with endovaginal exam following the transabdominal exam to visualize the adnexal regions.  COMPARISON:  07/04/2013  FINDINGS: Uterus  Measurements: The uterus is surgically absent. The vaginal cuff has a normal appearance.  Endometrium  Thickness: The uterus is surgically absent.  Right ovary  Measurements: Status post oophorectomy. No adnexal mass  Left ovary  Measurements: Status post oophorectomy. No adnexal mass  Other findings  A small amount of free pelvic fluid is identified. The patient thinks she is scheduled for paracentesis later today.  IMPRESSION: 1. No evidence for pelvic mass. 2. Status post hysterectomy and bilateral salpingo-oophorectomy. 3. Small amount of free pelvic fluid.   Electronically Signed   By: Shon Hale M.D.   On: 11/28/2013 09:01   Ct Abdomen Pelvis W Contrast  11/28/2013   CLINICAL DATA:  Nausea and vomiting abdominal pain  EXAM: CT ABDOMEN AND PELVIS WITH CONTRAST  TECHNIQUE: Multidetector CT imaging of the abdomen and pelvis was performed using the standard protocol following bolus administration of intravenous contrast.  CONTRAST:  18m OMNIPAQUE IOHEXOL 300 MG/ML SOLN, 1024mOMNIPAQUE IOHEXOL 300 MG/ML SOLN  COMPARISON:  10/03/2013  FINDINGS: The lung bases are free of acute  infiltrate or sizable effusion.  The liver is diffusely decreased in attenuation consistent with fatty infiltration. The gallbladder, spleen, adrenal glands and pancreas are within normal limits. The kidneys are well visualized bilaterally. 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 Parapelvic cysts are seen. No renal calculi or urinary tract obstructive changes are noted.  Bladder is incompletely distended. There has been prior hysterectomy and oophorectomy. There are multiple dilated loops of small bowel identified with a transition zone deep within the pelvis in the distal ileum. There is considerable amount of inflammatory change and thickening of the mesenteric surrounding the transition zone. 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 A short segment of distal terminal ileum is noted prior  to the ascending colon. Small amount of ascites is seen. No acute bony abnormality is noted.  IMPRESSION: Changes consistent with small bowel obstruction in the distal ileum just before the terminal ileum. A considerable amount of mesenteric thickening is noted surrounding this region. These changes were present on the prior PET-CT.  Chronic changes as described above.   Electronically Signed   By: Inez Catalina M.D.   On: 11/28/2013 14:22   Dg Abd 2 Views  11/29/2013   CLINICAL DATA:  Bowel obstruction  EXAM: ABDOMEN - 2 VIEW  COMPARISON:  CT abdomen and pelvis November 28, 2013; abdominal radiograph November 28, 2013  FINDINGS: Supine and upright images were obtained. Nasogastric tube tip and side port are in the stomach. There is no longer bowel dilatation. No air-fluid levels. Contrast is present in the colon. No free air is seen. No bone lesions are appreciable. In there are phleboliths in the pelvis.  IMPRESSION: Bowel gas pattern now appears  unremarkable. Nasogastric tube tip and side port in stomach. No free air.   Electronically Signed   By: Lowella Grip M.D.   On: 11/29/2013 08:37    Labs: Results for orders placed during the hospital encounter of 11/28/13 (from the past 48 hour(s))  CBC     Status: None   Collection Time    11/30/13  5:43 AM      Result Value Ref Range   WBC 7.7  4.0 - 10.5 K/uL   RBC 4.81  3.87 - 5.11 MIL/uL   Hemoglobin 13.4  12.0 - 15.0 g/dL   HCT 42.2  36.0 - 46.0 %   MCV 87.7  78.0 - 100.0 fL   MCH 27.9  26.0 - 34.0 pg   MCHC 31.8  30.0 - 36.0 g/dL   RDW 15.0  11.5 - 15.5 %   Platelets 317  150 - 400 K/uL  BASIC METABOLIC PANEL     Status: Abnormal   Collection Time    11/30/13  5:43 AM      Result Value Ref Range   Sodium 139  137 - 147 mEq/L   Potassium 4.7  3.7 - 5.3 mEq/L   Chloride 103  96 - 112 mEq/L   CO2 26  19 - 32 mEq/L   Glucose, Bld 141 (*) 70 - 99 mg/dL   BUN 9  6 - 23 mg/dL   Creatinine, Ser 0.75  0.50 - 1.10 mg/dL   Calcium 9.1  8.4 - 10.5 mg/dL   GFR calc non Af Amer 82 (*) >90 mL/min   GFR calc Af Amer >90  >90 mL/min   Comment: (NOTE)     The eGFR has been calculated using the CKD EPI equation.     This calculation has not been validated in all clinical situations.     eGFR's persistently <90 mL/min signify possible Chronic Kidney     Disease.   Anion gap 10  5 - 15  CBC     Status: Abnormal   Collection Time    12/01/13  5:30 AM      Result Value Ref Range   WBC 14.1 (*) 4.0 - 10.5 K/uL   RBC 4.89  3.87 - 5.11 MIL/uL   Hemoglobin 13.5  12.0 - 15.0 g/dL   HCT 42.0  36.0 - 46.0 %   MCV 85.9  78.0 - 100.0 fL   MCH 27.6  26.0 - 34.0 pg   MCHC 32.1  30.0 - 36.0 g/dL  RDW 14.7  11.5 - 15.5 %   Platelets 334  150 - 400 K/uL  BASIC METABOLIC PANEL     Status: Abnormal   Collection Time    12/01/13  5:30 AM      Result Value Ref Range   Sodium 140  137 - 147 mEq/L   Potassium 4.4  3.7 - 5.3 mEq/L   Chloride 102  96 - 112 mEq/L   CO2 25  19 - 32 mEq/L    Glucose, Bld 125 (*) 70 - 99 mg/dL   BUN 11  6 - 23 mg/dL   Creatinine, Ser 0.75  0.50 - 1.10 mg/dL   Calcium 9.4  8.4 - 10.5 mg/dL   GFR calc non Af Amer 82 (*) >90 mL/min   GFR calc Af Amer >90  >90 mL/min   Comment: (NOTE)     The eGFR has been calculated using the CKD EPI equation.     This calculation has not been validated in all clinical situations.     eGFR's persistently <90 mL/min signify possible Chronic Kidney     Disease.   Anion gap 13  5 - 15    Assessment and Plan: SBO secondary to metastatic ovarian cancer Needs palliative decompressive gastrostomy tube. Explained IR placement. Procedure risks, complications, use of sedation to pt and family. Labs reviewed. Slight bump in WBC likely related to recent start on Decadron Hold Heparin tonight and in am. Check coags For procedure tomorrow. Consent signed in chart    I spent a total of 30 minutes face to face in clinical consultation/evaluation, greater than 50% of which was counseling/coordinating care for gastrostomy tube placement  Signed: Ascencion Dike 12/01/2013, 11:05 AM

## 2013-12-01 NOTE — Progress Notes (Signed)
[facility] will sign off.  Please call back if needed.   Rita Humphrey. Redmond Pulling, MD, FACS General, Bariatric, & Minimally Invasive Surgery [facility], [place]

## 2013-12-02 ENCOUNTER — Inpatient Hospital Stay (HOSPITAL_COMMUNITY): Payer: Medicare HMO

## 2013-12-02 DIAGNOSIS — K219 Gastro-esophageal reflux disease without esophagitis: Secondary | ICD-10-CM

## 2013-12-02 DIAGNOSIS — E43 Unspecified severe protein-calorie malnutrition: Secondary | ICD-10-CM

## 2013-12-02 LAB — PROTIME-INR
INR: 0.9 (ref 0.00–1.49)
PROTHROMBIN TIME: 12.2 s (ref 11.6–15.2)

## 2013-12-02 MED ORDER — AMLODIPINE BESYLATE 10 MG PO TABS
10.0000 mg | ORAL_TABLET | Freq: Every day | ORAL | Status: DC
  Administered 2013-12-02 – 2013-12-03 (×2): 10 mg via ORAL
  Filled 2013-12-02 (×3): qty 1

## 2013-12-02 MED ORDER — FENTANYL CITRATE 0.05 MG/ML IJ SOLN
INTRAMUSCULAR | Status: AC | PRN
  Administered 2013-12-02: 25 ug via INTRAVENOUS
  Administered 2013-12-02: 50 ug via INTRAVENOUS

## 2013-12-02 MED ORDER — GLUCAGON HCL RDNA (DIAGNOSTIC) 1 MG IJ SOLR
INTRAMUSCULAR | Status: AC
  Filled 2013-12-02: qty 1

## 2013-12-02 MED ORDER — DIPHENHYDRAMINE HCL 50 MG/ML IJ SOLN
12.5000 mg | Freq: Four times a day (QID) | INTRAMUSCULAR | Status: DC | PRN
Start: 2013-12-02 — End: 2013-12-03
  Administered 2013-12-02: 25 mg via INTRAVENOUS
  Filled 2013-12-02: qty 1

## 2013-12-02 MED ORDER — LIDOCAINE-EPINEPHRINE (PF) 2 %-1:200000 IJ SOLN
INTRAMUSCULAR | Status: AC
  Filled 2013-12-02: qty 20

## 2013-12-02 MED ORDER — DULOXETINE HCL 60 MG PO CPEP
60.0000 mg | ORAL_CAPSULE | Freq: Every day | ORAL | Status: DC
  Administered 2013-12-02 – 2013-12-03 (×2): 60 mg via ORAL
  Filled 2013-12-02 (×2): qty 1

## 2013-12-02 MED ORDER — IOHEXOL 300 MG/ML  SOLN
10.0000 mL | Freq: Once | INTRAMUSCULAR | Status: AC | PRN
  Administered 2013-12-02: 15 mL

## 2013-12-02 MED ORDER — GLUCAGON HCL RDNA (DIAGNOSTIC) 1 MG IJ SOLR
INTRAMUSCULAR | Status: AC | PRN
  Administered 2013-12-02: 1 mg via INTRAVENOUS

## 2013-12-02 MED ORDER — MIDAZOLAM HCL 2 MG/2ML IJ SOLN
INTRAMUSCULAR | Status: AC
  Filled 2013-12-02: qty 6

## 2013-12-02 MED ORDER — FENTANYL CITRATE 0.05 MG/ML IJ SOLN
INTRAMUSCULAR | Status: AC
  Filled 2013-12-02: qty 6

## 2013-12-02 MED ORDER — MIDAZOLAM HCL 2 MG/2ML IJ SOLN
INTRAMUSCULAR | Status: AC | PRN
  Administered 2013-12-02: 0.5 mg via INTRAVENOUS
  Administered 2013-12-02: 1 mg via INTRAVENOUS
  Administered 2013-12-02 (×4): 0.5 mg via INTRAVENOUS

## 2013-12-02 NOTE — Procedures (Signed)
Successful fluoroscopic guided insertion of gastrostomy tube without immediate post procedural complicatoin.   The gastrostomy tube may be used immediately for medications.  Otherwise, place gastrostomy tube to low wall suction for 24 hrs.  Tube feeds may be initiated in 24 hours as per the primary team.   

## 2013-12-02 NOTE — Progress Notes (Addendum)
Progress Note   HONEST SAFRANEK EPP:[id number] DOB: 1940/09/23 DOA: 11/28/2013 PCP: Delphina Cahill, MD   Brief Narrative:   Rita Humphrey is an 73 y.o. female with a PMH of ovarian cancer/mesenteric metastasis under the care of Dr. Skeet Latch, status post first cycle of chemotherapy with evidence of disease progression, recent hospitalization for SBO 10/30/13-10/31/13, evaluated by Dr. Skeet Latch with abdominal/pelvic ultrasound and abdominal CT scan showing recurrent SBO who was referred for admission 11/28/13. She has been seen by general surgery with recommendations for a palliative G-tube for ongoing gastric decompression, which is scheduled to be placed 12/02/13.  Assessment/Plan:   Principal Problem:   SBO (small bowel obstruction) with nausea and vomiting secondary to ovarian cancer with omental metastasis receiving palliative care  Dr. Skeet Latch aware of admission. Poor prognosis. She has been seen by the palliative care team with plans for palliative G-tube placement for ongoing gastric decompression and discharged home with hospice care.  NG tube out. Hook G-tube up to suction when necessary nausea. Advance diet to clear liquids.  Last BM 12/01/13.  Continue Decadron, when necessary fentanyl for pain control.  Active Problems:   HTN (hypertension)  Resume Norvasc.    Peripheral neuropathy, secondary to chemotherapy  Resume Cymbalta.    History of breast cancer    GERD without esophagitis  Continue IV PPI therapy.    Protein-calorie malnutrition, severe   Pt meets criteria for severe MALNUTRITION in the context of chronic illness as evidenced by PO intake < 75% for > one month, 5.3% body weight loss in one month.  Seen by dietitian 11/29/13. Will advance diet as tolerated for comfort feeds after a palliative G-tube placed.    DVT Prophylaxis  Continue heparin.  Code Status: DNR Family Communication: Daughters updated at the bedside. Disposition Plan: Home when  stable.   IV Access:    Port-A-Cath   Procedures and diagnostic studies:   Dg Abd 1 View 11/28/2013: Nasogastric tube tip overlying the level of the stomach.  Small bowel obstruction.     Ct Abdomen Pelvis W Contrast 11/28/2013: Changes consistent with small bowel obstruction in the distal ileum just before the terminal ileum. A considerable amount of mesenteric thickening is noted surrounding this region. These changes were present on the prior PET-CT.  Chronic changes as described above.    Dg Abd 2 Views 11/29/2013: Bowel gas pattern now appears unremarkable. Nasogastric tube tip and side port in stomach. No free air.    Medical Consultants:    Dr. Michael Boston, Surgery.  IR  Vinie Sill, NP, Palliative Care  Dr. Everitt Amber, GYN Oncology   Other Consultants:    Dietitian   Anti-Infectives:    None.  Subjective:   Rita Humphrey has passed flatus and reports that her bowels moved yesterday. The NG tube is out and she now has a palliative G-tube in place. No current complaints of nausea. No current complaints of pain.  Objective:    Filed Vitals:   12/01/13 0500 12/01/13 1330 12/01/13 2111 12/02/13 0557  BP: 129/59 154/82 129/87 131/74  Pulse: 84 88 72 89  Temp: 98.1 F (36.7 C) 99.8 F (37.7 C) 98.6 F (37 C) 98.1 F (36.7 C)  TempSrc: Oral Oral Oral Oral  Resp: 16 18 16 15   Height:      Weight: 85.73 kg (189 lb)   85.276 kg (188 lb)  SpO2: 94% 95% 98% 99%    Intake/Output Summary (Last 24 hours) at 12/02/13 1003  Last data filed at 12/02/13 0558  Gross per 24 hour  Intake 133.33 ml  Output   1225 ml  Net -1091.67 ml    Exam: Gen:  NAD Cardiovascular:  RRR, No M/R/G Respiratory:  Lungs CTAB Gastrointestinal:  Abdomen soft, NT/ND, + BS, G-tube in place left upper quadrant Extremities:  No C/E/C   Data Reviewed:    Labs: Basic Metabolic Panel:  Recent Labs Lab 11/28/13 1201 11/29/13 0549 11/30/13 0543 12/01/13 0530  NA 139 140 139  140  K 3.8 4.1 4.7 4.4  CL 95* 105 103 102  CO2 30 25 26 25   GLUCOSE 121* 100* 141* 125*  BUN 21 13 9 11   CREATININE 0.99 0.81 0.75 0.75  CALCIUM 9.8 8.7 9.1 9.4   GFR Estimated Creatinine Clearance: 64.9 ml/min (by C-G formula based on Cr of 0.75). Liver Function Tests:  Recent Labs Lab 11/28/13 1201  AST 21  ALT 28  ALKPHOS 95  BILITOT 0.7  PROT 7.6  ALBUMIN 3.8    Recent Labs Lab 11/28/13 1201  LIPASE 13   Coagulation profile  Recent Labs Lab 12/02/13 0640  INR 0.90    CBC:  Recent Labs Lab 11/28/13 1201 11/29/13 0549 11/30/13 0543 12/01/13 0530  WBC 10.6* 8.3 7.7 14.1*  NEUTROABS 7.2  --   --   --   HGB 14.8 12.8 13.4 13.5  HCT 46.2* 40.5 42.2 42.0  MCV 86.7 87.7 87.7 85.9  PLT 360 310 317 334   Sepsis Labs:  Recent Labs Lab 11/28/13 1201 11/28/13 1218 11/29/13 0549 11/30/13 0543 12/01/13 0530  WBC 10.6*  --  8.3 7.7 14.1*  LATICACIDVEN  --  1.09  --   --   --    Microbiology No results found for this or any previous visit (from the past 240 hour(s)).   Medications:   . bisacodyl  10 mg Rectal Daily  . clotrimazole  1 Applicatorful Vaginal QHS  . dexamethasone  4 mg Intravenous Q12H  . fentaNYL      . heparin  5,000 Units Subcutaneous 3 times per day  . lidocaine-EPINEPHrine      . lip balm  1 application Topical BID  . midazolam      . ondansetron  4 mg Intravenous Q6H  . pantoprazole (PROTONIX) IV  40 mg Intravenous Q12H  . vancomycin  1,000 mg Intravenous On Call   Continuous Infusions: . dextrose 5 % and 0.45 % NaCl with KCl 20 mEq/L 50 mL/hr at 12/01/13 1120    Time spent: 35 minutes with > 50% of time discussing current diagnostic test results, clinical impression and plan of care.    LOS: 4 days   [facility] Pager [phone]. If unable to reach me by pager, please call my cell phone at [phone].  *Please refer to [email or web address], password [email or web address] to get updated schedule on who will round on this  patient, as hospitalists switch teams weekly. If 7PM-7AM, please contact night-coverage at [email or web address], password [email or web address] for any overnight needs.  12/02/2013, 10:03 AM

## 2013-12-02 NOTE — Sedation Documentation (Signed)
NG tube removed by Lambert Mody, RRT per request Dr. Pascal Lux.  Pt tolerated well.

## 2013-12-02 NOTE — Progress Notes (Deleted)
CSW consulted to assist with d/c planning. Spoke with Dr. Sheran Fava this am. to review pt's d/c needs. Informed that Palliative Care Team is following and further recommendations are pending. CSW will review PN when available and assist as needed.  Werner Lean LCSW [phone]

## 2013-12-02 NOTE — Progress Notes (Signed)
Progress Note from the Palliative Medicine Team at [facility]: I met today with Rita Humphrey and daughters Rita Humphrey and Rita Humphrey. We discussed, with the help of Dr. Rockne Menghini, the purpose of her newly inserted G tube and increasing diet slowly with liquids starting tomorrow. Discussed drainage and suction options for the G tube according to her nausea and toleration of fluids which may vary from day to day. Discussed clamping of the tube for medications if it is kept to suction or gravity drainage. Ms. Rita Humphrey tells me she is anxious to get home and Rita Humphrey tells me that she is moving in with her and will be there with her 24/7. They confirm plan for home with hospice and I discussed with daughters what hospice will provide at home. She tells me her nausea is better and pain has been better with heating pad. Daughters tell me her pain/nausea was worse last night and I encourage Ms. Rita Humphrey not to wait to get pain medication until the pain is out of control as it will not be as effective and may take longer to get pain tolerable.     Objective: Allergies  Allergen Reactions  . Gabapentin Diarrhea  . Penicillins Anaphylaxis and Nausea And Vomiting  . Carboplatin   . Morphine And Related Nausea And Vomiting   Scheduled Meds: . bisacodyl  10 mg Rectal Daily  . clotrimazole  1 Applicatorful Vaginal QHS  . dexamethasone  4 mg Intravenous Q12H  . heparin  5,000 Units Subcutaneous 3 times per day  . lidocaine-EPINEPHrine      . lip balm  1 application Topical BID  . ondansetron  4 mg Intravenous Q6H  . pantoprazole (PROTONIX) IV  40 mg Intravenous Q12H   Continuous Infusions: . dextrose 5 % and 0.45 % NaCl with KCl 20 mEq/L 50 mL/hr at 12/01/13 1120   PRN Meds:.acetaminophen, alum & mag hydroxide-simeth, diphenhydrAMINE, fentaNYL, guaiFENesin-dextromethorphan, hydrALAZINE, lidocaine-prilocaine, magic mouthwash, menthol-cetylpyridinium, metoprolol, ondansetron (ZOFRAN) IV, ondansetron (ZOFRAN) IV,  phenol, polyethylene glycol, promethazine  BP 120/60  Pulse 79  Temp(Src) 97.2 F (36.2 C) (Oral)  Resp 16  Ht _0  (1.6 m)  Wt 85.276 kg (188 lb)  BMI 33.31 kg/m2  SpO2 94%   PPS: 30%   Intake/Output Summary (Last 24 hours) at 12/02/13 1245 Last data filed at 12/02/13 1000  Gross per 24 hour  Intake 133.33 ml  Output    875 ml  Net -741.67 ml      LBM: 11/30/13      Physical Exam:  General: NAD, resting in bed  HEENT: [place]/AT, no JVD, moist mucous membranes  Chest: No labored breathing, symmetric  CVS: RRR, S1 S2  Abdomen: Soft, NT, ND, +BS  Ext: Trace BLE edema, warm to touch, no cyanosis  Neuro: Awake, alert, oriented x 3, follows commands   Labs: CBC    Component Value Date/Time   WBC 14.1* 12/01/2013 0530   WBC 11.9* 10/30/2013 1004   RBC 4.89 12/01/2013 0530   RBC 5.50* 10/30/2013 1004   HGB 13.5 12/01/2013 0530   HGB 15.1 10/30/2013 1004   HCT 42.0 12/01/2013 0530   HCT 47.1* 10/30/2013 1004   PLT 334 12/01/2013 0530   PLT 358 10/30/2013 1004   MCV 85.9 12/01/2013 0530   MCV 85.7 10/30/2013 1004   MCH 27.6 12/01/2013 0530   MCH 27.4 10/30/2013 1004   MCHC 32.1 12/01/2013 0530   MCHC 31.9 10/30/2013 1004   RDW 14.7 12/01/2013 0530   RDW 15.5*  10/30/2013 1004   LYMPHSABS 2.3 11/28/2013 1201   LYMPHSABS 1.8 10/30/2013 1004   MONOABS 1.0 11/28/2013 1201   MONOABS 0.6 10/30/2013 1004   EOSABS 0.2 11/28/2013 1201   EOSABS 0.0 10/30/2013 1004   BASOSABS 0.0 11/28/2013 1201   BASOSABS 0.1 10/30/2013 1004    BMET    Component Value Date/Time   NA 140 12/01/2013 0530   NA 141 10/30/2013 1004   K 4.4 12/01/2013 0530   K 4.3 10/30/2013 1004   CL 102 12/01/2013 0530   CO2 25 12/01/2013 0530   CO2 27 10/30/2013 1004   GLUCOSE 125* 12/01/2013 0530   GLUCOSE 132 10/30/2013 1004   BUN 11 12/01/2013 0530   BUN 14.3 10/30/2013 1004   CREATININE 0.75 12/01/2013 0530   CREATININE 0.9 10/30/2013 1004   CALCIUM 9.4 12/01/2013 0530   CALCIUM 9.8 10/30/2013 1004   GFRNONAA 82* 12/01/2013 0530    GFRAA >90 12/01/2013 0530    CMP     Component Value Date/Time   NA 140 12/01/2013 0530   NA 141 10/30/2013 1004   K 4.4 12/01/2013 0530   K 4.3 10/30/2013 1004   CL 102 12/01/2013 0530   CO2 25 12/01/2013 0530   CO2 27 10/30/2013 1004   GLUCOSE 125* 12/01/2013 0530   GLUCOSE 132 10/30/2013 1004   BUN 11 12/01/2013 0530   BUN 14.3 10/30/2013 1004   CREATININE 0.75 12/01/2013 0530   CREATININE 0.9 10/30/2013 1004   CALCIUM 9.4 12/01/2013 0530   CALCIUM 9.8 10/30/2013 1004   PROT 7.6 11/28/2013 1201   ALBUMIN 3.8 11/28/2013 1201   AST 21 11/28/2013 1201   ALT 28 11/28/2013 1201   ALKPHOS 95 11/28/2013 1201   BILITOT 0.7 11/28/2013 1201   GFRNONAA 82* 12/01/2013 0530   GFRAA >90 12/01/2013 0530    Assessment and Plan: 1. Code Status: DNR 2. Symptom Control:  1. Malignant bowel obstruction: Continue Decadron 4 mg every 12 hours. Consider octreotide infusion/SQ and/or haldol if needed to help decrease secretions and decrease nausea symptoms. Venting G-tube placed today.  2. Pain: Fentanyl 50 mcg every 2 hours prn. Consider initiating oxycodone intensol 5 mg prn hourly. K-pad to abdomen.  3. Cough: Robitussin DM prn.  4. Sleep/Itching: Benadryl prn. Consider restarting Restoril for sleep.  5. Bowel Regimen: Dulcolax daily.  6. Fever: Acetaminophen supp prn.  7. Nausea/Vomiting: Ondansetron every 6 hours. Phenergan 6.25-12.5 mg every 4 hours prn.  3. Psycho/Social: Emotional support provided to patient and family. 4. Disposition: Home with hospice.    Time In Time Out Total Time Spent with Patient Total Overall Time  1150 1230 81mn 479m    Greater than 50%  of this time was spent counseling and coordinating care related to the above assessment and plan.  AlVinie SillNP Palliative Medicine Team Pager # [phone]-F 8a-5p) Team Phone # [phone]/Weekends)   1

## 2013-12-03 ENCOUNTER — Encounter: Payer: Self-pay | Admitting: Genetic Counselor

## 2013-12-03 ENCOUNTER — Telehealth: Payer: Self-pay | Admitting: Gynecologic Oncology

## 2013-12-03 DIAGNOSIS — C50919 Malignant neoplasm of unspecified site of unspecified female breast: Secondary | ICD-10-CM

## 2013-12-03 DIAGNOSIS — R1114 Bilious vomiting: Secondary | ICD-10-CM

## 2013-12-03 DIAGNOSIS — Z803 Family history of malignant neoplasm of breast: Secondary | ICD-10-CM

## 2013-12-03 DIAGNOSIS — C569 Malignant neoplasm of unspecified ovary: Secondary | ICD-10-CM

## 2013-12-03 MED ORDER — HEPARIN SOD (PORK) LOCK FLUSH 100 UNIT/ML IV SOLN
500.0000 [IU] | INTRAVENOUS | Status: AC | PRN
  Administered 2013-12-03: 500 [IU]
  Filled 2013-12-03: qty 5

## 2013-12-03 MED ORDER — BISACODYL 10 MG RE SUPP: 10.0000 mg | Freq: Every day | RECTAL | Status: AC | PRN

## 2013-12-03 MED ORDER — DEXAMETHASONE 4 MG PO TABS: 4.0000 mg | ORAL_TABLET | Freq: Two times a day (BID) | ORAL | Status: AC

## 2013-12-03 MED ORDER — ONDANSETRON 8 MG PO TBDP: 8.0000 mg | ORAL_TABLET | Freq: Three times a day (TID) | ORAL | Status: AC | PRN

## 2013-12-03 MED ORDER — HYDROMORPHONE HCL 2 MG PO TABS
2.0000 mg | ORAL_TABLET | ORAL | Status: DC | PRN
Start: 2013-12-03 — End: 2013-12-31

## 2013-12-03 NOTE — Discharge Summary (Signed)
Physician Discharge Summary  Rita Humphrey XBL:[id number] DOB: 1940-08-31 DOA: 11/28/2013  PCP: Delphina Cahill, MD  Admit date: 11/28/2013 Discharge date: 12/03/2013   Recommendations for Outpatient Follow-Up:   1. The patient is being discharged home with hospice services. 2. Status post G-tube placement which may be hooked up to low intermittent suction or drained to gravity as needed for recurrent problems with nausea/vomiting.   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  Diet recommendation: Liquids, regular.   History of Present Illness:   Rita Humphrey is an 73 y.o. female with a PMH of ovarian cancer/mesenteric metastasis under the care of Dr. Skeet Latch, status post first cycle of chemotherapy with evidence of disease progression, recent hospitalization for SBO 10/30/13-10/31/13, evaluated by Dr. Skeet Latch with abdominal/pelvic ultrasound and abdominal CT scan showing recurrent SBO who was referred for admission 11/28/13. She has been seen by general surgery with recommendations for a palliative G-tube for ongoing gastric decompression, which was placed 12/02/13.  Hospital Course by Problem:   Principal Problem:  SBO (small bowel obstruction) with nausea and vomiting secondary to ovarian cancer with omental metastasis receiving palliative care  Dr. Skeet Latch aware of admission. Poor prognosis. She has been seen by the palliative care team and will be discharged home with hospice care.  Status post placement of a gastrostomy tube  12/02/13 for gastric decompression as needed.  Active Problems:  HTN (hypertension)  Resume Norvasc. Patient was instructed to clamp her G-tube for one hour after she takes this medication.  Peripheral neuropathy, secondary to chemotherapy  Resume Cymbalta. Patient was instructed to clamp her G-tube for one hour after she takes this medication.  History of breast cancer   GERD without esophagitis  Treated with IV PPI therapy while in the hospital.  Protein-calorie malnutrition, severe  Pt meets criteria for severe MALNUTRITION in the context of chronic illness as evidenced by PO intake < 75% for > one month, 5.3% body weight loss in one month.  Seen by dietitian 11/29/13. Patient instructed to drink nutritional supplements as tolerated..   Medical Consultants:    Vinie Sill, NP, Palliative Care.  Dr. Michael Boston, Surgery.   Dr. Sandi Mariscal, IR  Dr. Everitt Amber, GYN Oncology   Discharge Exam:   Filed Vitals:   12/03/13 0535  BP: 134/60  Pulse: 65  Temp: 97.8 F (36.6 C)  Resp: 16   Filed Vitals:   12/02/13 1239 12/02/13 1332 12/02/13 2045 12/03/13 0535  BP: 132/73 150/67 135/63 134/60  Pulse: 72 67 73 65  Temp: 98 F (36.7 C) 98 F (36.7 C) 98.9 F (37.2 C) 97.8 F (36.6 C)  TempSrc: Oral Oral Oral Oral  Resp: 16 14 16 16   Height:      Weight:    81.3 kg (179 lb 3.7 oz)  SpO2: 94% 95% 96% 93%    Gen:  NAD Cardiovascular:  RRR, No M/R/G Respiratory: Lungs CTAB Gastrointestinal: Abdomen soft, NT/ND with normal active bowel sounds. +PEG LUQ Extremities: No C/E/C  The results of significant diagnostics from this hospitalization (including imaging, microbiology, ancillary and laboratory) are listed below for reference.     Procedures and Diagnostic Studies:   Dg Abd 1 View 11/28/2013: Nasogastric tube tip overlying the level of the stomach. Small bowel obstruction.   Ct Abdomen Pelvis W Contrast 11/28/2013: Changes consistent with small bowel  obstruction in the distal ileum just before the terminal ileum. A considerable amount of mesenteric thickening is noted surrounding this region. These changes were present on the prior PET-CT. Chronic changes as described above.   Dg Abd 2 Views 11/29/2013: Bowel gas pattern now appears unremarkable. Nasogastric tube tip and side port in stomach. No free air.   Placement of G-tube 12/02/13  Labs:   Basic Metabolic Panel:  Recent Labs Lab 11/28/13 1201 11/29/13 0549 11/30/13 0543 12/01/13 0530  NA 139 140 139 140  K 3.8 4.1 4.7 4.4  CL 95* 105 103 102  CO2 30 25 26 25   GLUCOSE 121* 100* 141* 125*  BUN 21 13 9 11   CREATININE 0.99 0.81 0.75 0.75  CALCIUM 9.8 8.7 9.1 9.4   GFR Estimated Creatinine Clearance: 63.3 ml/min (by C-G formula based on Cr of 0.75). Liver Function Tests:  Recent Labs Lab 11/28/13 1201  AST 21  ALT 28  ALKPHOS 95  BILITOT 0.7  PROT 7.6  ALBUMIN 3.8    Recent Labs Lab 11/28/13 1201  LIPASE 13   Coagulation profile  Recent Labs Lab 12/02/13 0640  INR 0.90    CBC:  Recent Labs Lab 11/28/13 1201 11/29/13 0549 11/30/13 0543 12/01/13 0530  WBC 10.6* 8.3 7.7 14.1*  NEUTROABS 7.2  --   --   --   HGB 14.8 12.8 13.4 13.5  HCT 46.2* 40.5 42.2 42.0  MCV 86.7 87.7 87.7 85.9  PLT 360 310 317 334     Discharge Instructions:   Discharge Instructions   Call MD for:  persistant nausea and vomiting    Complete by:  As directed      Call MD for:  severe uncontrolled pain    Complete by:  As directed      Call MD for:  temperature >100.4    Complete by:  As directed      Diet general    Complete by:  As directed   Liquids and soft starchy foods as tolerated.     Discharge instructions    Complete by:  As directed   You were cared for by Dr. Jacquelynn Cree  (a hospitalist) during your hospital stay. If you have any questions about your discharge medications or the care you received while you were in the hospital after you are discharged,  you can call the unit and ask to speak with the hospitalist on call if the hospitalist that took care of you is not available. Once you are discharged, your primary care physician will handle any further medical issues. Please note that NO REFILLS for any discharge medications will be authorized once you are discharged, as it is imperative that you return to your primary care physician (or establish a relationship with a primary care physician if you do not have one) for your aftercare needs so that they can reassess your need for medications and monitor your lab values.  Any outstanding tests can be reviewed by your PCP at your follow up visit.  It is also important to review any medicine changes with your PCP.  Please bring these d/c instructions with you to  your next visit so your physician can review these changes with you.  If you do not have a primary care physician, you can call [phone] for a physician referral.  It is highly recommended that you obtain a PCP for hospital follow up.     Increase activity slowly    Complete by:  As directed             Medication List    STOP taking these medications       docusate sodium 100 MG capsule  Commonly known as:  COLACE     oxyCODONE-acetaminophen 5-325 MG per tablet  Commonly known as:  PERCOCET/ROXICET     polyethylene glycol packet  Commonly known as:  MIRALAX / GLYCOLAX     potassium chloride 10 MEQ tablet  Commonly known as:  K-DUR,KLOR-CON      TAKE these medications       amLODipine 10 MG tablet  Commonly known as:  NORVASC  Take 10 mg by mouth daily before breakfast.     bisacodyl 10 MG suppository  Commonly known as:  DULCOLAX  Place 1 suppository (10 mg total) rectally daily as needed for moderate constipation.     dexamethasone 4 MG tablet  Commonly known as:  DECADRON  Take 1 tablet (4 mg total) by mouth 2 (two) times daily with a meal.     DULoxetine 30 MG capsule  Commonly known as:  CYMBALTA  Take 60 mg by  mouth daily.     fluticasone 50 MCG/ACT nasal spray  Commonly known as:  FLONASE  Place 2 sprays into both nostrils daily.     HYDROmorphone 2 MG tablet  Commonly known as:  DILAUDID  Take 1 tablet (2 mg total) by mouth every 4 (four) hours as needed for severe pain.     ondansetron 8 MG disintegrating tablet  Commonly known as:  ZOFRAN-ODT  Take 1 tablet (8 mg total) by mouth every 8 (eight) hours as needed for nausea or vomiting.     pantoprazole 40 MG tablet  Commonly known as:  PROTONIX  Take 40 mg by mouth daily.     prochlorperazine 10 MG tablet  Commonly known as:  COMPAZINE  Take 10 mg by mouth every 6 (six) hours as needed for nausea or vomiting.           Follow-up Information   Schedule an appointment as soon as possible for a visit with Delphina Cahill, MD. (As needed)    Specialty:  Internal Medicine   Contact information:    [place] [id number] [phone]        Time coordinating discharge: 35 minutes.  Signed:  Icela Glymph  Pager [phone] [facility] 12/03/2013, 1:49 PM

## 2013-12-03 NOTE — Care Management Note (Signed)
CARE MANAGEMENT NOTE 12/03/2013  Patient:  Rita Humphrey, Rita Humphrey   Account Number:  [id number]  Date Initiated:  12/03/2013  Documentation initiated by:  Marney Doctor  Subjective/Objective Assessment:   73 yo female admitted with SBO with hx of  ovarian cancer/mesenteric metastasis     Action/Plan:   From home alone   Anticipated DC Date:  12/03/2013   Anticipated DC Plan:  [place]  CM consult      Choice offered to / List presented to:  C-1 Patient           [place] agency  [facility]   Status of service:  Completed, signed off Medicare Important Message given?  YES (If response is "NO", the following Medicare IM given date fields will be blank) Date Medicare IM given:  12/03/2013 Medicare IM given by:  Marney Doctor Date Additional Medicare IM given:   Additional Medicare IM given by:    Discharge Disposition:    Per UR Regulation:    If discussed at Long Length of Stay Meetings, dates discussed:   12/03/2013    Comments:  12/03/13 Marney Doctor RN,BSN,NCM Pt wants to go home with hospice care.  She will be living with her daugter.  Choice was offered to her and her daughters.  They had used [facility] in the past with another family member and would like to use them again.  Referral called to agency and information faxed to agency.  Pt ok to DC home via car with daughters.  Per RN no equipment is needed for pt to dc to home.  Pt and family appreciative of CM assistance.

## 2013-12-03 NOTE — Progress Notes (Signed)
Pt. Has been discharged home with hospice services.  Pt. Was given her discharge instructions, prescriptions, and all questions were answered.  Pt. Is to be transported home by family.

## 2013-12-03 NOTE — Progress Notes (Signed)
HPI:  Ms. Seder was previously seen in the [facility] clinic due to a personal and family history of cancer and concerns regarding a hereditary predisposition to cancer. Please refer to our prior cancer genetics clinic note for more information regarding Ms. Kolker's medical, social and family histories, and our assessment and recommendations, at the time. Ms. Houchen was discharged to home with Hospice care today and thus, Ms. Christon's recent genetic test results were disclosed to her daughter, Lattie Haw, and stepdaughter, Brandon, as were recommendations warranted by these results. These results and recommendations are discussed in more detail below.  GENETIC TEST RESULTS: At the time of Ms. Mccutchan's visit, we recommended she pursue genetic testing of the OvaNext gene panel. This test, which included sequencing and deletion/duplication analysis of the genes, was performed at [facility]. Genetic testing was normal, and did not reveal a deleterious mutation in these genes. A complete list of all genes tested is located on the test report scanned into EPIC.    We discussed with Ms. Whisner's daughter and stepdaughter that since the current genetic testing is not perfect, it is possible there may be a gene mutation in one of these genes that current testing cannot detect, but that chance is small.  We also discussed, that it is possible that another gene that has not yet been discovered, or that we have not yet tested, is responsible for the cancer diagnoses in the family, and it is, therefore, important for the family to remain in touch with cancer genetics in the future so that we can continue to offer Ms. Schroll's family the most up to date genetic testing.   CANCER SCREENING RECOMMENDATIONS: This result is generally reassuring and suggests that Ms. Bookwalter's cancer was most likely not due to an inherited predisposition associated with one of these genes.  Most cancers happen by  chance and this negative test suggests that her cancer falls into this category.    RECOMMENDATIONS FOR FAMILY MEMBERS:  Women in this family might be at some increased risk of developing cancer, over the general population risk, simply due to the family history of cancer.  We recommended women in this family have a yearly mammogram beginning at age 25, an an annual clinical breast exam, and perform monthly breast self-exams. Women in this family should also have a gynecological exam as recommended by their primary provider. All family members should have a colonoscopy by age 68.  FOLLOW-UP: Lastly, we discussed with Ms. River's family that cancer genetics is a rapidly advancing field and it is possible that new genetic tests will be appropriate for them and/or their family members in the future. We encouraged them to remain in contact with cancer genetics on an annual basis so we can update their personal and family histories and let them know of advances in cancer genetics that may benefit this family.   Our contact number was provided. Ms. Chatwin duaghter and stepdaughter's questions were answered to their satisfaction, and they know they are welcome to call us at anytime with additional questions or concerns.   Catherine A. Fine, MS, CGC Certified Genetic Counseor [email or web address]

## 2013-12-03 NOTE — Telephone Encounter (Signed)
TC to daughter Carleen. G-tube is placed and patient is going home today on hospice. PG

## 2013-12-05 LAB — CA 125

## 2013-12-18 ENCOUNTER — Ambulatory Visit (HOSPITAL_COMMUNITY): Payer: Medicare HMO

## 2013-12-18 ENCOUNTER — Other Ambulatory Visit (HOSPITAL_COMMUNITY): Payer: Medicare HMO

## 2013-12-31 ENCOUNTER — Other Ambulatory Visit: Payer: Self-pay | Admitting: Gynecologic Oncology

## 2013-12-31 ENCOUNTER — Other Ambulatory Visit: Payer: Self-pay | Admitting: *Deleted

## 2013-12-31 ENCOUNTER — Encounter (HOSPITAL_COMMUNITY): Payer: Medicare HMO

## 2013-12-31 DIAGNOSIS — C569 Malignant neoplasm of unspecified ovary: Secondary | ICD-10-CM

## 2013-12-31 DIAGNOSIS — Z515 Encounter for palliative care: Secondary | ICD-10-CM

## 2013-12-31 MED ORDER — PANTOPRAZOLE SODIUM 40 MG PO TBEC: 40.0000 mg | DELAYED_RELEASE_TABLET | Freq: Every day | ORAL | Status: AC

## 2013-12-31 MED ORDER — HYDROMORPHONE HCL 2 MG PO TABS: 2.0000 mg | ORAL_TABLET | ORAL | Status: DC | PRN

## 2013-12-31 NOTE — Progress Notes (Signed)
Hospice called requesting refills for dilaudid PO and protonix.  Refills given and faxed.

## 2014-01-08 ENCOUNTER — Other Ambulatory Visit: Payer: Self-pay | Admitting: Gynecologic Oncology

## 2014-01-08 ENCOUNTER — Other Ambulatory Visit: Payer: Self-pay | Admitting: *Deleted

## 2014-01-08 ENCOUNTER — Telehealth: Payer: Self-pay | Admitting: *Deleted

## 2014-01-08 DIAGNOSIS — Z515 Encounter for palliative care: Secondary | ICD-10-CM

## 2014-01-08 DIAGNOSIS — C569 Malignant neoplasm of unspecified ovary: Secondary | ICD-10-CM

## 2014-01-08 MED ORDER — HYDROMORPHONE HCL 2 MG PO TABS: ORAL_TABLET | ORAL | Status: DC

## 2014-01-08 NOTE — Telephone Encounter (Signed)
Refill faxed to pt pharmacy. 

## 2014-01-08 NOTE — Telephone Encounter (Signed)
Call from [place], [place] at [facility] Pt requesting refill on dilaudid. RN also states pt doe snot have relief with 1 tablet (2 mg). Pt currelty takes 1 tablet at night only for pain.  Reviewed with NP, ok to increase medication to 2 tablets at bedtime for pain

## 2014-01-09 ENCOUNTER — Telehealth: Payer: Self-pay | Admitting: Gynecologic Oncology

## 2014-01-09 DIAGNOSIS — Z515 Encounter for palliative care: Secondary | ICD-10-CM

## 2014-01-09 DIAGNOSIS — G893 Neoplasm related pain (acute) (chronic): Secondary | ICD-10-CM

## 2014-01-09 MED ORDER — FENTANYL 12 MCG/HR TD PT72: 12.5000 ug | MEDICATED_PATCH | TRANSDERMAL | Status: DC

## 2014-01-09 NOTE — Telephone Encounter (Signed)
Case manager for Hospice called about patient.  Hospice contacted the office yesterday about increasing the dose of her dilaudid at bedtime because she was not sleeping, etc.  She reports the patient's daughters stating their mother was up all night after taking two 2 mg dilaudid tablets at bedtime and this am she was mean and belligerent.  Asking about possible brain metastasis or need for medication adjustment.  Agree with adjusting pain medications at this time to see if her symptoms improve or resolve.  Discussed with the case manager beginning low dose fentanyl patch and stopping hydromorphone.  Prescription will be sent in for the patient and the case manager is to call the family and discuss the new plan.  She also reported the patient had a significant bowel movement last week.

## 2014-01-13 ENCOUNTER — Telehealth: Payer: Self-pay | Admitting: *Deleted

## 2014-01-13 NOTE — Telephone Encounter (Signed)
Rita Reichmann, RN from [facility] called, pt urine is cloudy and concentrated, pt has had " change in her behavior". Verbal Order for UA given

## 2014-01-20 ENCOUNTER — Other Ambulatory Visit: Payer: Self-pay | Admitting: Gynecologic Oncology

## 2014-01-20 DIAGNOSIS — N390 Urinary tract infection, site not specified: Secondary | ICD-10-CM

## 2014-01-20 MED ORDER — SULFAMETHOXAZOLE-TRIMETHOPRIM 800-160 MG PO TABS: 1.0000 | ORAL_TABLET | Freq: Two times a day (BID) | ORAL | Status: DC

## 2014-01-20 NOTE — Progress Notes (Signed)
Bactrim initiated for UTI positive for E coli, klebsiella pneumoniae per Dr. Skeet Latch.  Hopsice RN notified, Jenny Reichmann, at [phone]

## 2014-01-21 ENCOUNTER — Encounter: Payer: Self-pay | Admitting: Hematology and Oncology

## 2014-01-22 ENCOUNTER — Other Ambulatory Visit: Payer: Self-pay | Admitting: Gynecologic Oncology

## 2014-01-22 DIAGNOSIS — C786 Secondary malignant neoplasm of retroperitoneum and peritoneum: Secondary | ICD-10-CM

## 2014-01-22 DIAGNOSIS — R1084 Generalized abdominal pain: Secondary | ICD-10-CM

## 2014-01-22 DIAGNOSIS — C569 Malignant neoplasm of unspecified ovary: Secondary | ICD-10-CM

## 2014-01-22 DIAGNOSIS — Z515 Encounter for palliative care: Secondary | ICD-10-CM

## 2014-01-22 MED ORDER — HYDROCODONE-ACETAMINOPHEN 5-325 MG PO TABS: 1.0000 | ORAL_TABLET | ORAL | Status: DC | PRN

## 2014-01-22 MED ORDER — FENTANYL 25 MCG/HR TD PT72: 25.0000 ug | MEDICATED_PATCH | TRANSDERMAL | Status: DC

## 2014-01-22 NOTE — Progress Notes (Signed)
Returned call to [employer], [place].  Stating the patient is starting to experience an increase in frequency and severity of her cancer-related pain.  Stating with the 12 mcg Fentanyl patch, the family was calling over the weekend stating the patient was still in moderate pain.  Fentanyl patch to be increased to 25 mcg every 72 hours and Norco (recommendation from [place]) 5/325 one every four to six hours as needed for breakthrough pain prescribed and faxed to [facility] in [place] since pt is Hospice pt.  Cindy to contact the patient's family to discuss the above and go over instructions for use.

## 2014-01-28 ENCOUNTER — Other Ambulatory Visit: Payer: Self-pay | Admitting: *Deleted

## 2014-01-28 ENCOUNTER — Other Ambulatory Visit: Payer: Self-pay | Admitting: Gynecologic Oncology

## 2014-01-28 DIAGNOSIS — C786 Secondary malignant neoplasm of retroperitoneum and peritoneum: Secondary | ICD-10-CM

## 2014-01-28 DIAGNOSIS — C569 Malignant neoplasm of unspecified ovary: Secondary | ICD-10-CM

## 2014-01-28 DIAGNOSIS — R1084 Generalized abdominal pain: Secondary | ICD-10-CM

## 2014-01-28 DIAGNOSIS — Z515 Encounter for palliative care: Secondary | ICD-10-CM

## 2014-01-28 MED ORDER — FENTANYL 25 MCG/HR TD PT72: 25.0000 ug | MEDICATED_PATCH | TRANSDERMAL | Status: DC

## 2014-01-28 NOTE — Telephone Encounter (Signed)
Call from [place], Fentanyl patch rx previously filled and picked up Q #5 per Hospice protocol. Rx's are only filled for 15 days at a time.Pt will need refill Dec2. Rx faxed to [facility] to be filled Dec 2 Q#5 patches.

## 2014-02-05 ENCOUNTER — Other Ambulatory Visit: Payer: Self-pay | Admitting: Gynecologic Oncology

## 2014-02-05 DIAGNOSIS — N390 Urinary tract infection, site not specified: Secondary | ICD-10-CM

## 2014-02-05 MED ORDER — SULFAMETHOXAZOLE-TRIMETHOPRIM 800-160 MG PO TABS: 1.0000 | ORAL_TABLET | Freq: Two times a day (BID) | ORAL | Status: AC

## 2014-02-11 ENCOUNTER — Telehealth: Payer: Self-pay | Admitting: Gynecologic Oncology

## 2014-02-11 ENCOUNTER — Encounter (HOSPITAL_COMMUNITY): Payer: Medicare HMO

## 2014-02-11 NOTE — Telephone Encounter (Signed)
Hospice RN called stating she saw the patient yesterday.  The daughter was reporting the patient was waking up during the night with severe left abdominal pain.  Repositioning would alleviate symptoms for a short period of time.  Taking one and half of the vicodin tablets offered little relief.  Hospice RN asking about increasing pain medication.  Stating that the patient is alert, oriented to self and place and that her sentences do not make sense.  Advised to have the daughter administer two tablets every six to eight hours as needed and to call the office in one to two days with an update.

## 2014-02-14 ENCOUNTER — Telehealth: Payer: Self-pay | Admitting: Gynecologic Oncology

## 2014-02-14 NOTE — Telephone Encounter (Signed)
Hospice RN called reporting pain is well controlled with taking 2 vicodin.  Patient is also having regular bowel movements daily and has intermittent constipation.  RN wanting to check about patient using miralax as needed.  Advised that miralax could be used as needed if the patient was not experiencing nausea, emesis, or abdominal pain.  She is also going to run another urine culture due to patient's continued confusion.

## 2014-02-20 ENCOUNTER — Other Ambulatory Visit: Payer: Self-pay | Admitting: Gynecologic Oncology

## 2014-02-20 DIAGNOSIS — C569 Malignant neoplasm of unspecified ovary: Secondary | ICD-10-CM

## 2014-02-20 DIAGNOSIS — C786 Secondary malignant neoplasm of retroperitoneum and peritoneum: Secondary | ICD-10-CM

## 2014-02-20 DIAGNOSIS — R1084 Generalized abdominal pain: Secondary | ICD-10-CM

## 2014-02-20 DIAGNOSIS — Z515 Encounter for palliative care: Secondary | ICD-10-CM

## 2014-02-20 MED ORDER — HYDROCODONE-ACETAMINOPHEN 5-325 MG PO TABS: 1.0000 | ORAL_TABLET | Freq: Four times a day (QID) | ORAL | Status: AC | PRN

## 2014-02-20 NOTE — Progress Notes (Signed)
Hospice RN called requesting refill on hydrocodone/APAP for patient.  Reporting the patient is tolerating the pain medication well and is receiving pain relief.  Refill faxed to [place].

## 2014-02-24 ENCOUNTER — Other Ambulatory Visit: Payer: Self-pay | Admitting: Gynecologic Oncology

## 2014-02-24 DIAGNOSIS — C786 Secondary malignant neoplasm of retroperitoneum and peritoneum: Secondary | ICD-10-CM

## 2014-02-24 DIAGNOSIS — R1084 Generalized abdominal pain: Secondary | ICD-10-CM

## 2014-02-24 DIAGNOSIS — Z515 Encounter for palliative care: Secondary | ICD-10-CM

## 2014-02-24 DIAGNOSIS — C569 Malignant neoplasm of unspecified ovary: Secondary | ICD-10-CM

## 2014-02-24 MED ORDER — FENTANYL 25 MCG/HR TD PT72: 25.0000 ug | MEDICATED_PATCH | TRANSDERMAL | Status: AC

## 2014-02-24 NOTE — Progress Notes (Signed)
Received phone call from [place], [place] with Hospice requesting refill on fentanyl patch for the patient.  Pain controlled with fentanyl patch and vicodin PRN.  No other concerns voiced.

## 2014-02-25 ENCOUNTER — Encounter: Payer: Self-pay | Admitting: Gynecologic Oncology

## 2014-02-26 ENCOUNTER — Encounter: Payer: Self-pay | Admitting: Gynecologic Oncology

## 2014-02-26 ENCOUNTER — Other Ambulatory Visit: Payer: Self-pay | Admitting: Gynecologic Oncology

## 2014-02-26 DIAGNOSIS — C569 Malignant neoplasm of unspecified ovary: Secondary | ICD-10-CM

## 2014-02-26 MED ORDER — FENTANYL 12 MCG/HR TD PT72: 12.5000 ug | MEDICATED_PATCH | TRANSDERMAL | Status: AC

## 2014-02-26 NOTE — Progress Notes (Signed)
Spoke with DeBary, Hospice RN.  Her number is [phone].  Stating the patient's pain is not controlled per her daughter Lattie Haw.  She reports her mother waking up in the night screaming.  The Hospice RN states the mother is in no distress when she visits during the day.  Wanting to increase the amount of her fentanyl patch.  Agreed to add a 12 mcg patch with her 25 mch patch already ordered for a total of 37 mcg.  RN also reporting a new level of confusion that started once the patient could not walk.  Stating she talks to people on television and is hallucinating at times.  Going forward, the patient's symptoms and pain will be managed by the Hospice Medical Director to offer better pain control and better access for the Hospice staff with getting prescriptions and orders.  New prescription faxed to [facility] in [place].

## 2014-04-07 DEATH — deceased

## 2015-08-21 ENCOUNTER — Other Ambulatory Visit: Payer: Self-pay | Admitting: Nurse Practitioner
